# Patient Record
Sex: Female | Born: 1950
Health system: Southern US, Community
[De-identification: ages and names within clinical notes are randomized; demographics above are authoritative.]

## PROBLEM LIST (undated history)

## (undated) DIAGNOSIS — E119 Type 2 diabetes mellitus without complications: Secondary | ICD-10-CM

## (undated) DIAGNOSIS — E785 Hyperlipidemia, unspecified: Secondary | ICD-10-CM

## (undated) DIAGNOSIS — T7840XA Allergy, unspecified, initial encounter: Secondary | ICD-10-CM

## (undated) DIAGNOSIS — I10 Essential (primary) hypertension: Secondary | ICD-10-CM

## (undated) DIAGNOSIS — K219 Gastro-esophageal reflux disease without esophagitis: Secondary | ICD-10-CM

## (undated) DIAGNOSIS — F419 Anxiety disorder, unspecified: Secondary | ICD-10-CM

## (undated) DIAGNOSIS — F32A Depression, unspecified: Secondary | ICD-10-CM

## (undated) HISTORY — DX: Gastro-esophageal reflux disease without esophagitis: K21.9

## (undated) HISTORY — DX: Type 2 diabetes mellitus without complications: E11.9

## (undated) HISTORY — DX: Essential (primary) hypertension: I10

## (undated) HISTORY — PX: EYE SURGERY: SHX253

## (undated) HISTORY — DX: Depression, unspecified: F32.A

## (undated) HISTORY — DX: Hyperlipidemia, unspecified: E78.5

## (undated) HISTORY — DX: Allergy, unspecified, initial encounter: T78.40XA

## (undated) HISTORY — DX: Anxiety disorder, unspecified: F41.9

---

## 1998-08-18 DIAGNOSIS — F324 Major depressive disorder, single episode, in partial remission: Secondary | ICD-10-CM | POA: Insufficient documentation

## 1998-08-18 DIAGNOSIS — F5101 Primary insomnia: Secondary | ICD-10-CM | POA: Insufficient documentation

## 1998-08-18 DIAGNOSIS — I10 Essential (primary) hypertension: Secondary | ICD-10-CM | POA: Insufficient documentation

## 1998-08-18 DIAGNOSIS — E1169 Type 2 diabetes mellitus with other specified complication: Secondary | ICD-10-CM | POA: Insufficient documentation

## 2001-06-17 ENCOUNTER — Other Ambulatory Visit: Admission: RE | Admit: 2001-06-17 | Discharge: 2001-06-17 | Payer: Self-pay | Admitting: Family Medicine

## 2004-09-19 ENCOUNTER — Ambulatory Visit: Payer: Self-pay | Admitting: Family Medicine

## 2005-09-22 ENCOUNTER — Ambulatory Visit: Payer: Self-pay | Admitting: Family Medicine

## 2006-03-04 ENCOUNTER — Ambulatory Visit: Payer: Self-pay | Admitting: Internal Medicine

## 2006-03-05 ENCOUNTER — Ambulatory Visit: Payer: Self-pay | Admitting: Internal Medicine

## 2006-03-09 ENCOUNTER — Ambulatory Visit: Payer: Self-pay

## 2006-10-21 ENCOUNTER — Ambulatory Visit: Payer: Self-pay | Admitting: Family Medicine

## 2008-01-25 ENCOUNTER — Ambulatory Visit: Payer: Self-pay | Admitting: Family Medicine

## 2008-08-18 DIAGNOSIS — M1990 Unspecified osteoarthritis, unspecified site: Secondary | ICD-10-CM

## 2008-08-18 HISTORY — DX: Unspecified osteoarthritis, unspecified site: M19.90

## 2009-04-04 DIAGNOSIS — A6000 Herpesviral infection of urogenital system, unspecified: Secondary | ICD-10-CM | POA: Insufficient documentation

## 2009-04-17 ENCOUNTER — Ambulatory Visit: Payer: Self-pay | Admitting: Family Medicine

## 2010-04-16 ENCOUNTER — Ambulatory Visit: Payer: Self-pay | Admitting: Family Medicine

## 2010-05-07 ENCOUNTER — Ambulatory Visit: Payer: Self-pay | Admitting: Family Medicine

## 2011-06-11 ENCOUNTER — Ambulatory Visit: Payer: Self-pay | Admitting: Family Medicine

## 2011-07-23 ENCOUNTER — Ambulatory Visit: Payer: Self-pay | Admitting: Gastroenterology

## 2011-07-23 LAB — HM COLONOSCOPY

## 2012-06-15 ENCOUNTER — Ambulatory Visit: Payer: Self-pay | Admitting: Family Medicine

## 2012-07-18 HISTORY — PX: HAND SURGERY: SHX662

## 2013-06-29 ENCOUNTER — Ambulatory Visit: Payer: Self-pay | Admitting: Family Medicine

## 2014-07-17 ENCOUNTER — Ambulatory Visit: Payer: Self-pay | Admitting: Family Medicine

## 2014-12-04 ENCOUNTER — Ambulatory Visit
Admit: 2014-12-04 | Disposition: A | Discharge status: Home / Self Care | Payer: Self-pay | Attending: Family Medicine | Admitting: Family Medicine

## 2015-04-30 ENCOUNTER — Telehealth: Payer: Self-pay | Admitting: Family Medicine

## 2015-04-30 DIAGNOSIS — I10 Essential (primary) hypertension: Secondary | ICD-10-CM

## 2015-04-30 DIAGNOSIS — M1990 Unspecified osteoarthritis, unspecified site: Secondary | ICD-10-CM | POA: Insufficient documentation

## 2015-04-30 DIAGNOSIS — K760 Fatty (change of) liver, not elsewhere classified: Secondary | ICD-10-CM | POA: Insufficient documentation

## 2015-04-30 DIAGNOSIS — M19041 Primary osteoarthritis, right hand: Secondary | ICD-10-CM | POA: Insufficient documentation

## 2015-04-30 DIAGNOSIS — R9431 Abnormal electrocardiogram [ECG] [EKG]: Secondary | ICD-10-CM | POA: Insufficient documentation

## 2015-04-30 DIAGNOSIS — N3944 Nocturnal enuresis: Secondary | ICD-10-CM | POA: Insufficient documentation

## 2015-04-30 DIAGNOSIS — E785 Hyperlipidemia, unspecified: Secondary | ICD-10-CM

## 2015-04-30 DIAGNOSIS — B009 Herpesviral infection, unspecified: Secondary | ICD-10-CM | POA: Insufficient documentation

## 2015-04-30 DIAGNOSIS — R32 Unspecified urinary incontinence: Secondary | ICD-10-CM | POA: Insufficient documentation

## 2015-04-30 DIAGNOSIS — F419 Anxiety disorder, unspecified: Secondary | ICD-10-CM | POA: Insufficient documentation

## 2015-04-30 DIAGNOSIS — F32A Depression, unspecified: Secondary | ICD-10-CM

## 2015-04-30 DIAGNOSIS — R319 Hematuria, unspecified: Secondary | ICD-10-CM | POA: Insufficient documentation

## 2015-04-30 DIAGNOSIS — J302 Other seasonal allergic rhinitis: Secondary | ICD-10-CM | POA: Insufficient documentation

## 2015-04-30 DIAGNOSIS — E119 Type 2 diabetes mellitus without complications: Secondary | ICD-10-CM | POA: Insufficient documentation

## 2015-04-30 DIAGNOSIS — F329 Major depressive disorder, single episode, unspecified: Secondary | ICD-10-CM

## 2015-04-30 DIAGNOSIS — R42 Dizziness and giddiness: Secondary | ICD-10-CM | POA: Insufficient documentation

## 2015-04-30 DIAGNOSIS — M19042 Primary osteoarthritis, left hand: Secondary | ICD-10-CM

## 2015-04-30 NOTE — Telephone Encounter (Signed)
Pt is requesting a lab slip to have yearly labs and check vitamin D.  ET#[phone]/[id number]

## 2015-04-30 NOTE — Telephone Encounter (Signed)
Ok to print out labs as previous. Thanks.

## 2015-04-30 NOTE — Telephone Encounter (Signed)
Patient advised to pick up lab slip at front office.  sd

## 2015-05-08 ENCOUNTER — Telehealth: Payer: Self-pay

## 2015-05-08 LAB — COMPREHENSIVE METABOLIC PANEL
A/G RATIO: 1.5 (ref 1.1–2.5)
ALBUMIN: 4.3 g/dL (ref 3.6–4.8)
ALK PHOS: 69 IU/L (ref 39–117)
ALT: 87 IU/L — ABNORMAL HIGH (ref 0–32)
AST: 57 IU/L — ABNORMAL HIGH (ref 0–40)
BILIRUBIN TOTAL: 0.8 mg/dL (ref 0.0–1.2)
BUN / CREAT RATIO: 13 (ref 11–26)
BUN: 11 mg/dL (ref 8–27)
CHLORIDE: 98 mmol/L (ref 97–108)
CO2: 25 mmol/L (ref 18–29)
Calcium: 9.6 mg/dL (ref 8.7–10.3)
Creatinine, Ser: 0.87 mg/dL (ref 0.57–1.00)
GFR calc Af Amer: 82 mL/min/{1.73_m2} (ref >59)
GFR calc non Af Amer: 71 mL/min/{1.73_m2} (ref >59)
GLOBULIN, TOTAL: 2.8 g/dL (ref 1.5–4.5)
Glucose: 109 mg/dL — ABNORMAL HIGH (ref 65–99)
POTASSIUM: 4.1 mmol/L (ref 3.5–5.2)
SODIUM: 139 mmol/L (ref 134–144)
Total Protein: 7.1 g/dL (ref 6.0–8.5)

## 2015-05-08 LAB — CBC WITH DIFFERENTIAL/PLATELET
Basophils Absolute: 0.1 10*3/uL (ref 0.0–0.2)
Basos: 1 %
EOS (ABSOLUTE): 0.3 10*3/uL (ref 0.0–0.4)
EOS: 4 %
HEMATOCRIT: 39.8 % (ref 34.0–46.6)
HEMOGLOBIN: 13.5 g/dL (ref 11.1–15.9)
Immature Grans (Abs): 0 10*3/uL (ref 0.0–0.1)
Immature Granulocytes: 0 %
LYMPHS ABS: 2.8 10*3/uL (ref 0.7–3.1)
Lymphs: 41 %
MCH: 30.1 pg (ref 26.6–33.0)
MCHC: 33.9 g/dL (ref 31.5–35.7)
MCV: 89 fL (ref 79–97)
MONOCYTES: 6 %
MONOS ABS: 0.4 10*3/uL (ref 0.1–0.9)
NEUTROS ABS: 3.3 10*3/uL (ref 1.4–7.0)
Neutrophils: 48 %
Platelets: 296 10*3/uL (ref 150–379)
RBC: 4.48 x10E6/uL (ref 3.77–5.28)
RDW: 13.4 % (ref 12.3–15.4)
WBC: 6.9 10*3/uL (ref 3.4–10.8)

## 2015-05-08 LAB — LIPID PANEL WITH LDL/HDL RATIO
Cholesterol, Total: 151 mg/dL (ref 100–199)
HDL: 51 mg/dL (ref >39)
LDL Calculated: 78 mg/dL (ref 0–99)
LDL/HDL RATIO: 1.5 ratio (ref 0.0–3.2)
Triglycerides: 109 mg/dL (ref 0–149)
VLDL Cholesterol Cal: 22 mg/dL (ref 5–40)

## 2015-05-08 LAB — HEMOGLOBIN A1C
Est. average glucose Bld gHb Est-mCnc: 146 mg/dL
HEMOGLOBIN A1C: 6.7 % — AB (ref 4.8–5.6)

## 2015-05-08 LAB — TSH: TSH: 2.08 u[IU]/mL (ref 0.450–4.500)

## 2015-05-08 LAB — VITAMIN D 25 HYDROXY (VIT D DEFICIENCY, FRACTURES): VIT D 25 HYDROXY: 29.3 ng/mL — AB (ref 30.0–100.0)

## 2015-05-08 NOTE — Telephone Encounter (Signed)
Pt advised; she has an appointment tomorrow.   Thanks,   -Mickel Baas

## 2015-05-08 NOTE — Telephone Encounter (Signed)
-----   Message from Margarita Rana, MD sent at 05/08/2015  7:33 AM EDT ----- Needs ov to address labs. Thanks.

## 2015-05-09 ENCOUNTER — Encounter: Payer: Self-pay | Admitting: Family Medicine

## 2015-05-09 ENCOUNTER — Ambulatory Visit (INDEPENDENT_AMBULATORY_CARE_PROVIDER_SITE_OTHER): Admitting: Family Medicine

## 2015-05-09 VITALS — BP 148/82 | HR 80 | Temp 98.4°F | Resp 16 | Ht 64.25 in | Wt 157.0 lb

## 2015-05-09 DIAGNOSIS — Z23 Encounter for immunization: Secondary | ICD-10-CM

## 2015-05-09 DIAGNOSIS — G4700 Insomnia, unspecified: Secondary | ICD-10-CM | POA: Diagnosis not present

## 2015-05-09 DIAGNOSIS — Z0000 Encounter for general adult medical examination without abnormal findings: Secondary | ICD-10-CM

## 2015-05-09 DIAGNOSIS — F419 Anxiety disorder, unspecified: Secondary | ICD-10-CM | POA: Diagnosis not present

## 2015-05-09 DIAGNOSIS — K219 Gastro-esophageal reflux disease without esophagitis: Secondary | ICD-10-CM | POA: Diagnosis not present

## 2015-05-09 DIAGNOSIS — E119 Type 2 diabetes mellitus without complications: Secondary | ICD-10-CM

## 2015-05-09 DIAGNOSIS — A6000 Herpesviral infection of urogenital system, unspecified: Secondary | ICD-10-CM | POA: Diagnosis not present

## 2015-05-09 DIAGNOSIS — I10 Essential (primary) hypertension: Secondary | ICD-10-CM

## 2015-05-09 DIAGNOSIS — R748 Abnormal levels of other serum enzymes: Secondary | ICD-10-CM | POA: Diagnosis not present

## 2015-05-09 DIAGNOSIS — E785 Hyperlipidemia, unspecified: Secondary | ICD-10-CM

## 2015-05-09 LAB — POCT UA - MICROALBUMIN: Microalbumin Ur, POC: 20 mg/L

## 2015-05-09 LAB — POCT URINALYSIS DIPSTICK
Bilirubin, UA: NEGATIVE
GLUCOSE UA: NEGATIVE
Ketones, UA: NEGATIVE
LEUKOCYTES UA: NEGATIVE
NITRITE UA: NEGATIVE
Spec Grav, UA: 1.01
UROBILINOGEN UA: 0.2
pH, UA: 7

## 2015-05-09 MED ORDER — CYCLOBENZAPRINE HCL 5 MG PO TABS: 5.0000 mg | ORAL_TABLET | Freq: Three times a day (TID) | ORAL | Status: DC | PRN

## 2015-05-09 MED ORDER — SITAGLIPTIN PHOSPHATE 100 MG PO TABS: 100.0000 mg | ORAL_TABLET | Freq: Every day | ORAL | Status: DC

## 2015-05-09 MED ORDER — ATENOLOL 50 MG PO TABS
50.0000 mg | ORAL_TABLET | Freq: Every day | ORAL | Status: DC
Start: 2015-05-09 — End: 2016-04-29

## 2015-05-09 MED ORDER — ACYCLOVIR 200 MG PO CAPS: 200.0000 mg | ORAL_CAPSULE | Freq: Every day | ORAL | Status: DC

## 2015-05-09 MED ORDER — OMEPRAZOLE 20 MG PO TBEC: 20.0000 mg | DELAYED_RELEASE_TABLET | Freq: Every day | ORAL | Status: DC

## 2015-05-09 MED ORDER — HYDROCHLOROTHIAZIDE 12.5 MG PO TABS: 12.5000 mg | ORAL_TABLET | Freq: Every day | ORAL | Status: DC

## 2015-05-09 MED ORDER — ATORVASTATIN CALCIUM 10 MG PO TABS: 10.0000 mg | ORAL_TABLET | Freq: Every day | ORAL | Status: DC

## 2015-05-09 MED ORDER — VALSARTAN 80 MG PO TABS
80.0000 mg | ORAL_TABLET | Freq: Every day | ORAL | Status: DC
Start: 2015-05-09 — End: 2016-04-29

## 2015-05-09 MED ORDER — PAROXETINE HCL 20 MG PO TABS: 20.0000 mg | ORAL_TABLET | Freq: Every day | ORAL | Status: DC

## 2015-05-09 MED ORDER — METFORMIN HCL 500 MG PO TABS: 500.0000 mg | ORAL_TABLET | Freq: Two times a day (BID) | ORAL | Status: DC

## 2015-05-09 NOTE — Progress Notes (Signed)
Patient: Jenna Stein, Female    DOB: [birth year], 64 y.o.   MRN: [id number] Visit Date: 05/09/2015  Today's Maurice Ramseur: Margarita Rana, MD   Chief Complaint  Patient presents with  . Annual Exam  . Elevated Hepatic Enzymes  . Diabetes   Subjective:    Annual physical exam Jenna Stein is a 64 y.o. female who presents today for health maintenance and complete physical. She feels well. She reports exercising 3 times weekly. Walks on treadmill for 1 hour. She reports she is sleeping poorly.  ----------------------------------------------------------------- Last CPE- 05/03/2014 Last Pap- 04/23/2012- WNL, HPV neg Last Mammo- 07/17/2014- BI-RADS 1 Last Colon- 07/23/2011- Normal Last BMD- 06/24/2010 Last Tdap- 04/18/2011 Last Zoster- 01/12/2013 Last EKG- 05/28/2011    Diabetes Mellitus Type II, Follow-up:   Lab Results  Component Value Date   HGBA1C 6.7* 05/07/2015    She reports good compliance with treatment. She is not having side effects.  Current symptoms include none and have been unchanged. Home blood sugar records: not being checked  Episodes of hypoglycemia? no   Pertinent Labs:    Component Value Date/Time   CHOL 151 05/07/2015 1115   TRIG 109 05/07/2015 1115   CREATININE 0.87 05/07/2015 1115    Wt Readings from Last 3 Encounters:  05/09/15 157 lb (71.215 kg)    ------------------------------------------------------------------------  Elevated LFT's Pt just had labs checked on 05/07/2015 and had elevated LFT's. Pt denies alcohol use or Tylenol use. Has remained stable.  Taking herbs.  For  Sleep, stopped Clonazepam secondary to urinating in her sleep. Does take Cyclobenzaprine that is her husband's to help her sleep. Would like her own prescription.      Review of Systems  Constitutional: Negative.   HENT: Negative.   Eyes: Negative.   Respiratory: Negative.   Cardiovascular: Negative.   Gastrointestinal: Negative.   Endocrine:  Negative.   Genitourinary: Negative.   Musculoskeletal: Negative.   Skin: Negative.   Allergic/Immunologic: Negative.   Neurological: Negative.   Hematological: Negative.   Psychiatric/Behavioral: Positive for sleep disturbance. Negative for suicidal ideas, hallucinations, behavioral problems, confusion, self-injury, dysphoric mood, decreased concentration and agitation. The patient is not nervous/anxious and is not hyperactive.     Social History She  reports that she has never smoked. She has never used smokeless tobacco. She reports that she does not drink alcohol or use illicit drugs. Social History   Social History  . Marital Status: Married    Spouse Name: N/A  . Number of Children: N/A  . Years of Education: N/A   Social History Main Topics  . Smoking status: Never Smoker   . Smokeless tobacco: Never Used  . Alcohol Use: No  . Drug Use: No  . Sexual Activity: Not Asked   Other Topics Concern  . None   Social History Narrative    Patient Active Problem List   Diagnosis Date Noted  . Abnormal ECG 04/30/2015  . Allergic rhinitis, seasonal 04/30/2015  . Anxiety 04/30/2015  . Arthritis 04/30/2015  . Diabetes 04/30/2015  . Abnormal liver enzymes 04/30/2015  . Bed wetting 04/30/2015  . Acid reflux 04/30/2015  . Arthralgia of hand 04/30/2015  . Blood in the urine 04/30/2015  . Herpes 04/30/2015  . Absence of bladder continence 04/30/2015  . Head revolving around 04/30/2015  . Genital herpes 04/04/2009  . Bone/cartilage disorder 08/04/2003  . Arthropathia 08/18/2002  . Clinical depression 08/18/1998  . BP (high blood pressure) 08/18/1998  . HLD (hyperlipidemia) 08/18/1998  .  Cannot sleep 08/18/1998    Past Surgical History  Procedure Laterality Date  . Hand surgery  07/2012    Family History  Family Status  Relation Status Death Age  . Mother Deceased 71  . Father Deceased 90   Her family history includes Arthritis in her mother and other;  Hyperlipidemia in her mother and other; Hypertension in her mother and other; Hypothyroidism in her other; Parkinsonism in her father.    Allergies  Allergen Reactions  . Meloxicam   . Trazodone     Previous Medications   CALCIUM CARBONATE-VITAMIN D 600-200 MG-UNIT CAPS       CHOLECALCIFEROL (VITAMIN D HIGH POTENCY) 1000 UNITS CAPSULE    Take by mouth.   CLONAZEPAM (KLONOPIN) 0.5 MG TABLET    Take by mouth.   COENZYME Q10 (CO Q 10) 100 MG CAPS    Take by mouth.   FEXOFENADINE (ALLEGRA ALLERGY) 60 MG TABLET    Take by mouth.   FLAXSEED, LINSEED, PO    Take by mouth.   LECITHIN 1200 MG CAPS    Take by mouth.   LORATADINE (CLARITIN) 10 MG TABLET    Take by mouth.   LUTEIN 20 MG TABS    Take by mouth.   MONTELUKAST (SINGULAIR) 10 MG TABLET    Take by mouth.   MULTIPLE VITAMIN PO       OLOPATADINE (PATANOL) 0.1 % OPHTHALMIC SOLUTION    Apply to eye.    Patient Care Team: Margarita Rana, MD as PCP - General (Family Medicine)     Objective:   Vitals: BP 148/82 mmHg  Pulse 80  Temp(Src) 98.4 F (36.9 C) (Oral)  Resp 16  Ht 5' 4.25" (1.632 m)  Wt 157 lb (71.215 kg)  BMI 26.74 kg/m2   Physical Exam  Constitutional: She is oriented to person, place, and time. She appears well-developed and well-nourished.  HENT:  Head: Normocephalic and atraumatic.  Right Ear: Tympanic membrane, external ear and ear canal normal.  Left Ear: Tympanic membrane, external ear and ear canal normal.  Nose: Nose normal.  Mouth/Throat: Uvula is midline, oropharynx is clear and moist and mucous membranes are normal.  Eyes: Conjunctivae, EOM and lids are normal. Pupils are equal, round, and reactive to light.  Neck: Trachea normal and normal range of motion. Neck supple. Carotid bruit is not present. No thyroid mass and no thyromegaly present.  Cardiovascular: Normal rate, regular rhythm and normal heart sounds.   Pulmonary/Chest: Effort normal and breath sounds normal.  Abdominal: Soft. Normal appearance  and bowel sounds are normal. There is no hepatosplenomegaly. There is no tenderness.  Genitourinary: No breast swelling, tenderness or discharge.  Musculoskeletal: Normal range of motion.  Lymphadenopathy:    She has no cervical adenopathy.    She has no axillary adenopathy.  Neurological: She is alert and oriented to person, place, and time. She has normal strength. No cranial nerve deficit.  Skin: Skin is warm, dry and intact.  Psychiatric: She has a normal mood and affect. Her speech is normal and behavior is normal. Judgment and thought content normal. Cognition and memory are normal.     Depression Screen No flowsheet data found.    Assessment & Plan:     Routine Health Maintenance and Physical Exam  Exercise Activities and Dietary recommendations Goals    None      Immunization History  Administered Date(s) Administered  . Influenza,inj,Quad PF,36+ Mos 05/09/2015  . Td 08/04/2003  . Tdap 04/18/2011  .  Zoster 01/12/2013    Health Maintenance  Topic Date Due  . Hepatitis C Screening  Dec 28, 1950  . PNEUMOCOCCAL POLYSACCHARIDE VACCINE (1) 06/28/1953  . FOOT EXAM  06/28/1961  . OPHTHALMOLOGY EXAM  06/28/1961  . URINE MICROALBUMIN  06/28/1961  . HIV Screening  06/28/1966  . TETANUS/TDAP  06/28/1970  . PAP SMEAR  06/28/1972  . MAMMOGRAM  06/28/2001  . COLONOSCOPY  06/28/2001  . ZOSTAVAX  06/29/2011  . INFLUENZA VACCINE  03/19/2015  . HEMOGLOBIN A1C  11/04/2015      Discussed health benefits of physical activity, and encouraged her to engage in regular exercise appropriate for her age and condition.   2. Essential hypertension Stable. Up today. Continue current medication and plan of care. Monitor. May need medication adjustment.   - hydrochlorothiazide (HYDRODIURIL) 12.5 MG tablet; Take 1 tablet (12.5 mg total) by mouth daily.  Dispense: 90 tablet; Refill: 3 - valsartan (DIOVAN) 80 MG tablet; Take 1 tablet (80 mg total) by mouth daily.  Dispense: 90 tablet;  Refill: 3 - atenolol (TENORMIN) 50 MG tablet; Take 1 tablet (50 mg total) by mouth daily.  Dispense: 90 tablet; Refill: 3  3. Type 2 diabetes mellitus without complication Stable. Continue to work on diet and exercise. Continue medication.   - POCT UA - Microalbumin - POCT urinalysis dipstick - metFORMIN (GLUCOPHAGE) 500 MG tablet; Take 1 tablet (500 mg total) by mouth 2 (two) times daily with a meal.  Dispense: 180 tablet; Refill: 3 - sitaGLIPtin (JANUVIA) 100 MG tablet; Take 1 tablet (100 mg total) by mouth daily.  Dispense: 90 tablet; Refill: 3  4. Abnormal liver enzymes Unchanged. Has had previous work up.    5. Flu vaccine need - Flu Vaccine QUAD 36+ mos IM  6. Cannot sleep Will try cyclobenzaprine.  - cyclobenzaprine (FLEXERIL) 5 MG tablet; Take 1 tablet (5 mg total) by mouth 3 (three) times daily as needed for muscle spasms.  Dispense: 270 tablet; Refill: 3  7. Genital herpes Stable.   - acyclovir (ZOVIRAX) 200 MG capsule; Take 1 capsule (200 mg total) by mouth 5 (five) times daily.  Dispense: 90 capsule; Refill: 3  8. Gastroesophageal reflux disease, esophagitis presence not specified Stable. Continue mediation.   - Omeprazole 20 MG TBEC; Take 1 tablet (20 mg total) by mouth daily.  Dispense: 90 each; Refill: 3  9. HLD (hyperlipidemia) Stable. Continue medication.  - atorvastatin (LIPITOR) 10 MG tablet; Take 1 tablet (10 mg total) by mouth at bedtime.  Dispense: 90 tablet; Refill: 3  10. Anxiety Stable. Continue medication.   - PARoxetine (PAXIL) 20 MG tablet; Take 1 tablet (20 mg total) by mouth daily.  Dispense: 90 tablet; Refill: 3  Patient was seen and examined by Jerrell Belfast, MD, and note scribed by Renaldo Fiddler, CMA.  I have reviewed the document for accuracy and completeness and I agree with above. Jerrell Belfast, MD  Margarita Rana, MD

## 2015-05-09 NOTE — Addendum Note (Signed)
Addended by: Jerrell Belfast on: 05/09/2015 08:31 PM   Modules accepted: Miquel Dunn

## 2015-07-06 ENCOUNTER — Other Ambulatory Visit: Payer: Self-pay | Admitting: Family Medicine

## 2015-07-06 DIAGNOSIS — Z1231 Encounter for screening mammogram for malignant neoplasm of breast: Secondary | ICD-10-CM

## 2015-08-08 ENCOUNTER — Encounter: Admitting: Family Medicine

## 2015-08-19 HISTORY — PX: CYST EXCISION: SHX5701

## 2015-08-23 ENCOUNTER — Ambulatory Visit
Admission: RE | Admit: 2015-08-23 | Discharge: 2015-08-23 | Disposition: A | Discharge status: Home / Self Care | Source: Ambulatory Visit | Attending: Family Medicine | Admitting: Family Medicine

## 2015-08-23 DIAGNOSIS — Z1231 Encounter for screening mammogram for malignant neoplasm of breast: Secondary | ICD-10-CM | POA: Diagnosis present

## 2015-08-29 ENCOUNTER — Telehealth: Payer: Self-pay | Admitting: Family Medicine

## 2015-08-29 DIAGNOSIS — I10 Essential (primary) hypertension: Secondary | ICD-10-CM

## 2015-08-29 DIAGNOSIS — E119 Type 2 diabetes mellitus without complications: Secondary | ICD-10-CM

## 2015-08-29 NOTE — Telephone Encounter (Signed)
Ok to order labs. Thanks.

## 2015-08-29 NOTE — Telephone Encounter (Signed)
Lab sheet at the front desk; pt advised.   Thanks,   -Quang Thorpe  

## 2015-08-29 NOTE — Telephone Encounter (Signed)
Pt is coming to see you 09/04/14 and she needs to get her labs done before her visit.  Can we get a lab order ready for her to pick up before Friday.    Her call back is [phone]  Thanks teri

## 2015-09-04 LAB — COMPREHENSIVE METABOLIC PANEL
A/G RATIO: 1.8 (ref 1.1–2.5)
ALT: 85 IU/L — AB (ref 0–32)
AST: 51 IU/L — ABNORMAL HIGH (ref 0–40)
Albumin: 4.6 g/dL (ref 3.6–4.8)
Alkaline Phosphatase: 68 IU/L (ref 39–117)
BUN/Creatinine Ratio: 15 (ref 11–26)
BUN: 13 mg/dL (ref 8–27)
Bilirubin Total: 0.9 mg/dL (ref 0.0–1.2)
CALCIUM: 10 mg/dL (ref 8.7–10.3)
CO2: 23 mmol/L (ref 18–29)
Chloride: 98 mmol/L (ref 96–106)
Creatinine, Ser: 0.86 mg/dL (ref 0.57–1.00)
GFR, EST AFRICAN AMERICAN: 83 mL/min/{1.73_m2} (ref >59)
GFR, EST NON AFRICAN AMERICAN: 72 mL/min/{1.73_m2} (ref >59)
GLUCOSE: 124 mg/dL — AB (ref 65–99)
Globulin, Total: 2.6 g/dL (ref 1.5–4.5)
Potassium: 4 mmol/L (ref 3.5–5.2)
Sodium: 139 mmol/L (ref 134–144)
TOTAL PROTEIN: 7.2 g/dL (ref 6.0–8.5)

## 2015-09-04 LAB — HEMOGLOBIN A1C
Est. average glucose Bld gHb Est-mCnc: 148 mg/dL
Hgb A1c MFr Bld: 6.8 % — ABNORMAL HIGH (ref 4.8–5.6)

## 2015-09-05 ENCOUNTER — Ambulatory Visit (INDEPENDENT_AMBULATORY_CARE_PROVIDER_SITE_OTHER): Admitting: Family Medicine

## 2015-09-05 ENCOUNTER — Encounter: Payer: Self-pay | Admitting: Family Medicine

## 2015-09-05 VITALS — BP 102/60 | HR 72 | Temp 98.2°F | Resp 16 | Ht 64.0 in | Wt 157.0 lb

## 2015-09-05 DIAGNOSIS — I10 Essential (primary) hypertension: Secondary | ICD-10-CM

## 2015-09-05 DIAGNOSIS — E119 Type 2 diabetes mellitus without complications: Secondary | ICD-10-CM | POA: Diagnosis not present

## 2015-09-05 DIAGNOSIS — G4700 Insomnia, unspecified: Secondary | ICD-10-CM | POA: Diagnosis not present

## 2015-09-05 DIAGNOSIS — R748 Abnormal levels of other serum enzymes: Secondary | ICD-10-CM

## 2015-09-05 MED ORDER — VITAMIN E 180 MG (400 UNIT) PO CAPS: 400.0000 [IU] | ORAL_CAPSULE | Freq: Every day | ORAL | Status: AC

## 2015-09-05 MED ORDER — METFORMIN HCL 1000 MG PO TABS: 1000.0000 mg | ORAL_TABLET | Freq: Two times a day (BID) | ORAL | Status: DC

## 2015-09-05 NOTE — Progress Notes (Signed)
Patient ID: Jenna Stein, female   DOB: [birth year], 65 y.o.   MRN: [id number]       Patient: Jenna Stein Female    DOB: [birth year]   65 y.o.   MRN: [id number] Visit Date: 09/05/2015  Today's Provider: Margarita Rana, MD   Chief Complaint  Patient presents with  . Diabetes  . Hypertension  . Insomnia   Subjective:    HPI  Diabetes Mellitus Type II, Follow-up:   Lab Results  Component Value Date   HGBA1C 6.8* 09/03/2015   HGBA1C 6.7* 05/07/2015    Last seen for diabetes 3 months ago.  Management since then includes labs ordered. She reports excellent compliance with treatment. She is not having side effects.  Current symptoms include none and have been stable. Home blood sugar records: not being checked  Episodes of hypoglycemia? no   Most Recent Eye Exam: 08/2014 [facility] Weight trend: stable Prior visit with dietician: no Current diet: in general, a "healthy" diet   Current exercise: bicycling  Pertinent Labs:    Component Value Date/Time   CHOL 151 05/07/2015 1115   TRIG 109 05/07/2015 1115   HDL 51 05/07/2015 1115   LDLCALC 78 05/07/2015 1115   CREATININE 0.86 09/03/2015 1119    Wt Readings from Last 3 Encounters:  09/05/15 157 lb (71.215 kg)  05/09/15 157 lb (71.215 kg)    ------------------------------------------------------------------------    Hypertension, follow-up:  BP Readings from Last 3 Encounters:  09/05/15 102/60  05/09/15 148/82    She was last seen for hypertension 3 months ago.  BP at that visit was 148/82. Management changes since that visit include no changes. She reports excellent compliance with treatment. She is not having side effects.  She is exercising. She is adherent to low salt diet.   Outside blood pressures are stable. She is experiencing none.  Patient denies none.   Cardiovascular risk factors include diabetes mellitus.  Use of agents associated with hypertension: none.     ------------------------------------------------------------------------   Follow up for Insomnia  The patient was last seen for this 3 months ago. Changes made at last visit include started Flexeril.  She reports excellent compliance with treatment. She feels that condition is Improved. She is not having side effects. Patient reports she is stopped using flexeril daily, and is using a device to help her sleep better.   ------------------------------------------------------------------------------------     Allergies  Allergen Reactions  . Meloxicam   . Trazodone    Previous Medications   ACYCLOVIR (ZOVIRAX) 200 MG CAPSULE    Take 1 capsule (200 mg total) by mouth 5 (five) times daily.   ATENOLOL (TENORMIN) 50 MG TABLET    Take 1 tablet (50 mg total) by mouth daily.   ATORVASTATIN (LIPITOR) 10 MG TABLET    Take 1 tablet (10 mg total) by mouth at bedtime.   CALCIUM CARBONATE-VITAMIN D 600-200 MG-UNIT CAPS    Take 1 capsule by mouth daily.    CHOLECALCIFEROL (VITAMIN D HIGH POTENCY) 1000 UNITS CAPSULE    Take 1,000 Units by mouth daily.    COENZYME Q10 (CO Q 10) 100 MG CAPS    Take 1 capsule by mouth daily.    CYCLOBENZAPRINE (FLEXERIL) 5 MG TABLET    Take 1 tablet (5 mg total) by mouth 3 (three) times daily as needed for muscle spasms.   HYDROCHLOROTHIAZIDE (HYDRODIURIL) 12.5 MG TABLET    Take 1 tablet (12.5 mg total) by mouth daily.   LECITHIN 1200 MG CAPS  Take 1 capsule by mouth daily.    LORATADINE (CLARITIN) 10 MG TABLET    Take 10 mg by mouth daily as needed.    LUTEIN 20 MG TABS    Take 1 tablet by mouth daily.    METFORMIN (GLUCOPHAGE) 500 MG TABLET    Take 1 tablet (500 mg total) by mouth 2 (two) times daily with a meal.   MONTELUKAST (SINGULAIR) 10 MG TABLET    Take 10 mg by mouth.    MULTIPLE VITAMIN PO    1 tablet daily.    OLOPATADINE (PATANOL) 0.1 % OPHTHALMIC SOLUTION    Apply to eye.   OMEPRAZOLE 20 MG TBEC    Take 1 tablet (20 mg total) by mouth daily.    PAROXETINE (PAXIL) 20 MG TABLET    Take 1 tablet (20 mg total) by mouth daily.   SITAGLIPTIN (JANUVIA) 100 MG TABLET    Take 1 tablet (100 mg total) by mouth daily.   VALSARTAN (DIOVAN) 80 MG TABLET    Take 1 tablet (80 mg total) by mouth daily.    Review of Systems  Constitutional: Negative.   Cardiovascular: Negative.   Endocrine: Negative.     Social History  Substance Use Topics  . Smoking status: Never Smoker   . Smokeless tobacco: Never Used  . Alcohol Use: No   Objective:   BP 102/60 mmHg  Pulse 72  Temp(Src) 98.2 F (36.8 C)  Resp 16  Ht 5\' 4"  (1.626 m)  Wt 157 lb (71.215 kg)  BMI 26.94 kg/m2  SpO2 97%  Physical Exam  Constitutional: She is oriented to person, place, and time. She appears well-developed and well-nourished.  Cardiovascular: Normal rate and regular rhythm.   Pulmonary/Chest: Effort normal and breath sounds normal.  Neurological: She is alert and oriented to person, place, and time.  Psychiatric: She has a normal mood and affect. Her behavior is normal. Judgment and thought content normal.      Assessment & Plan:     1. Type 2 diabetes mellitus without complication, without long-term current use of insulin (HCC) Not at goal, but has really tried to adjust lifestyle. Will increase medication. Recheck in 3 months.   - metFORMIN (GLUCOPHAGE) 1000 MG tablet; Take 1 tablet (1,000 mg total) by mouth 2 (two) times daily with a meal.  Dispense: 180 tablet; Refill: 3 Results for orders placed or performed in visit on 08/29/15  Comprehensive metabolic panel  Result Value Ref Range   Glucose 124 (H) 65 - 99 mg/dL   BUN 13 8 - 27 mg/dL   Creatinine, Ser 0.86 0.57 - 1.00 mg/dL   GFR calc non Af Amer 72 >59 mL/min/1.73   GFR calc Af Amer 83 >59 mL/min/1.73   BUN/Creatinine Ratio 15 11 - 26   Sodium 139 134 - 144 mmol/L   Potassium 4.0 3.5 - 5.2 mmol/L   Chloride 98 96 - 106 mmol/L   CO2 23 18 - 29 mmol/L   Calcium 10.0 8.7 - 10.3 mg/dL   Total Protein  7.2 6.0 - 8.5 g/dL   Albumin 4.6 3.6 - 4.8 g/dL   Globulin, Total 2.6 1.5 - 4.5 g/dL   Albumin/Globulin Ratio 1.8 1.1 - 2.5   Bilirubin Total 0.9 0.0 - 1.2 mg/dL   Alkaline Phosphatase 68 39 - 117 IU/L   AST 51 (H) 0 - 40 IU/L   ALT 85 (H) 0 - 32 IU/L  Hemoglobin A1c  Result Value Ref Range   Hgb A1c  MFr Bld 6.8 (H) 4.8 - 5.6 %   Est. average glucose Bld gHb Est-mCnc 148 mg/dL    2. Essential hypertension Condition is stable. Please continue current medication and  plan of care as noted.    3. Insomnia Continue sleep device.   Recheck in 3 months.    4. Abnormal liver enzymes Continue Vitamin E.   Continue to monitor.   - vitamin E (VITAMIN E) 400 UNIT capsule; Take 1 capsule (400 Units total) by mouth daily.  Dispense: 1 capsule; Refill: 0     Patient was seen and examined by Jerrell Belfast, MD, and note scribed by Lynford Humphrey, [place].   I have reviewed the document for accuracy and completeness and I agree with above. - Jerrell Belfast, MD   Margarita Rana, MD  [facility]

## 2015-09-13 ENCOUNTER — Encounter: Payer: Self-pay | Admitting: Family Medicine

## 2015-12-19 ENCOUNTER — Ambulatory Visit: Admitting: Family Medicine

## 2015-12-26 ENCOUNTER — Ambulatory Visit (INDEPENDENT_AMBULATORY_CARE_PROVIDER_SITE_OTHER): Admitting: Internal Medicine

## 2015-12-26 ENCOUNTER — Encounter: Payer: Self-pay | Admitting: Internal Medicine

## 2015-12-26 VITALS — BP 124/70 | HR 62 | Ht 64.0 in | Wt 158.0 lb

## 2015-12-26 DIAGNOSIS — E119 Type 2 diabetes mellitus without complications: Secondary | ICD-10-CM | POA: Diagnosis not present

## 2015-12-26 DIAGNOSIS — E785 Hyperlipidemia, unspecified: Secondary | ICD-10-CM

## 2015-12-26 DIAGNOSIS — I10 Essential (primary) hypertension: Secondary | ICD-10-CM

## 2015-12-26 DIAGNOSIS — D1720 Benign lipomatous neoplasm of skin and subcutaneous tissue of unspecified limb: Secondary | ICD-10-CM

## 2015-12-26 DIAGNOSIS — R748 Abnormal levels of other serum enzymes: Secondary | ICD-10-CM | POA: Diagnosis not present

## 2015-12-26 NOTE — Progress Notes (Signed)
Date:  12/26/2015   Name:  Jenna Stein   DOB:  1951/08/12   MRN:  [id number]   Chief Complaint: Establish Care Hypertension This is a chronic problem. The current episode started more than 1 year ago. The problem is unchanged. The problem is controlled. Pertinent negatives include no chest pain, headaches, palpitations or shortness of breath.  Diabetes She presents for her follow-up diabetic visit. She has type 2 diabetes mellitus. Her disease course has been stable (onset about 3 years ago). Pertinent negatives for hypoglycemia include no confusion, dizziness, headaches, nervousness/anxiousness or tremors. Pertinent negatives for diabetes include no chest pain, no fatigue, no polydipsia, no polyuria and no weakness. Symptoms are stable. Her weight is stable. She is following a generally healthy diet. She has not had a previous visit with a dietitian. She participates in exercise daily. Frequency home blood tests: does not test blood sugar.  Insomnia Primary symptoms: sleep disturbance.  The problem occurs intermittently. PMH includes: depression.  Depression        This is a chronic problem.  The current episode started more than 1 year ago.   The onset quality is undetermined.   Associated symptoms include insomnia.  Associated symptoms include no fatigue, no appetite change and no headaches.  Compliance with treatment is good.  Previous treatment provided significant relief.  Risk factors: hx of work related stress.  Gastroesophageal Reflux She complains of heartburn. She reports no abdominal pain, no chest pain or no coughing. This is a chronic problem. The problem occurs rarely. The problem has been unchanged. Pertinent negatives include no fatigue. There are no known risk factors. She has tried a PPI for the symptoms.  Hyperlipidemia This is a chronic problem. The current episode started more than 1 year ago. The problem is controlled. Recent lipid tests were reviewed and are normal.  Pertinent negatives include no chest pain or shortness of breath. Current antihyperlipidemic treatment includes statins. Risk factors for coronary artery disease include diabetes mellitus and dyslipidemia.   Herpes - oral and on buttock.  She takes acyclovir as needed for 5 days with a flare.  Lab Results  Component Value Date   HGBA1C 6.8* 09/03/2015    Review of Systems  Constitutional: Negative for fever, appetite change, fatigue and unexpected weight change.  HENT: Negative for tinnitus and trouble swallowing.   Eyes: Negative for visual disturbance.  Respiratory: Negative for cough, chest tightness and shortness of breath.   Cardiovascular: Negative for chest pain, palpitations and leg swelling.  Gastrointestinal: Positive for heartburn. Negative for abdominal pain, diarrhea and constipation.  Endocrine: Negative for polydipsia and polyuria.  Genitourinary: Negative for dysuria, hematuria, vaginal bleeding and vaginal discharge.  Musculoskeletal: Positive for arthralgias (hands and knees).  Skin: Negative for color change and rash.  Allergic/Immunologic: Positive for environmental allergies.  Neurological: Negative for dizziness, tremors, weakness, numbness and headaches.  Hematological: Negative for adenopathy.  Psychiatric/Behavioral: Positive for depression and sleep disturbance. Negative for confusion and dysphoric mood. The patient has insomnia. The patient is not nervous/anxious.     Patient Active Problem List   Diagnosis Date Noted  . Abnormal ECG 04/30/2015  . Allergic rhinitis, seasonal 04/30/2015  . Anxiety 04/30/2015  . Arthritis 04/30/2015  . Controlled diabetes mellitus ([place]) 04/30/2015  . Abnormal liver enzymes 04/30/2015  . Acid reflux 04/30/2015  . Arthralgia of hand 04/30/2015  . Blood in the urine 04/30/2015  . Genital herpes 04/04/2009  . Bone/cartilage disorder 08/04/2003  . Clinical depression 08/18/1998  .  Hypertension 08/18/1998  . Hyperlipidemia  08/18/1998  . Insomnia 08/18/1998    Prior to Admission medications   Medication Sig Start Date End Date Taking? Authorizing Provider  acyclovir (ZOVIRAX) 200 MG capsule Take 1 capsule (200 mg total) by mouth 5 (five) times daily. 05/09/15  Yes Margarita Rana, MD  atenolol (TENORMIN) 50 MG tablet Take 1 tablet (50 mg total) by mouth daily. 05/09/15  Yes Margarita Rana, MD  atorvastatin (LIPITOR) 10 MG tablet Take 1 tablet (10 mg total) by mouth at bedtime. 05/09/15  Yes Margarita Rana, MD  Calcium Carbonate-Vitamin D 600-200 MG-UNIT CAPS Take 1 capsule by mouth daily.  01/09/09  Yes Historical Provider, MD  Cholecalciferol (VITAMIN D HIGH POTENCY) 1000 UNITS capsule Take 1,000 Units by mouth daily.    Yes Historical Provider, MD  Coenzyme Q10 (CO Q 10) 100 MG CAPS Take 1 capsule by mouth daily.    Yes Historical Provider, MD  cyclobenzaprine (FLEXERIL) 5 MG tablet Take 1 tablet (5 mg total) by mouth 3 (three) times daily as needed for muscle spasms. 05/09/15  Yes Margarita Rana, MD  hydrochlorothiazide (HYDRODIURIL) 12.5 MG tablet Take 1 tablet (12.5 mg total) by mouth daily. 05/09/15  Yes Margarita Rana, MD  Lecithin 1200 MG CAPS Take 1 capsule by mouth daily.    Yes Historical Provider, MD  loratadine (CLARITIN) 10 MG tablet Take 10 mg by mouth daily as needed.  11/21/13  Yes Historical Provider, MD  Lutein 20 MG TABS Take 1 tablet by mouth daily.    Yes Historical Provider, MD  metFORMIN (GLUCOPHAGE) 1000 MG tablet Take 1 tablet (1,000 mg total) by mouth 2 (two) times daily with a meal. 09/05/15  Yes Margarita Rana, MD  MULTIPLE VITAMIN PO 1 tablet daily.  04/04/09  Yes Historical Provider, MD  olopatadine (PATANOL) 0.1 % ophthalmic solution Apply to eye.   Yes Historical Provider, MD  Omeprazole 20 MG TBEC Take 1 tablet (20 mg total) by mouth daily. 05/09/15  Yes Margarita Rana, MD  PARoxetine (PAXIL) 20 MG tablet Take 1 tablet (20 mg total) by mouth daily. 05/09/15  Yes Margarita Rana, MD  sitaGLIPtin  (JANUVIA) 100 MG tablet Take 1 tablet (100 mg total) by mouth daily. 05/09/15  Yes Margarita Rana, MD  valsartan (DIOVAN) 80 MG tablet Take 1 tablet (80 mg total) by mouth daily. 05/09/15  Yes Margarita Rana, MD  vitamin E (VITAMIN E) 400 UNIT capsule Take 1 capsule (400 Units total) by mouth daily. 09/05/15  Yes Margarita Rana, MD  montelukast (SINGULAIR) 10 MG tablet Take 10 mg by mouth. Reported on 12/26/2015 12/05/14   Historical Provider, MD    Allergies  Allergen Reactions  . Clonazepam     Nocturnal enuresis  . Meloxicam   . Trazodone     Past Surgical History  Procedure Laterality Date  . Hand surgery  07/2012    Social History  Substance Use Topics  . Smoking status: Never Smoker   . Smokeless tobacco: Never Used  . Alcohol Use: No    Medication list has been reviewed and updated.  Physical Exam  Constitutional: She is oriented to person, place, and time. She appears well-developed. No distress.  HENT:  Head: Normocephalic and atraumatic.  Eyes: Conjunctivae and EOM are normal. Pupils are equal, round, and reactive to light.  Neck: Normal range of motion. Neck supple. Carotid bruit is not present. No thyromegaly present.  Cardiovascular: Normal rate, regular rhythm and normal heart sounds.   No murmur heard. Pulmonary/Chest:  Effort normal and breath sounds normal. No respiratory distress. She has no wheezes. She has no rales.  Musculoskeletal:       Right knee: She exhibits decreased range of motion. She exhibits no effusion.       Left knee: She exhibits decreased range of motion. She exhibits no effusion.  Bouchard's nodes of both hands  Lymphadenopathy:    She has no cervical adenopathy.  Neurological: She is alert and oriented to person, place, and time. She has normal reflexes.  Skin: Skin is warm and dry. No rash noted.     Foot exam - normal skin, pulses, sensation and nails.  Psychiatric: She has a normal mood and affect. Her speech is normal and behavior is  normal. Thought content normal.  Nursing note and vitals reviewed.   BP 124/70 mmHg  Pulse 62  Ht 5\' 4"  (1.626 m)  Wt 158 lb (71.668 kg)  BMI 27.11 kg/m2  Assessment and Plan: 1. Lipoma of arm - Ambulatory referral to General Surgery  2. Essential hypertension controlled - CBC with Differential/Platelet; Future - TSH; Future  3. Controlled type 2 diabetes mellitus without complication, without long-term current use of insulin ([place]) Continue oral agents Consider starting home FSBS testing - Hemoglobin A1c - Comprehensive metabolic panel; Future - Hemoglobin A1c; Future - Microalbumin / creatinine urine ratio; Future  4. Hyperlipidemia On statin therapy - Lipid panel; Future  5. Abnormal liver enzymes Hx of fatty liver per patient - no hx of Hep C testing - Hepatitis C antibody   Halina Maidens, MD [facility]  12/26/2015

## 2015-12-26 NOTE — Patient Instructions (Signed)
Breast Self-Awareness Practicing breast self-awareness may pick up problems early, prevent significant medical complications, and possibly save your life. By practicing breast self-awareness, you can become familiar with how your breasts look and feel and if your breasts are changing. This allows you to notice changes early. It can also offer you some reassurance that your breast health is good. One way to learn what is normal for your breasts and whether your breasts are changing is to do a breast self-exam. If you find a lump or something that was not present in the past, it is best to contact your caregiver right away. Other findings that should be evaluated by your caregiver include nipple discharge, especially if it is bloody; skin changes or reddening; areas where the skin seems to be pulled in (retracted); or new lumps and bumps. Breast pain is seldom associated with cancer (malignancy), but should also be evaluated by a caregiver. HOW TO PERFORM A BREAST SELF-EXAM The best time to examine your breasts is 5-7 days after your menstrual period is over. During menstruation, the breasts are lumpier, and it may be more difficult to pick up changes. If you do not menstruate, have reached menopause, or had your uterus removed (hysterectomy), you should examine your breasts at regular intervals, such as monthly. If you are breastfeeding, examine your breasts after a feeding or after using a breast pump. Breast implants do not decrease the risk for lumps or tumors, so continue to perform breast self-exams as recommended. Talk to your caregiver about how to determine the difference between the implant and breast tissue. Also, talk about the amount of pressure you should use during the exam. Over time, you will become more familiar with the variations of your breasts and more comfortable with the exam. A breast self-exam requires you to remove all your clothes above the waist. 1. Look at your breasts and nipples.  Stand in front of a mirror in a room with good lighting. With your hands on your hips, push your hands firmly downward. Look for a difference in shape, contour, and size from one breast to the other (asymmetry). Asymmetry includes puckers, dips, or bumps. Also, look for skin changes, such as reddened or scaly areas on the breasts. Look for nipple changes, such as discharge, dimpling, repositioning, or redness. 2. Carefully feel your breasts. This is best done either in the shower or tub while using soapy water or when flat on your back. Place the arm (on the side of the breast you are examining) above your head. Use the pads (not the fingertips) of your three middle fingers on your opposite hand to feel your breasts. Start in the underarm area and use  inch (2 cm) overlapping circles to feel your breast. Use 3 different levels of pressure (light, medium, and firm pressure) at each circle before moving to the next circle. The light pressure is needed to feel the tissue closest to the skin. The medium pressure will help to feel breast tissue a little deeper, while the firm pressure is needed to feel the tissue close to the ribs. Continue the overlapping circles, moving downward over the breast until you feel your ribs below your breast. Then, move one finger-width towards the center of the body. Continue to use the  inch (2 cm) overlapping circles to feel your breast as you move slowly up toward the collar bone (clavicle) near the base of the neck. Continue the up and down exam using all 3 pressures until you reach the   middle of the chest. Do this with each breast, carefully feeling for lumps or changes. 3.  Keep a written record with breast changes or normal findings for each breast. By writing this information down, you do not need to depend only on memory for size, tenderness, or location. Write down where you are in your menstrual cycle, if you are still menstruating. Breast tissue can have some lumps or  thick tissue. However, see your caregiver if you find anything that concerns you.  SEEK MEDICAL CARE IF:  You see a change in shape, contour, or size of your breasts or nipples.   You see skin changes, such as reddened or scaly areas on the breasts or nipples.   You have an unusual discharge from your nipples.   You feel a new lump or unusually thick areas.    This information is not intended to replace advice given to you by your health care provider. Make sure you discuss any questions you have with your health care provider.   Document Released: 08/04/2005 Document Revised: 07/21/2012 Document Reviewed: 11/19/2011 Elsevier Interactive Patient Education 2016 [employer] Inc.  

## 2015-12-27 LAB — HEMOGLOBIN A1C
ESTIMATED AVERAGE GLUCOSE: 148 mg/dL
HEMOGLOBIN A1C: 6.8 % — AB (ref 4.8–5.6)

## 2015-12-27 LAB — HEPATITIS C ANTIBODY: HEP C VIRUS AB: 0.1 {s_co_ratio} (ref 0.0–0.9)

## 2016-01-04 ENCOUNTER — Ambulatory Visit: Admitting: Surgery

## 2016-01-04 ENCOUNTER — Encounter: Payer: Self-pay | Admitting: Surgery

## 2016-01-04 ENCOUNTER — Ambulatory Visit (INDEPENDENT_AMBULATORY_CARE_PROVIDER_SITE_OTHER): Admitting: Surgery

## 2016-01-04 VITALS — BP 132/76 | HR 71 | Temp 98.1°F | Ht 64.0 in | Wt 157.2 lb

## 2016-01-04 DIAGNOSIS — R2232 Localized swelling, mass and lump, left upper limb: Secondary | ICD-10-CM | POA: Diagnosis not present

## 2016-01-04 DIAGNOSIS — R2242 Localized swelling, mass and lump, left lower limb: Secondary | ICD-10-CM | POA: Diagnosis not present

## 2016-01-04 NOTE — Patient Instructions (Signed)
We will bring you back in and take both of these areas out in the office as scheduled below.  Call with any questions or concerns prior to your next scheduled appointment.

## 2016-01-04 NOTE — Progress Notes (Signed)
Patient ID: Jenna Stein, female   DOB: 1951-04-02, 65 y.o.   MRN: [id number]  History of Present Illness Daily Provins is a 65 y.o. female seen in consultation for a left upper arm mass and a left lateral foot nodule. She reports that the left upper mass has been present for the last 15 years after a flu shot. Over the last couple of years she has increase in size, it is tender when she lays on that side. She experienced some intermittent mild dull pain that is only located on the left arm. No family history of cancer. Never had any excision that side. And the next lesion is in the left lateral foot and is been present for last few months and and is tender to palpation and when she puts any pressure on that aspect. She is otherwise in good health and is able to perform more than 6 Mets of activity without shortness of breath or chest pain  Past Medical History Past Medical History  Diagnosis Date  . Hyperlipidemia   . Hypertension   . GERD (gastroesophageal reflux disease)   . Diabetes mellitus without complication ([place])   . Allergy   . Anxiety     Past Surgical History  Procedure Laterality Date  . Hand surgery  07/2012    Allergies  Allergen Reactions  . Clonazepam     Nocturnal enuresis; Lethargy  . Meloxicam   . Trazodone Other (See Comments)    Dizziness    Current Outpatient Prescriptions  Medication Sig Dispense Refill  . acyclovir (ZOVIRAX) 200 MG capsule Take 1 capsule (200 mg total) by mouth 5 (five) times daily. 90 capsule 3  . atenolol (TENORMIN) 50 MG tablet Take 1 tablet (50 mg total) by mouth daily. 90 tablet 3  . atorvastatin (LIPITOR) 10 MG tablet Take 1 tablet (10 mg total) by mouth at bedtime. 90 tablet 3  . Cholecalciferol (VITAMIN D HIGH POTENCY) 1000 UNITS capsule Take 1,000 Units by mouth daily.     . Coenzyme Q10 (CO Q 10) 100 MG CAPS Take 1 capsule by mouth daily.     . cyclobenzaprine (FLEXERIL) 5 MG tablet Take 1 tablet (5 mg total) by mouth 3 (three)  times daily as needed for muscle spasms. 270 tablet 3  . hydrochlorothiazide (HYDRODIURIL) 12.5 MG tablet Take 1 tablet (12.5 mg total) by mouth daily. 90 tablet 3  . Lecithin 1200 MG CAPS Take 1 capsule by mouth daily.     [name] loratadine (CLARITIN) 10 MG tablet Take 10 mg by mouth daily as needed.     . Lutein 20 MG TABS Take 1 tablet by mouth daily.     . metFORMIN (GLUCOPHAGE) 1000 MG tablet Take 1 tablet (1,000 mg total) by mouth 2 (two) times daily with a meal. 180 tablet 3  . MULTIPLE VITAMIN PO 1 tablet daily.     [name] olopatadine (PATANOL) 0.1 % ophthalmic solution Apply to eye.    [name] Omeprazole 20 MG TBEC Take 1 tablet (20 mg total) by mouth daily. 90 each 3  . PARoxetine (PAXIL) 20 MG tablet Take 1 tablet (20 mg total) by mouth daily. 90 tablet 3  . sitaGLIPtin (JANUVIA) 100 MG tablet Take 1 tablet (100 mg total) by mouth daily. 90 tablet 3  . valsartan (DIOVAN) 80 MG tablet Take 1 tablet (80 mg total) by mouth daily. 90 tablet 3  . vitamin C (ASCORBIC ACID) 500 MG tablet Take 500 mg by mouth daily.    [name]  vitamin E (VITAMIN E) 400 UNIT capsule Take 1 capsule (400 Units total) by mouth daily. 1 capsule 0   No current facility-administered medications for this visit.    Family History Family History  Problem Relation Age of Onset  . Hypertension Mother   . Hyperlipidemia Mother   . Arthritis Mother   . Parkinsonism Father   . Arthritis Other   . Hypertension Other   . Hyperlipidemia Other   . Hypothyroidism Other   . Breast cancer Sister 45       Social History Social History  Substance Use Topics  . Smoking status: Never Smoker   . Smokeless tobacco: Never Used  . Alcohol Use: No       ROS 10 pt review of system was performance otherwise negative  Physical Exam Blood pressure 132/76, pulse 71, temperature 98.1 F (36.7 C), temperature source Oral, height 5\' 4"  (1.626 m), weight 71.305 kg (157 lb 3.2 oz).  CONSTITUTIONAL: NAD EYES: Pupils equal, round, and reactive  to light, Sclera non-icteric. EARS, NOSE, MOUTH AND THROAT: The oropharynx is clear. Oral mucosa is pink and moist. Hearing is intact to voice.  NECK: Trachea is midline, and there is no jugular venous distension. Thyroid is without palpable abnormalities. LYMPH NODES:  Lymph nodes in the neck are not enlarged. GI: The abdomen is soft, nontender, and nondistended. There were no palpable masses. There was no hepatosplenomegaly. There were normal bowel sounds. GU: deferred MUSCULOSKELETAL:  Normal muscle strength and tone in all four extremities.    SKIN: Skin turgor is normal. 4 cm soft tissue lesion on left upper arm, mobile, non tedner. There is a 1 cm lesion on left lateral aspect of foot, ttp. No fluctuance NEUROLOGIC:  Motor and sensation is grossly normal.  Cranial nerves are grossly intact. PSYCH:  Alert and oriented to person, place and time. Affect is normal.  Data Reviewed  I have personally reviewed the patient's imaging and medical records.    Assessment/ Plan Soft tissue mass left arm symptomatic in need for excision. D/w the pt in detail about procedure. Amenable for excision under local. Left foot lesion in need for excision. We will perform both on outpt basis. D/w the pt in detail risk, benefits and possible complications including but not limited to: Bleeding, infection, re-intervention and chance of being malignant. She understands and wishes to proceed  Caroleen Hamman, MD [facility] 01/04/2016, 11:09 AM

## 2016-01-10 ENCOUNTER — Ambulatory Visit: Admitting: Surgery

## 2016-01-17 ENCOUNTER — Ambulatory Visit: Admitting: Family Medicine

## 2016-01-17 ENCOUNTER — Encounter: Payer: Self-pay | Admitting: Surgery

## 2016-01-17 ENCOUNTER — Ambulatory Visit (INDEPENDENT_AMBULATORY_CARE_PROVIDER_SITE_OTHER): Admitting: Surgery

## 2016-01-17 VITALS — BP 117/66 | HR 71 | Temp 98.0°F | Wt 158.0 lb

## 2016-01-17 DIAGNOSIS — R2232 Localized swelling, mass and lump, left upper limb: Secondary | ICD-10-CM | POA: Diagnosis not present

## 2016-01-17 DIAGNOSIS — R2242 Localized swelling, mass and lump, left lower limb: Secondary | ICD-10-CM | POA: Diagnosis not present

## 2016-01-17 NOTE — Patient Instructions (Signed)
Please do not shower until Saturday. Please keep it dry.  Do not remove the steri-strips, they will fall out on its own.  If you experience any pain, you are able to take Tylenol and Ibuprofen.

## 2016-01-17 NOTE — Progress Notes (Signed)
Procedure Note  Procedures: 1.  Excision of a 5 cm soft tissue mass in the left upper arm 2. Intermediate closure of 6 cm defect left upper arm 3. Excision of 22 mm lesion left foot 4. Intermediate closure of 27 mm defect left foot  Findings: Left arm lipoma and left foot soft tissue benign nodule  Anesthesia: lidocaine 1% w epi  EBL: 3 cc  Complications: none  After informed consent was obtained the areas of interest were prepped and draped in the usual sterile fashion. A transverse incision was created in the left upper arm using a 15 blade knife and we dissected through subcutaneous tissues tissue using Metzenbaum scissors. The mass was delivered and the mass was dissected from adjacent structures using Metzenbaum scissors. Specimen was removed and sent for permanent pathology. The wound was closed in a 2 layer fashion with interrupted dermal 30 Vicryls and 4-0 Monocryl's. Attention then was turned to the left foot and an elliptical incision was created to incorporate the left foot nodule. The nodule was excised from subcutaneous tissue using Metzenbaum scissors and hemostasis was obtained with pressure. The wound was closed in a 2 layer fashion with 3-0 Vicryl and 4-0 Monocryl. The skin was covered with Steri-Strips. There were no immediate complications

## 2016-01-22 ENCOUNTER — Telehealth: Payer: Self-pay

## 2016-01-22 ENCOUNTER — Encounter: Payer: Self-pay | Admitting: Surgery

## 2016-01-22 NOTE — Telephone Encounter (Signed)
Called patient to let her know that her pathology results showed that they were benign. I asked patient how she was doing and she stated that she was doing well. I told her that if she needed anything else from [place] to give [place] a call. Patient was reminded to come and see Dr. Dahlia Byes on 02/06/2016 if she chose to. Patient agreed.

## 2016-01-23 ENCOUNTER — Encounter: Payer: Self-pay | Admitting: Surgery

## 2016-02-06 ENCOUNTER — Ambulatory Visit: Payer: Self-pay | Admitting: Surgery

## 2016-02-06 ENCOUNTER — Encounter: Payer: Self-pay | Admitting: Surgery

## 2016-02-06 ENCOUNTER — Ambulatory Visit (INDEPENDENT_AMBULATORY_CARE_PROVIDER_SITE_OTHER): Admitting: Surgery

## 2016-02-06 VITALS — BP 112/69 | HR 73 | Temp 98.3°F | Wt 158.0 lb

## 2016-02-06 DIAGNOSIS — Z09 Encounter for follow-up examination after completed treatment for conditions other than malignant neoplasm: Secondary | ICD-10-CM

## 2016-02-06 MED ORDER — GABAPENTIN 400 MG PO CAPS: 400.0000 mg | ORAL_CAPSULE | Freq: Every day | ORAL | Status: DC

## 2016-02-06 NOTE — Patient Instructions (Signed)
Please try to take Gabapentin 400 MG once at night to help you with the pain on your foot.

## 2016-02-06 NOTE — Progress Notes (Signed)
S/p excision of leyomyoma left foot and lipoma of left arm Doing well some discomfort on foot Path d/w pt in detail  PE NAD Incisions healing well, no infection  A/P add gabapentin short course some neuropathic pain No surgical issues RTC prn

## 2016-02-07 ENCOUNTER — Ambulatory Visit: Payer: Self-pay | Admitting: Surgery

## 2016-02-18 LAB — HM DIABETES EYE EXAM

## 2016-04-02 ENCOUNTER — Telehealth: Payer: Self-pay

## 2016-04-02 MED ORDER — SULFAMETHOXAZOLE-TRIMETHOPRIM 800-160 MG PO TABS: 1.0000 | ORAL_TABLET | Freq: Two times a day (BID) | ORAL | 0 refills | Status: DC

## 2016-04-02 NOTE — Telephone Encounter (Signed)
Patient called in and states that area on left side of foot is red. Denies fever/chills, nausea/vomiting. Slight pain right at sight.  Patient has history of Excision of Left Lateral Foot Lesion on 01/17/16 in office with Dr. Dahlia Byes.  Received picture of this redness. Addressed with Dr. Burt Knack. He has ordered Bactrim for patient to start today and would like Dr. Dahlia Byes to see patient next day in clinic.  Call made to patient at this time. Explained all information above. Patient verbalizes understanding. Medication sent to pharmacy and patient placed on schedule to see Dr. Dahlia Byes

## 2016-04-16 ENCOUNTER — Ambulatory Visit: Payer: Self-pay | Admitting: Surgery

## 2016-04-17 ENCOUNTER — Encounter: Payer: Self-pay | Admitting: Surgery

## 2016-04-17 ENCOUNTER — Ambulatory Visit (INDEPENDENT_AMBULATORY_CARE_PROVIDER_SITE_OTHER): Admitting: Surgery

## 2016-04-17 VITALS — BP 160/80 | HR 79 | Temp 98.1°F | Wt 158.0 lb

## 2016-04-17 DIAGNOSIS — Z09 Encounter for follow-up examination after completed treatment for conditions other than malignant neoplasm: Secondary | ICD-10-CM

## 2016-04-17 NOTE — Progress Notes (Signed)
S/p excision of foot soft tissue mass c/w leiomyoma 01/2016 Developed some redness and cellulitis that responded to bactrim She also reports some nerve pain on dorsum of the foot and lateral aspect of her left leg  PE NAD Foot w some induration , no abscess, no necrotizing infection  A/p some residual scar tissue, might be residual leiomyoma, d/w the pt in detail, she wishes to wait until October. We will see her again and determine if she would need re-excision. No need for surgical indication at this time. Regarding the nerve pain advice to place ice packs, she does not wish to try gabapentin at this time

## 2016-04-17 NOTE — Patient Instructions (Signed)
Please give [place] a call if you have any questions or concerns.   Have a safe and fun trip at [place]!!!

## 2016-04-29 ENCOUNTER — Other Ambulatory Visit: Payer: Self-pay | Admitting: Internal Medicine

## 2016-04-29 DIAGNOSIS — E119 Type 2 diabetes mellitus without complications: Secondary | ICD-10-CM

## 2016-04-29 DIAGNOSIS — I10 Essential (primary) hypertension: Secondary | ICD-10-CM

## 2016-04-29 DIAGNOSIS — F419 Anxiety disorder, unspecified: Secondary | ICD-10-CM

## 2016-04-29 DIAGNOSIS — K219 Gastro-esophageal reflux disease without esophagitis: Secondary | ICD-10-CM

## 2016-04-29 MED ORDER — VALSARTAN 80 MG PO TABS: 80.0000 mg | ORAL_TABLET | Freq: Every day | ORAL | 0 refills | Status: DC

## 2016-04-29 MED ORDER — ATENOLOL 50 MG PO TABS: 50.0000 mg | ORAL_TABLET | Freq: Every day | ORAL | 0 refills | Status: DC

## 2016-04-29 MED ORDER — SITAGLIPTIN PHOSPHATE 100 MG PO TABS: 100.0000 mg | ORAL_TABLET | Freq: Every day | ORAL | 0 refills | Status: DC

## 2016-04-29 MED ORDER — OMEPRAZOLE 20 MG PO TBEC: 20.0000 mg | DELAYED_RELEASE_TABLET | Freq: Every day | ORAL | 0 refills | Status: DC

## 2016-04-29 MED ORDER — PAROXETINE HCL 20 MG PO TABS: 20.0000 mg | ORAL_TABLET | Freq: Every day | ORAL | 0 refills | Status: DC

## 2016-04-29 MED ORDER — HYDROCHLOROTHIAZIDE 12.5 MG PO TABS: 12.5000 mg | ORAL_TABLET | Freq: Every day | ORAL | 0 refills | Status: DC

## 2016-06-02 ENCOUNTER — Telehealth: Payer: Self-pay

## 2016-06-02 ENCOUNTER — Other Ambulatory Visit: Payer: Self-pay | Admitting: Internal Medicine

## 2016-06-02 NOTE — Telephone Encounter (Signed)
Lab orders have already been placed. She needs to come have them released and then she can go to [facility].

## 2016-06-02 NOTE — Telephone Encounter (Signed)
Patient is in [place] and wants to get lab order on Oct 18 when she returns before her 10/24 CPE. She will call back if no VM left or no text sent.

## 2016-06-05 ENCOUNTER — Other Ambulatory Visit: Payer: Self-pay

## 2016-06-05 ENCOUNTER — Ambulatory Visit: Payer: Self-pay | Admitting: Surgery

## 2016-06-05 DIAGNOSIS — E785 Hyperlipidemia, unspecified: Secondary | ICD-10-CM

## 2016-06-05 DIAGNOSIS — E119 Type 2 diabetes mellitus without complications: Secondary | ICD-10-CM

## 2016-06-05 DIAGNOSIS — I10 Essential (primary) hypertension: Secondary | ICD-10-CM

## 2016-06-06 LAB — CBC WITH DIFFERENTIAL/PLATELET
BASOS: 1 %
Basophils Absolute: 0.1 10*3/uL (ref 0.0–0.2)
EOS (ABSOLUTE): 0.3 10*3/uL (ref 0.0–0.4)
EOS: 4 %
HEMATOCRIT: 37.6 % (ref 34.0–46.6)
HEMOGLOBIN: 13.1 g/dL (ref 11.1–15.9)
IMMATURE GRANULOCYTES: 0 %
Immature Grans (Abs): 0 10*3/uL (ref 0.0–0.1)
LYMPHS ABS: 2.4 10*3/uL (ref 0.7–3.1)
Lymphs: 38 %
MCH: 31 pg (ref 26.6–33.0)
MCHC: 34.8 g/dL (ref 31.5–35.7)
MCV: 89 fL (ref 79–97)
MONOCYTES: 7 %
Monocytes Absolute: 0.4 10*3/uL (ref 0.1–0.9)
Neutrophils Absolute: 3.2 10*3/uL (ref 1.4–7.0)
Neutrophils: 50 %
Platelets: 242 10*3/uL (ref 150–379)
RBC: 4.23 x10E6/uL (ref 3.77–5.28)
RDW: 14.1 % (ref 12.3–15.4)
WBC: 6.4 10*3/uL (ref 3.4–10.8)

## 2016-06-06 LAB — COMPREHENSIVE METABOLIC PANEL
A/G RATIO: 1.5 (ref 1.2–2.2)
ALT: 86 IU/L — ABNORMAL HIGH (ref 0–32)
AST: 62 IU/L — ABNORMAL HIGH (ref 0–40)
Albumin: 4.4 g/dL (ref 3.6–4.8)
Alkaline Phosphatase: 64 IU/L (ref 39–117)
BUN / CREAT RATIO: 10 — AB (ref 12–28)
BUN: 9 mg/dL (ref 8–27)
Bilirubin Total: 0.9 mg/dL (ref 0.0–1.2)
CALCIUM: 9.7 mg/dL (ref 8.7–10.3)
CO2: 29 mmol/L (ref 18–29)
CREATININE: 0.88 mg/dL (ref 0.57–1.00)
Chloride: 98 mmol/L (ref 96–106)
GFR calc Af Amer: 80 mL/min/{1.73_m2} (ref >59)
GFR, EST NON AFRICAN AMERICAN: 70 mL/min/{1.73_m2} (ref >59)
GLOBULIN, TOTAL: 2.9 g/dL (ref 1.5–4.5)
Glucose: 130 mg/dL — ABNORMAL HIGH (ref 65–99)
POTASSIUM: 4.4 mmol/L (ref 3.5–5.2)
SODIUM: 141 mmol/L (ref 134–144)
TOTAL PROTEIN: 7.3 g/dL (ref 6.0–8.5)

## 2016-06-06 LAB — MICROALBUMIN / CREATININE URINE RATIO
CREATININE, UR: 109.9 mg/dL
Microalb/Creat Ratio: 14.9 mg/g creat (ref 0.0–30.0)
Microalbumin, Urine: 16.4 ug/mL

## 2016-06-06 LAB — HEMOGLOBIN A1C
ESTIMATED AVERAGE GLUCOSE: 143 mg/dL
Hgb A1c MFr Bld: 6.6 % — ABNORMAL HIGH (ref 4.8–5.6)

## 2016-06-06 LAB — LIPID PANEL
Chol/HDL Ratio: 3.6 ratio units (ref 0.0–4.4)
Cholesterol, Total: 175 mg/dL (ref 100–199)
HDL: 49 mg/dL (ref >39)
LDL CALC: 95 mg/dL (ref 0–99)
Triglycerides: 153 mg/dL — ABNORMAL HIGH (ref 0–149)
VLDL CHOLESTEROL CAL: 31 mg/dL (ref 5–40)

## 2016-06-06 LAB — TSH: TSH: 1.37 u[IU]/mL (ref 0.450–4.500)

## 2016-06-10 ENCOUNTER — Ambulatory Visit (INDEPENDENT_AMBULATORY_CARE_PROVIDER_SITE_OTHER): Admitting: Internal Medicine

## 2016-06-10 ENCOUNTER — Encounter: Payer: Self-pay | Admitting: Internal Medicine

## 2016-06-10 VITALS — BP 138/82 | HR 72 | Resp 16 | Ht 64.0 in | Wt 156.6 lb

## 2016-06-10 DIAGNOSIS — Z1231 Encounter for screening mammogram for malignant neoplasm of breast: Secondary | ICD-10-CM

## 2016-06-10 DIAGNOSIS — K219 Gastro-esophageal reflux disease without esophagitis: Secondary | ICD-10-CM

## 2016-06-10 DIAGNOSIS — E119 Type 2 diabetes mellitus without complications: Secondary | ICD-10-CM | POA: Diagnosis not present

## 2016-06-10 DIAGNOSIS — F419 Anxiety disorder, unspecified: Secondary | ICD-10-CM | POA: Diagnosis not present

## 2016-06-10 DIAGNOSIS — A6000 Herpesviral infection of urogenital system, unspecified: Secondary | ICD-10-CM

## 2016-06-10 DIAGNOSIS — Z01419 Encounter for gynecological examination (general) (routine) without abnormal findings: Secondary | ICD-10-CM

## 2016-06-10 DIAGNOSIS — M19041 Primary osteoarthritis, right hand: Secondary | ICD-10-CM | POA: Diagnosis not present

## 2016-06-10 DIAGNOSIS — Z1239 Encounter for other screening for malignant neoplasm of breast: Secondary | ICD-10-CM

## 2016-06-10 DIAGNOSIS — M19042 Primary osteoarthritis, left hand: Secondary | ICD-10-CM | POA: Diagnosis not present

## 2016-06-10 DIAGNOSIS — I10 Essential (primary) hypertension: Secondary | ICD-10-CM

## 2016-06-10 DIAGNOSIS — E782 Mixed hyperlipidemia: Secondary | ICD-10-CM

## 2016-06-10 MED ORDER — METFORMIN HCL 1000 MG PO TABS: 1000.0000 mg | ORAL_TABLET | Freq: Two times a day (BID) | ORAL | 3 refills | Status: DC

## 2016-06-10 MED ORDER — ATENOLOL 50 MG PO TABS: 50.0000 mg | ORAL_TABLET | Freq: Every day | ORAL | 3 refills | Status: DC

## 2016-06-10 MED ORDER — PAROXETINE HCL 20 MG PO TABS: 20.0000 mg | ORAL_TABLET | Freq: Every day | ORAL | 3 refills | Status: DC

## 2016-06-10 MED ORDER — ACYCLOVIR 200 MG PO CAPS: 200.0000 mg | ORAL_CAPSULE | Freq: Every day | ORAL | 3 refills | Status: DC

## 2016-06-10 MED ORDER — SITAGLIPTIN PHOSPHATE 100 MG PO TABS: 100.0000 mg | ORAL_TABLET | Freq: Every day | ORAL | 3 refills | Status: DC

## 2016-06-10 MED ORDER — CYCLOBENZAPRINE HCL 5 MG PO TABS: 5.0000 mg | ORAL_TABLET | Freq: Three times a day (TID) | ORAL | 0 refills | Status: DC | PRN

## 2016-06-10 MED ORDER — HYDROCHLOROTHIAZIDE 12.5 MG PO TABS: 12.5000 mg | ORAL_TABLET | Freq: Every day | ORAL | 3 refills | Status: DC

## 2016-06-10 MED ORDER — VALSARTAN 80 MG PO TABS: 80.0000 mg | ORAL_TABLET | Freq: Every day | ORAL | 3 refills | Status: DC

## 2016-06-10 MED ORDER — ATORVASTATIN CALCIUM 10 MG PO TABS: 10.0000 mg | ORAL_TABLET | Freq: Every day | ORAL | 3 refills | Status: DC

## 2016-06-10 MED ORDER — OMEPRAZOLE 20 MG PO TBEC: 20.0000 mg | DELAYED_RELEASE_TABLET | Freq: Every day | ORAL | 3 refills | Status: DC

## 2016-06-10 NOTE — Progress Notes (Signed)
Date:  06/10/2016   Name:  Jenna Stein   DOB:  02-Aug-1951   MRN:  [id number]   Chief Complaint: Annual Exam (PAP 2018 not today. Did Urine at Lab per patient ) and Hand Pain (Trigger Finger referral ) Jenna Stein is a 65 y.o. female who presents today for her Complete Annual Exam. She feels fairly well. She reports exercising none currently but plans to resume. She reports she is sleeping well.   Hand Pain   There was no injury mechanism. The pain is present in the right hand. The quality of the pain is described as cramping and aching. Pertinent negatives include no chest pain.  Hypertension  This is a chronic problem. The current episode started more than 1 year ago. The problem is unchanged. The problem is controlled. Pertinent negatives include no chest pain, headaches, palpitations or shortness of breath. Past treatments include angiotensin blockers.  Diabetes  She presents for her follow-up diabetic visit. She has type 2 diabetes mellitus. Pertinent negatives for hypoglycemia include no dizziness, headaches, nervousness/anxiousness or tremors. Pertinent negatives for diabetes include no chest pain, no fatigue, no polydipsia and no polyuria. Symptoms are stable. Current diabetic treatment includes oral agent (dual therapy). She is compliant with treatment most of the time. Her weight is stable.  Hyperlipidemia  This is a chronic problem. The problem is controlled. Recent lipid tests were reviewed and are normal. Pertinent negatives include no chest pain or shortness of breath. Current antihyperlipidemic treatment includes statins. The current treatment provides significant improvement of lipids.  Anxiety - doing well on Paxil.  Sleep and weight are stable.    Review of Systems  Constitutional: Negative for chills, fatigue and fever.  HENT: Negative for congestion, hearing loss, tinnitus, trouble swallowing and voice change.   Eyes: Negative for visual disturbance.  Respiratory:  Negative for cough, chest tightness, shortness of breath and wheezing.   Cardiovascular: Negative for chest pain, palpitations and leg swelling.  Gastrointestinal: Negative for abdominal pain, constipation, diarrhea and vomiting.  Endocrine: Negative for polydipsia and polyuria.  Genitourinary: Negative for dysuria, frequency, genital sores, vaginal bleeding and vaginal discharge.  Musculoskeletal: Positive for arthralgias (right hand and fingers). Negative for gait problem and joint swelling.  Skin: Negative for color change and rash.  Neurological: Negative for dizziness, tremors, light-headedness and headaches.  Hematological: Negative for adenopathy. Does not bruise/bleed easily.  Psychiatric/Behavioral: Negative for dysphoric mood and sleep disturbance. The patient is not nervous/anxious.     Patient Active Problem List   Diagnosis Date Noted  . Abnormal ECG 04/30/2015  . Allergic rhinitis, seasonal 04/30/2015  . Anxiety 04/30/2015  . Arthritis 04/30/2015  . Controlled diabetes mellitus ([place]) 04/30/2015  . Hepatic steatosis 04/30/2015  . Acid reflux 04/30/2015  . Osteoarthritis of both hands 04/30/2015  . Blood in the urine 04/30/2015  . Genital herpes 04/04/2009  . Bone/cartilage disorder 08/04/2003  . Clinical depression 08/18/1998  . Hypertension 08/18/1998  . Hyperlipidemia 08/18/1998  . Insomnia 08/18/1998    Prior to Admission medications   Medication Sig Start Date End Date Taking? Authorizing Provider  acyclovir (ZOVIRAX) 200 MG capsule Take 1 capsule (200 mg total) by mouth 5 (five) times daily. 05/09/15  Yes Margarita Rana, MD  Alpha-Lipoic Acid (NEOKE RA LIPOIC PO) Take 100 mg by mouth.   Yes Historical Provider, MD  atenolol (TENORMIN) 50 MG tablet Take 1 tablet (50 mg total) by mouth daily. 04/29/16  Yes Glean Hess, MD  atorvastatin (LIPITOR)  10 MG tablet Take 1 tablet (10 mg total) by mouth at bedtime. 05/09/15  Yes Margarita Rana, MD  Cholecalciferol (VITAMIN  D HIGH POTENCY) 1000 UNITS capsule Take 1,000 Units by mouth daily.    Yes Historical Provider, MD  Chromium (CHROMEMATE PO) Take 500 mg by mouth.   Yes Historical Provider, MD  Coenzyme Q10 (CO Q 10) 100 MG CAPS Take 1 capsule by mouth daily.    Yes Historical Provider, MD  cyclobenzaprine (FLEXERIL) 5 MG tablet Take 1 tablet (5 mg total) by mouth 3 (three) times daily as needed for muscle spasms. 05/09/15  Yes Margarita Rana, MD  gabapentin (NEURONTIN) 400 MG capsule Take 1 capsule (400 mg total) by mouth at bedtime. 02/06/16  Yes Diego Sarita Haver, MD  Gymnema Sylvestris Leaf POWD by Does not apply route.   Yes Historical Provider, MD  hydrochlorothiazide (HYDRODIURIL) 12.5 MG tablet Take 1 tablet (12.5 mg total) by mouth daily. 04/29/16  Yes Glean Hess, MD  Lecithin 1200 MG CAPS Take 1 capsule by mouth daily.    Yes Historical Provider, MD  loratadine (CLARITIN) 10 MG tablet Take 10 mg by mouth daily as needed.  11/21/13  Yes Historical Provider, MD  Lutein 20 MG TABS Take 1 tablet by mouth daily.    Yes Historical Provider, MD  LUTEIN-ZEAXANTHIN PO Take 4 mg by mouth.   Yes Historical Provider, MD  metFORMIN (GLUCOPHAGE) 1000 MG tablet Take 1 tablet (1,000 mg total) by mouth 2 (two) times daily with a meal. 09/05/15  Yes Margarita Rana, MD  MULTIPLE VITAMIN PO 1 tablet daily.  04/04/09  Yes Historical Provider, MD  olopatadine (PATANOL) 0.1 % ophthalmic solution Apply to eye.   Yes Historical Provider, MD  Omega-3 Fatty Acids (FISH OIL PEARLS PO) Take by mouth.   Yes Historical Provider, MD  Omeprazole 20 MG TBEC Take 1 tablet (20 mg total) by mouth daily. 04/29/16  Yes Glean Hess, MD  PARoxetine (PAXIL) 20 MG tablet Take 1 tablet (20 mg total) by mouth daily. 04/29/16  Yes Glean Hess, MD  sitaGLIPtin (JANUVIA) 100 MG tablet Take 1 tablet (100 mg total) by mouth daily. 04/29/16  Yes Glean Hess, MD  valsartan (DIOVAN) 80 MG tablet Take 1 tablet (80 mg total) by mouth daily. 04/29/16  Yes  Glean Hess, MD  vitamin C (ASCORBIC ACID) 500 MG tablet Take 500 mg by mouth daily.   Yes Historical Provider, MD  vitamin E (VITAMIN E) 400 UNIT capsule Take 1 capsule (400 Units total) by mouth daily. 09/05/15  Yes Margarita Rana, MD    Allergies  Allergen Reactions  . Clonazepam     Nocturnal enuresis; Lethargy  . Meloxicam   . Trazodone Other (See Comments)    Dizziness    Past Surgical History:  Procedure Laterality Date  . CYSTECTOMY Left    foot  . HAND SURGERY  07/2012    Social History  Substance Use Topics  . Smoking status: Never Smoker  . Smokeless tobacco: Never Used  . Alcohol use No     Medication list has been reviewed and updated.   Physical Exam  Constitutional: She is oriented to person, place, and time. She appears well-developed and well-nourished. No distress.  HENT:  Head: Normocephalic and atraumatic.  Right Ear: Tympanic membrane and ear canal normal.  Left Ear: Tympanic membrane and ear canal normal.  Nose: Right sinus exhibits no maxillary sinus tenderness. Left sinus exhibits no maxillary sinus tenderness.  Mouth/Throat: Uvula is midline and oropharynx is clear and moist.  Eyes: Conjunctivae and EOM are normal. Right eye exhibits no discharge. Left eye exhibits no discharge. No scleral icterus.  Neck: Normal range of motion. Carotid bruit is not present. No erythema present. No thyromegaly present.  Cardiovascular: Normal rate, regular rhythm, normal heart sounds and normal pulses.   Pulmonary/Chest: Effort normal. No respiratory distress. She has no wheezes. Right breast exhibits no mass, no nipple discharge, no skin change and no tenderness. Left breast exhibits no mass, no nipple discharge, no skin change and no tenderness.  Abdominal: Soft. Bowel sounds are normal. There is no hepatosplenomegaly. There is no tenderness. There is no CVA tenderness.  Musculoskeletal: Normal range of motion. She exhibits no edema or tenderness.    Lymphadenopathy:    She has no cervical adenopathy.    She has no axillary adenopathy.  Neurological: She is alert and oriented to person, place, and time. She has normal reflexes. No cranial nerve deficit or sensory deficit.  Skin: Skin is warm, dry and intact. No rash noted.  Psychiatric: She has a normal mood and affect. Her speech is normal and behavior is normal. Thought content normal.  Nursing note and vitals reviewed.   BP 138/82   Pulse 72   Resp 16   Ht 5\' 4"  (1.626 m)   Wt 156 lb 9.6 oz (71 kg)   SpO2 99%   BMI 26.88 kg/m   Assessment and Plan: 1. Encounter for well woman exam Recommend monthly breast exams  2. Breast cancer screening Schedule mammogram - MM DIGITAL SCREENING BILATERAL; Future  3. Essential hypertension controlled - atenolol (TENORMIN) 50 MG tablet; Take 1 tablet (50 mg total) by mouth daily.  Dispense: 90 tablet; Refill: 3 - valsartan (DIOVAN) 80 MG tablet; Take 1 tablet (80 mg total) by mouth daily.  Dispense: 90 tablet; Refill: 3 - hydrochlorothiazide (HYDRODIURIL) 12.5 MG tablet; Take 1 tablet (12.5 mg total) by mouth daily.  Dispense: 90 tablet; Refill: 3  4. Primary osteoarthritis of both hands With trigger finger index finger - AMB referral to orthopedics  5. Mixed hyperlipidemia On statin therapy - atorvastatin (LIPITOR) 10 MG tablet; Take 1 tablet (10 mg total) by mouth at bedtime.  Dispense: 90 tablet; Refill: 3  6. Genital herpes simplex, unspecified site - acyclovir (ZOVIRAX) 200 MG capsule; Take 1 capsule (200 mg total) by mouth 5 (five) times daily.  Dispense: 90 capsule; Refill: 3  7. Gastroesophageal reflux disease, esophagitis presence not specified controlled - Omeprazole 20 MG TBEC; Take 1 tablet (20 mg total) by mouth daily.  Dispense: 90 each; Refill: 3  8. Type 2 diabetes mellitus without complication, without long-term current use of insulin (HCC) - sitaGLIPtin (JANUVIA) 100 MG tablet; Take 1 tablet (100 mg total) by  mouth daily.  Dispense: 90 tablet; Refill: 3 - metFORMIN (GLUCOPHAGE) 1000 MG tablet; Take 1 tablet (1,000 mg total) by mouth 2 (two) times daily with a meal.  Dispense: 180 tablet; Refill: 3  9. Anxiety stable - PARoxetine (PAXIL) 20 MG tablet; Take 1 tablet (20 mg total) by mouth daily.  Dispense: 90 tablet; Refill: Gleason, MD [facility]  06/10/2016

## 2016-06-21 ENCOUNTER — Other Ambulatory Visit: Payer: Self-pay | Admitting: Internal Medicine

## 2016-06-21 DIAGNOSIS — I10 Essential (primary) hypertension: Secondary | ICD-10-CM

## 2016-06-21 DIAGNOSIS — K219 Gastro-esophageal reflux disease without esophagitis: Secondary | ICD-10-CM

## 2016-06-21 DIAGNOSIS — F419 Anxiety disorder, unspecified: Secondary | ICD-10-CM

## 2016-07-15 DIAGNOSIS — M65311 Trigger thumb, right thumb: Secondary | ICD-10-CM | POA: Diagnosis not present

## 2016-07-15 DIAGNOSIS — M65321 Trigger finger, right index finger: Secondary | ICD-10-CM | POA: Diagnosis not present

## 2016-07-24 DIAGNOSIS — M65311 Trigger thumb, right thumb: Secondary | ICD-10-CM | POA: Diagnosis not present

## 2016-07-24 DIAGNOSIS — M65321 Trigger finger, right index finger: Secondary | ICD-10-CM | POA: Diagnosis not present

## 2016-08-26 ENCOUNTER — Ambulatory Visit
Admission: RE | Admit: 2016-08-26 | Discharge: 2016-08-26 | Disposition: A | Discharge status: Home / Self Care | Payer: Medicare Other | Source: Ambulatory Visit | Attending: Internal Medicine | Admitting: Internal Medicine

## 2016-08-26 DIAGNOSIS — Z1239 Encounter for other screening for malignant neoplasm of breast: Secondary | ICD-10-CM

## 2016-08-26 DIAGNOSIS — Z1231 Encounter for screening mammogram for malignant neoplasm of breast: Secondary | ICD-10-CM | POA: Insufficient documentation

## 2016-10-02 ENCOUNTER — Telehealth: Payer: Self-pay

## 2016-10-02 NOTE — Telephone Encounter (Signed)
Pt called requesting to get Labs ordered before her visit Next Friday on 10/10/2016. She said she normally does this with you before her visit, so she can go over results with you at her appointment.

## 2016-10-03 NOTE — Telephone Encounter (Signed)
That only applies to labs done for a CPX.  Next week will only be an A1C which we will call her about afterwards.

## 2016-10-10 ENCOUNTER — Encounter: Payer: Self-pay | Admitting: Internal Medicine

## 2016-10-10 ENCOUNTER — Ambulatory Visit (INDEPENDENT_AMBULATORY_CARE_PROVIDER_SITE_OTHER): Payer: Medicare Other | Admitting: Internal Medicine

## 2016-10-10 VITALS — BP 128/82 | HR 78 | Temp 98.0°F | Ht 64.0 in | Wt 154.0 lb

## 2016-10-10 DIAGNOSIS — R682 Dry mouth, unspecified: Secondary | ICD-10-CM | POA: Insufficient documentation

## 2016-10-10 DIAGNOSIS — Z23 Encounter for immunization: Secondary | ICD-10-CM

## 2016-10-10 DIAGNOSIS — F5101 Primary insomnia: Secondary | ICD-10-CM

## 2016-10-10 DIAGNOSIS — I10 Essential (primary) hypertension: Secondary | ICD-10-CM | POA: Diagnosis not present

## 2016-10-10 DIAGNOSIS — E119 Type 2 diabetes mellitus without complications: Secondary | ICD-10-CM

## 2016-10-10 DIAGNOSIS — E2839 Other primary ovarian failure: Secondary | ICD-10-CM | POA: Diagnosis not present

## 2016-10-10 MED ORDER — ZALEPLON 5 MG PO CAPS: 5.0000 mg | ORAL_CAPSULE | Freq: Every evening | ORAL | 1 refills | Status: DC | PRN

## 2016-10-10 NOTE — Progress Notes (Signed)
Date:  10/10/2016   Name:  Jenna Stein   DOB:  04/18/1951   MRN:  [id number]   Chief Complaint: Diabetes (wants pnemonia shot. having dry mouth (eversince taking metformin), and difficulty sleeping) Diabetes  She presents for her follow-up diabetic visit. She has type 2 diabetes mellitus. Her disease course has been stable. Pertinent negatives for hypoglycemia include no headaches or tremors. Pertinent negatives for diabetes include no chest pain, no fatigue, no foot paresthesias, no polydipsia, no polyuria, no visual change and no weight loss. Symptoms are stable. She is following a generally healthy diet.  Insomnia  Primary symptoms: difficulty falling asleep.  The current episode started more than one year. The problem occurs every several days. The problem is unchanged. Past treatments include medication.  Hypertension  This is a chronic problem. The current episode started more than 1 year ago. The problem is unchanged. The problem is controlled. Pertinent negatives include no chest pain, headaches, palpitations or shortness of breath. Past treatments include diuretics, angiotensin blockers and beta blockers. The current treatment provides significant improvement.  Dry mouth - for several years but getting worse.  No excessive caries, no ulcerations, tried Biotene but tasted terrible.  Mild dry eye but no redness.  Never tested for Sjogrens or seen by oral surgeon. Ovarian failure - due for bone density.  Taking vitamin D and exercising regularly.  Lab Results  Component Value Date   HGBA1C 6.6 (H) 06/05/2016     Review of Systems  Constitutional: Negative for appetite change, fatigue, fever, unexpected weight change and weight loss.  HENT: Negative for mouth sores (but dry), tinnitus and trouble swallowing.   Eyes: Negative for visual disturbance.  Respiratory: Negative for cough, chest tightness and shortness of breath.   Cardiovascular: Negative for chest pain, palpitations  and leg swelling.  Gastrointestinal: Negative for abdominal pain.  Endocrine: Negative for polydipsia and polyuria.  Genitourinary: Negative for dysuria and hematuria.  Musculoskeletal: Negative for arthralgias.  Neurological: Negative for tremors, numbness and headaches.  Psychiatric/Behavioral: Negative for dysphoric mood. The patient has insomnia.     Patient Active Problem List   Diagnosis Date Noted  . Abnormal ECG 04/30/2015  . Allergic rhinitis, seasonal 04/30/2015  . Anxiety 04/30/2015  . Arthritis 04/30/2015  . Controlled diabetes mellitus ([place]) 04/30/2015  . Hepatic steatosis 04/30/2015  . Gastroesophageal reflux disease 04/30/2015  . Osteoarthritis of both hands 04/30/2015  . Blood in the urine 04/30/2015  . Genital herpes 04/04/2009  . Bone/cartilage disorder 08/04/2003  . Clinical depression 08/18/1998  . Hypertension 08/18/1998  . Hyperlipidemia 08/18/1998  . Insomnia 08/18/1998    Prior to Admission medications   Medication Sig Start Date End Date Taking? Authorizing Provider  acyclovir (ZOVIRAX) 200 MG capsule Take 1 capsule (200 mg total) by mouth 5 (five) times daily. 06/10/16  Yes Glean Hess, MD  Alpha-Lipoic Acid (NEOKE RA LIPOIC PO) Take 100 mg by mouth.   Yes Historical Provider, MD  atenolol (TENORMIN) 50 MG tablet Take 1 tablet (50 mg total) by mouth daily. 06/10/16  Yes Glean Hess, MD  atorvastatin (LIPITOR) 10 MG tablet Take 1 tablet (10 mg total) by mouth at bedtime. 06/10/16  Yes Glean Hess, MD  Cholecalciferol (VITAMIN D HIGH POTENCY) 1000 UNITS capsule Take 1,000 Units by mouth daily.    Yes Historical Provider, MD  Chromium (CHROMEMATE PO) Take 500 mg by mouth.   Yes Historical Provider, MD  Coenzyme Q10 (CO Q 10) 100 MG CAPS  Take 1 capsule by mouth daily.    Yes Historical Provider, MD  cyclobenzaprine (FLEXERIL) 5 MG tablet Take 1 tablet (5 mg total) by mouth 3 (three) times daily as needed for muscle spasms. 06/10/16  Yes Glean Hess, MD  gabapentin (NEURONTIN) 400 MG capsule Take 1 capsule (400 mg total) by mouth at bedtime. 02/06/16  Yes Diego Sarita Haver, MD  Gymnema Sylvestris Leaf POWD by Does not apply route.   Yes Historical Provider, MD  hydrochlorothiazide (HYDRODIURIL) 12.5 MG tablet Take 1 tablet (12.5 mg total) by mouth daily. 06/10/16  Yes Glean Hess, MD  Lecithin 1200 MG CAPS Take 1 capsule by mouth daily.    Yes Historical Provider, MD  Lutein 20 MG TABS Take 1 tablet by mouth daily.    Yes Historical Provider, MD  LUTEIN-ZEAXANTHIN PO Take 4 mg by mouth.   Yes Historical Provider, MD  metFORMIN (GLUCOPHAGE) 1000 MG tablet Take 1 tablet (1,000 mg total) by mouth 2 (two) times daily with a meal. 06/10/16  Yes Glean Hess, MD  MULTIPLE VITAMIN PO 1 tablet daily.  04/04/09  Yes Historical Provider, MD  olopatadine (PATANOL) 0.1 % ophthalmic solution Apply to eye.   Yes Historical Provider, MD  Omega-3 Fatty Acids (FISH OIL PEARLS PO) Take by mouth.   Yes Historical Provider, MD  Omeprazole 20 MG TBEC Take 1 tablet (20 mg total) by mouth daily. 06/10/16  Yes Glean Hess, MD  PARoxetine (PAXIL) 20 MG tablet Take 1 tablet (20 mg total) by mouth daily. 06/10/16  Yes Glean Hess, MD  sitaGLIPtin (JANUVIA) 100 MG tablet Take 1 tablet (100 mg total) by mouth daily. 06/10/16  Yes Glean Hess, MD  valsartan (DIOVAN) 80 MG tablet Take 1 tablet (80 mg total) by mouth daily. 06/10/16  Yes Glean Hess, MD  vitamin C (ASCORBIC ACID) 500 MG tablet Take 500 mg by mouth daily.   Yes Historical Provider, MD  vitamin E (VITAMIN E) 400 UNIT capsule Take 1 capsule (400 Units total) by mouth daily. 09/05/15  Yes Margarita Rana, MD  loratadine (CLARITIN) 10 MG tablet Take 10 mg by mouth daily as needed.  11/21/13   Historical Provider, MD    Allergies  Allergen Reactions  . Clonazepam Other (See Comments)    Nocturnal enuresis; Lethargy Nocturnal enuresis; Lethargy  . Meloxicam Other (See Comments)    . Trazodone Other (See Comments) and Anxiety    Dizziness Dizziness    Past Surgical History:  Procedure Laterality Date  . CYST EXCISION Left 2017   foot  . HAND SURGERY  07/2012    Social History  Substance Use Topics  . Smoking status: Never Smoker  . Smokeless tobacco: Never Used  . Alcohol use No     Medication list has been reviewed and updated.   Physical Exam  Constitutional: She is oriented to person, place, and time. She appears well-developed. No distress.  HENT:  Head: Normocephalic and atraumatic.  Mouth/Throat: Mucous membranes are not pale, dry and not cyanotic. No oral lesions.  Neck: Carotid bruit is not present.  Cardiovascular: Normal rate, regular rhythm and normal heart sounds.   Pulmonary/Chest: Effort normal and breath sounds normal. No respiratory distress.  Musculoskeletal: Normal range of motion.  Neurological: She is alert and oriented to person, place, and time.  Skin: Skin is warm and dry. No rash noted.  Psychiatric: She has a normal mood and affect. Her behavior is normal. Thought content normal.  Nursing note and vitals reviewed.   BP 128/82   Pulse 78   Temp 98 F (36.7 C)   Ht 5\' 4"  (1.626 m)   Wt 154 lb (69.9 kg)   SpO2 98%   BMI 26.43 kg/m   Assessment and Plan: 1. Controlled type 2 diabetes mellitus without complication, without long-term current use of insulin (HCC) controlled - Hemoglobin A1c  2. Essential hypertension stable  3. Ovarian failure Need adequate calcium and vitamin D - DG Bone Density; Future  4. Need for pneumococcal vaccination - Pneumococcal conjugate vaccine 13-valent IM  5. Dry mouth - Sjogrens syndrome-A extractable nuclear antibody - Sjogrens syndrome-B extractable nuclear antibody  6. Primary insomnia Use only 3-4 times per week - zaleplon (SONATA) 5 MG capsule; Take 1 capsule (5 mg total) by mouth at bedtime as needed for sleep.  Dispense: 90 capsule; Refill: 1   Meds ordered this  encounter  Medications  . zaleplon (SONATA) 5 MG capsule    Sig: Take 1 capsule (5 mg total) by mouth at bedtime as needed for sleep.    Dispense:  90 capsule    Refill:  [place], MD [facility]  10/10/2016

## 2016-10-11 LAB — HEMOGLOBIN A1C
ESTIMATED AVERAGE GLUCOSE: 283 mg/dL
HEMOGLOBIN A1C: 11.5 % — AB (ref 4.8–5.6)

## 2016-10-11 LAB — SJOGRENS SYNDROME-A EXTRACTABLE NUCLEAR ANTIBODY: ENA SSA (RO) Ab: <0.2 AI (ref 0.0–0.9)

## 2016-10-11 LAB — SJOGRENS SYNDROME-B EXTRACTABLE NUCLEAR ANTIBODY

## 2016-10-15 ENCOUNTER — Telehealth: Payer: Self-pay

## 2016-10-15 NOTE — Telephone Encounter (Signed)
Pt wants BS monitor to monitor sugar at home since sugar was high. Freestyle Libre monitor send to [place].

## 2016-10-16 ENCOUNTER — Other Ambulatory Visit: Payer: Self-pay | Admitting: Internal Medicine

## 2016-10-16 MED ORDER — FREESTYLE FREEDOM LITE W/DEVICE KIT: 1.0000 | PACK | Freq: Every day | 0 refills | Status: DC

## 2016-10-16 MED ORDER — FREESTYLE LANCETS MISC: 12 refills | Status: DC

## 2016-10-16 MED ORDER — GLUCOSE BLOOD VI STRP: ORAL_STRIP | 12 refills | Status: DC

## 2016-10-16 NOTE — Telephone Encounter (Signed)
Informed pt of sent Rx's.

## 2016-10-16 NOTE — Telephone Encounter (Signed)
I sent in glucometer, test strips and lancets with refills for one year.

## 2016-10-21 ENCOUNTER — Other Ambulatory Visit: Payer: Self-pay

## 2016-10-21 ENCOUNTER — Other Ambulatory Visit: Payer: Self-pay | Admitting: Internal Medicine

## 2016-10-21 ENCOUNTER — Telehealth: Payer: Self-pay

## 2016-10-21 MED ORDER — FREESTYLE LIBRE SENSOR SYSTEM MISC: 1.0000 | Freq: Every day | 5 refills | Status: DC

## 2016-10-21 MED ORDER — FREESTYLE LIBRE READER DEVI: 1.0000 | Freq: Every day | 0 refills | Status: DC

## 2016-10-21 NOTE — Telephone Encounter (Signed)
I sent in the sensor and device.  If insurance does not cover it, I will not be able to get it approved.

## 2016-10-21 NOTE — Telephone Encounter (Signed)
Pt came into office saying we sent wrong monitor to pharmacy for DM testing. Needs two things sent to pharmacy: [facility] Reader, and [employer] to [place]

## 2016-10-22 ENCOUNTER — Telehealth: Payer: Self-pay

## 2016-10-22 ENCOUNTER — Other Ambulatory Visit: Payer: Self-pay | Admitting: Internal Medicine

## 2016-10-22 MED ORDER — FREESTYLE LIBRE READER DEVI: 1.0000 | Freq: Every day | 0 refills | Status: DC

## 2016-10-22 MED ORDER — FREESTYLE LIBRE SENSOR SYSTEM MISC: 1.0000 | Freq: Every day | 5 refills | Status: DC

## 2016-10-22 NOTE — Telephone Encounter (Signed)
Informed pt. Told her if insurance won't cover, then we can't get it approved for her.

## 2016-10-22 NOTE — Telephone Encounter (Signed)
Pt stated the reason she just now telling about sleep medication because she thought it was sent to [facility] and was awaiting Rx. When called here she was told [facility] because [facility], but I called [facility] personally and they say they did receive medication.

## 2016-10-22 NOTE — Telephone Encounter (Signed)
Pt called stating Pharmacy never received medication SONATA 5 mg. I called pharm and they told me they never received it. Could you resend for pt to [place]? Also, Pharmacy stated when we sent in her [facility] reader, and sensor we did not put Diagnostic code. They need it resent written and faxed with diagnostic code written on it.

## 2016-10-22 NOTE — Telephone Encounter (Signed)
To be clear, she has the medication waiting [facility] at [facility]?

## 2016-10-22 NOTE — Telephone Encounter (Signed)
I sent Sonata #90 on 10/10/16 with one refill.  Why is she just now saying she did not get it?? Also, I resent the glucose system again which is useless cause medicare won't cover it since she is not on insulin.

## 2016-10-23 NOTE — Telephone Encounter (Signed)
Will you please just call in a 90 day supply?  Thanks

## 2016-10-23 NOTE — Telephone Encounter (Signed)
Done

## 2016-10-23 NOTE — Telephone Encounter (Signed)
No ma'am. [facility] said they never received the RX even tho I see here in epic you sent it.

## 2016-11-03 ENCOUNTER — Telehealth: Payer: Self-pay

## 2016-11-03 ENCOUNTER — Ambulatory Visit
Admission: RE | Admit: 2016-11-03 | Discharge: 2016-11-03 | Disposition: A | Discharge status: Home / Self Care | Payer: Medicare Other | Source: Ambulatory Visit | Attending: Internal Medicine | Admitting: Internal Medicine

## 2016-11-03 DIAGNOSIS — E2839 Other primary ovarian failure: Secondary | ICD-10-CM | POA: Insufficient documentation

## 2016-11-03 DIAGNOSIS — Z13820 Encounter for screening for osteoporosis: Secondary | ICD-10-CM | POA: Diagnosis not present

## 2016-11-03 DIAGNOSIS — M8580 Other specified disorders of bone density and structure, unspecified site: Secondary | ICD-10-CM | POA: Insufficient documentation

## 2016-11-03 DIAGNOSIS — M85852 Other specified disorders of bone density and structure, left thigh: Secondary | ICD-10-CM | POA: Diagnosis not present

## 2016-11-03 NOTE — Telephone Encounter (Signed)
She was already supposed to be taking Metformin 1000 mg twice a day (that is the max dose).

## 2016-11-03 NOTE — Telephone Encounter (Signed)
Pt called asking if she can increase her metformin since her A1C is so bad? She is currently doing 1000 mg daily.

## 2016-11-04 NOTE — Telephone Encounter (Signed)
Pt informed

## 2016-12-11 ENCOUNTER — Ambulatory Visit: Payer: Medicare Other | Admitting: Internal Medicine

## 2017-02-10 ENCOUNTER — Encounter: Payer: Self-pay | Admitting: Internal Medicine

## 2017-02-10 ENCOUNTER — Ambulatory Visit (INDEPENDENT_AMBULATORY_CARE_PROVIDER_SITE_OTHER): Payer: Medicare Other | Admitting: Internal Medicine

## 2017-02-10 VITALS — BP 118/76 | HR 73 | Ht 64.0 in | Wt 146.0 lb

## 2017-02-10 DIAGNOSIS — I10 Essential (primary) hypertension: Secondary | ICD-10-CM

## 2017-02-10 DIAGNOSIS — E782 Mixed hyperlipidemia: Secondary | ICD-10-CM | POA: Diagnosis not present

## 2017-02-10 DIAGNOSIS — E118 Type 2 diabetes mellitus with unspecified complications: Secondary | ICD-10-CM | POA: Insufficient documentation

## 2017-02-10 DIAGNOSIS — E119 Type 2 diabetes mellitus without complications: Secondary | ICD-10-CM

## 2017-02-10 NOTE — Progress Notes (Signed)
Date:  02/10/2017   Name:  Jenna Stein   DOB:  May 04, 1951   MRN:  [id number]   Chief Complaint: Diabetes (Testing at home with a scan device and 2 blood glucose tests. She states all three tests give different results at same time. It confuses her. Pt states she has been feeling okay and exercising everyday now for 30 mins. ) Diabetes  She presents for her follow-up diabetic visit. She has type 2 diabetes mellitus. Her disease course has been worsening. Pertinent negatives for hypoglycemia include no headaches or tremors. Pertinent negatives for diabetes include no chest pain, no fatigue, no polydipsia and no polyuria. Symptoms are stable. Current diabetic treatment includes oral agent (dual therapy) Celesta Gentile and metformin). Her weight is decreasing steadily. She is following a diabetic diet. She participates in exercise daily. Her breakfast blood glucose is taken between 6-7 am. Her breakfast blood glucose range is generally 110-130 mg/dl. Her dinner blood glucose is taken between 7-8 pm. Her dinner blood glucose range is generally 140-180 mg/dl. An ACE inhibitor/angiotensin II receptor blocker is being taken.  Hypertension  This is a chronic problem. The problem is controlled. Pertinent negatives include no chest pain, headaches, palpitations or shortness of breath. Past treatments include angiotensin blockers, beta blockers and diuretics. The current treatment provides significant improvement.    Lab Results  Component Value Date   HGBA1C 11.5 (H) 10/10/2016     Review of Systems  Constitutional: Negative for appetite change, fatigue and fever. Unexpected weight change: has lost 9 lbs with diet and exercise.  HENT: Negative for tinnitus and trouble swallowing.   Eyes: Negative for visual disturbance.  Respiratory: Negative for cough, chest tightness and shortness of breath.   Cardiovascular: Negative for chest pain, palpitations and leg swelling.  Gastrointestinal: Negative for  abdominal pain.  Endocrine: Negative for polydipsia and polyuria.  Genitourinary: Negative for dysuria and hematuria.  Musculoskeletal: Negative for arthralgias.  Neurological: Negative for tremors, numbness and headaches.  Psychiatric/Behavioral: Negative for dysphoric mood.    Patient Active Problem List   Diagnosis Date Noted  . Controlled type 2 diabetes mellitus without complication, without long-term current use of insulin ([place]) 02/10/2017  . Dry mouth 10/10/2016  . Ovarian failure 10/10/2016  . Abnormal ECG 04/30/2015  . Allergic rhinitis, seasonal 04/30/2015  . Anxiety 04/30/2015  . Arthritis 04/30/2015  . Hepatic steatosis 04/30/2015  . Gastroesophageal reflux disease 04/30/2015  . Osteoarthritis of both hands 04/30/2015  . Blood in the urine 04/30/2015  . Genital herpes 04/04/2009  . Bone/cartilage disorder 08/04/2003  . Clinical depression 08/18/1998  . Essential hypertension 08/18/1998  . Hyperlipidemia 08/18/1998  . Insomnia 08/18/1998    Prior to Admission medications   Medication Sig Start Date End Date Taking? Authorizing Provider  acyclovir (ZOVIRAX) 200 MG capsule Take 1 capsule (200 mg total) by mouth 5 (five) times daily. 06/10/16  Yes Glean Hess, MD  Alpha-Lipoic Acid (NEOKE RA LIPOIC PO) Take 100 mg by mouth.   Yes [provider]  atenolol (TENORMIN) 50 MG tablet Take 1 tablet (50 mg total) by mouth daily. 06/10/16  Yes Glean Hess, MD  atorvastatin (LIPITOR) 10 MG tablet Take 1 tablet (10 mg total) by mouth at bedtime. 06/10/16  Yes Glean Hess, MD  Blood Glucose Monitoring Suppl (FREESTYLE FREEDOM LITE) w/Device KIT 1 each by Does not apply route daily. 10/16/16  Yes Glean Hess, MD  Cholecalciferol (VITAMIN D HIGH POTENCY) 1000 UNITS capsule Take 1,000 Units  by mouth daily.    Yes [provider]  Chromium (CHROMEMATE PO) Take 500 mg by mouth.   Yes [provider]  Coenzyme Q10 (CO Q 10) 100 MG CAPS  Take 1 capsule by mouth daily.    Yes [provider]  Continuous Blood Gluc Receiver (FREESTYLE LIBRE READER) DEVI 1 each by Does not apply route daily. 10/22/16  Yes Reubin Milan, MD  Continuous Blood Gluc Sensor (FREESTYLE LIBRE SENSOR SYSTEM) MISC 1 each by Does not apply route daily. One sensor every 10 days 10/22/16  Yes Reubin Milan, MD  cyclobenzaprine (FLEXERIL) 5 MG tablet Take 1 tablet (5 mg total) by mouth 3 (three) times daily as needed for muscle spasms. 06/10/16  Yes Reubin Milan, MD  glucose blood (FREESTYLE LITE) test strip Use to test BS once daily 10/16/16  Yes Reubin Milan, MD  Gymnema Sylvestris Leaf POWD by Does not apply route.   Yes [provider]  hydrochlorothiazide (HYDRODIURIL) 12.5 MG tablet Take 1 tablet (12.5 mg total) by mouth daily. 06/10/16  Yes Reubin Milan, MD  Lancets (FREESTYLE) lancets Use to test BS once daily 10/16/16  Yes Reubin Milan, MD  Lecithin 1200 MG CAPS Take 1 capsule by mouth daily.    Yes [provider]  loratadine (CLARITIN) 10 MG tablet Take 10 mg by mouth daily as needed.  11/21/13  Yes [provider]  Lutein 20 MG TABS Take 1 tablet by mouth daily.    Yes [provider]  LUTEIN-ZEAXANTHIN PO Take 4 mg by mouth.   Yes [provider]  metFORMIN (GLUCOPHAGE) 1000 MG tablet Take 1 tablet (1,000 mg total) by mouth 2 (two) times daily with a meal. 06/10/16  Yes Reubin Milan, MD  MULTIPLE VITAMIN PO 1 tablet daily.  04/04/09  Yes [provider]  olopatadine (PATANOL) 0.1 % ophthalmic solution Apply to eye.   Yes [provider]  Omega-3 Fatty Acids (FISH OIL PEARLS PO) Take by mouth.   Yes [provider]  Omeprazole 20 MG TBEC Take 1 tablet (20 mg total) by mouth daily. 06/10/16  Yes Reubin Milan, MD  PARoxetine (PAXIL) 20 MG tablet Take 1 tablet (20 mg total) by mouth daily. 06/10/16  Yes Reubin Milan, MD  sitaGLIPtin (JANUVIA)  100 MG tablet Take 1 tablet (100 mg total) by mouth daily. 06/10/16  Yes Reubin Milan, MD  valsartan (DIOVAN) 80 MG tablet Take 1 tablet (80 mg total) by mouth daily. 06/10/16  Yes Reubin Milan, MD  vitamin C (ASCORBIC ACID) 500 MG tablet Take 500 mg by mouth daily.   Yes [provider]  vitamin E (VITAMIN E) 400 UNIT capsule Take 1 capsule (400 Units total) by mouth daily. 09/05/15  Yes Lorie Phenix, MD    Allergies  Allergen Reactions  . Clonazepam Other (See Comments)    Nocturnal enuresis; Lethargy Nocturnal enuresis; Lethargy  . Meloxicam Other (See Comments)  . Trazodone Other (See Comments) and Anxiety    Dizziness Dizziness    Past Surgical History:  Procedure Laterality Date  . CYST EXCISION Left 2017   foot  . HAND SURGERY  07/2012    Social History  Substance Use Topics  . Smoking status: Never Smoker  . Smokeless tobacco: Never Used  . Alcohol use No     Medication list has been reviewed and updated.   Physical Exam  Constitutional: She is oriented to person, place, and  time. She appears well-developed. No distress.  HENT:  Head: Normocephalic and atraumatic.  Neck: Normal range of motion. Neck supple. Carotid bruit is not present.  Cardiovascular: Normal rate, regular rhythm and normal heart sounds.   Pulmonary/Chest: Effort normal and breath sounds normal. No respiratory distress. She has no wheezes.  Musculoskeletal: Normal range of motion.  Neurological: She is alert and oriented to person, place, and time.  Skin: Skin is warm and dry. No rash noted.  Psychiatric: She has a normal mood and affect. Her behavior is normal. Thought content normal.  Nursing note and vitals reviewed.   BP 118/76   Pulse 73   Ht 5' 4" (1.626 m)   Wt 146 lb (66.2 kg)   SpO2 98%   BMI 25.06 kg/m   Assessment and Plan: 1. Controlled type 2 diabetes mellitus without complication, without long-term current use of insulin (HCC) Doing better with diet  and exercise Continue Free Style Libre device for BS monitoring - Hemoglobin I7P - Basic metabolic panel  2. Essential hypertension controlled  3. Mixed hyperlipidemia On statin therapy   No orders of the defined types were placed in this encounter.   Halina Maidens, MD [facility]  02/10/2017

## 2017-02-11 LAB — BASIC METABOLIC PANEL
BUN / CREAT RATIO: 14 (ref 12–28)
BUN: 12 mg/dL (ref 8–27)
CALCIUM: 9.7 mg/dL (ref 8.7–10.3)
CHLORIDE: 96 mmol/L (ref 96–106)
CO2: 24 mmol/L (ref 20–29)
Creatinine, Ser: 0.84 mg/dL (ref 0.57–1.00)
GFR calc Af Amer: 84 mL/min/{1.73_m2} (ref >59)
GFR calc non Af Amer: 73 mL/min/{1.73_m2} (ref >59)
GLUCOSE: 96 mg/dL (ref 65–99)
POTASSIUM: 4.1 mmol/L (ref 3.5–5.2)
Sodium: 138 mmol/L (ref 134–144)

## 2017-02-11 LAB — HEMOGLOBIN A1C
ESTIMATED AVERAGE GLUCOSE: 126 mg/dL
Hgb A1c MFr Bld: 6 % — ABNORMAL HIGH (ref 4.8–5.6)

## 2017-02-16 ENCOUNTER — Ambulatory Visit (INDEPENDENT_AMBULATORY_CARE_PROVIDER_SITE_OTHER): Payer: Medicare Other

## 2017-02-16 VITALS — BP 132/80 | HR 80 | Temp 98.0°F | Resp 16 | Ht 64.0 in | Wt 148.8 lb

## 2017-02-16 DIAGNOSIS — Z0000 Encounter for general adult medical examination without abnormal findings: Secondary | ICD-10-CM | POA: Diagnosis not present

## 2017-02-16 DIAGNOSIS — Z114 Encounter for screening for human immunodeficiency virus [HIV]: Secondary | ICD-10-CM | POA: Diagnosis not present

## 2017-02-16 NOTE — Progress Notes (Signed)
Subjective:   Jenna Stein is a 66 y.o. female who presents for Medicare Annual (Subsequent) preventive examination.  Review of Systems:   Cardiac Risk Factors include: advanced age (>36mn, >>51women);hypertension;dyslipidemia;diabetes mellitus     Objective:     Vitals: BP 132/80 (BP Location: Right Arm, Patient Position: Sitting)   Pulse 80   Temp 98 F (36.7 C)   Resp 16   Ht _0  (1.626 m)   Wt 148 lb 12.8 oz (67.5 kg)   BMI 25.54 kg/m   Body mass index is 25.54 kg/m.   Tobacco History  Smoking Status  . Never Smoker  Smokeless Tobacco  . Never Used     Counseling given: Not Answered   Past Medical History:  Diagnosis Date  . Allergy   . Anxiety   . Diabetes mellitus without complication ([place]   . GERD (gastroesophageal reflux disease)   . Hyperlipidemia   . Hypertension    Past Surgical History:  Procedure Laterality Date  . CYST EXCISION Left 2017   foot  . HAND SURGERY  07/2012   Family History  Problem Relation Age of Onset  . Hypertension Mother   . Hyperlipidemia Mother   . Arthritis Mother   . Parkinsonism Father   . Arthritis Other   . Hypertension Other   . Hyperlipidemia Other   . Hypothyroidism Other   . Breast cancer Sister 64  History  Sexual Activity  . Sexual activity: Not on file    Outpatient Encounter Prescriptions as of 02/16/2017  Medication Sig  . acyclovir (ZOVIRAX) 200 MG capsule Take 1 capsule (200 mg total) by mouth 5 (five) times daily.  . Alpha-Lipoic Acid (NEOKE RA LIPOIC PO) Take 100 mg by mouth.  .Marland Kitchenatenolol (TENORMIN) 50 MG tablet Take 1 tablet (50 mg total) by mouth daily.  .Marland Kitchenatorvastatin (LIPITOR) 10 MG tablet Take 1 tablet (10 mg total) by mouth at bedtime.  . Blood Glucose Monitoring Suppl (FREESTYLE FREEDOM LITE) w/Device KIT 1 each by Does not apply route daily.  . Cholecalciferol (VITAMIN D HIGH POTENCY) 1000 UNITS capsule Take 1,000 Units by mouth daily.   . Chromium (CHROMEMATE PO) Take 500 mg by  mouth.  . Coenzyme Q10 (CO Q 10) 100 MG CAPS Take 1 capsule by mouth daily.   . Continuous Blood Gluc Receiver (FREESTYLE LIBRE READER) DEVI 1 each by Does not apply route daily.  . Continuous Blood Gluc Sensor ([place] Tree MISC 1 each by Does not apply route daily. One sensor every 10 days  . cyclobenzaprine (FLEXERIL) 5 MG tablet Take 1 tablet (5 mg total) by mouth 3 (three) times daily as needed for muscle spasms.  .Marland Kitchenglucose blood (FREESTYLE LITE) test strip Use to test BS once daily  . Gymnema Sylvestris Leaf POWD by Does not apply route.  . hydrochlorothiazide (HYDRODIURIL) 12.5 MG tablet Take 1 tablet (12.5 mg total) by mouth daily.  . Lancets (FREESTYLE) lancets Use to test BS once daily  . Lecithin 1200 MG CAPS Take 1 capsule by mouth daily.   .Marland Kitchenloratadine (CLARITIN) 10 MG tablet Take 10 mg by mouth daily as needed.   . Lutein 20 MG TABS Take 1 tablet by mouth daily.   . LUTEIN-ZEAXANTHIN PO Take 4 mg by mouth.  . metFORMIN (GLUCOPHAGE) 1000 MG tablet Take 1 tablet (1,000 mg total) by mouth 2 (two) times daily with a meal.  . MULTIPLE VITAMIN PO 1 tablet daily.   .[name]  olopatadine (PATANOL) 0.1 % ophthalmic solution Apply to eye.  . Omega-3 Fatty Acids (FISH OIL PEARLS PO) Take by mouth.  . Omeprazole 20 MG TBEC Take 1 tablet (20 mg total) by mouth daily.  [name] PARoxetine (PAXIL) 20 MG tablet Take 1 tablet (20 mg total) by mouth daily.  . sitaGLIPtin (JANUVIA) 100 MG tablet Take 1 tablet (100 mg total) by mouth daily.  . valsartan (DIOVAN) 80 MG tablet Take 1 tablet (80 mg total) by mouth daily.  . vitamin C (ASCORBIC ACID) 500 MG tablet Take 500 mg by mouth daily.  . vitamin E (VITAMIN E) 400 UNIT capsule Take 1 capsule (400 Units total) by mouth daily.   No facility-administered encounter medications on file as of 02/16/2017.     Activities of Daily Living In your present state of health, do you have any difficulty performing the following activities: 02/16/2017  06/10/2016  Hearing? N N  Vision? N N  Difficulty concentrating or making decisions? N N  Walking or climbing stairs? N N  Dressing or bathing? N N  Doing errands, shopping? N N  Preparing Food and eating ? N -  Using the Toilet? N -  In the past six months, have you accidently leaked urine? N -  Do you have problems with loss of bowel control? N -  Managing your Medications? N -  Managing your Finances? N -  Housekeeping or managing your Housekeeping? N -  Some recent data might be hidden    Patient Care Team: Glean Hess, MD as PCP - General (Family Medicine)    Assessment:     Exercise Activities and Dietary recommendations Current Exercise Habits: Home exercise routine, Type of exercise: walking;strength training/weights (bike), Time (Minutes): 30, Intensity: Mild, Exercise limited by: None identified  Goals    . Increase water intake          Recommend drinking at least 4-5 glasses of water a day      Fall Risk Fall Risk  02/16/2017 02/10/2017 12/26/2015  Falls in the past year? No No No   Depression Screen PHQ 2/9 Scores 02/16/2017 02/10/2017 06/10/2016 12/26/2015  PHQ - 2 Score 0 0 0 0     Cognitive Function     6CIT Screen 02/16/2017  What Year? 0 points  What month? 0 points  What time? 0 points  Count back from 20 0 points  Months in reverse 0 points  Repeat phrase 0 points  Total Score 0    Immunization History  Administered Date(s) Administered  . Influenza Split 04/18/2011, 05/24/2012  . Influenza,inj,Quad PF,36+ Mos 05/02/2013, 05/03/2014, 05/09/2015  . Influenza,inj,quad, With Preservative 07/18/2016  . Influenza-Unspecified 05/01/2016  . Pneumococcal Conjugate-13 10/10/2016  . Td 08/04/2003  . Tdap 08/04/2003, 04/18/2011  . Zoster 01/12/2013   Screening Tests Health Maintenance  Topic Date Due  . OPHTHALMOLOGY EXAM  02/17/2017  . INFLUENZA VACCINE  03/18/2017  . PAP SMEAR  04/23/2017  . FOOT EXAM  06/10/2017  . HEMOGLOBIN A1C   08/12/2017  . PNA vac Low Risk Adult (2 of 2 - PPSV23) 10/10/2017  . MAMMOGRAM  08/26/2018  . TETANUS/TDAP  04/17/2021  . COLONOSCOPY  07/22/2021  . DEXA SCAN  Completed  . Hepatitis C Screening  Completed  . HIV Screening  Completed      Plan:    I have personally reviewed and addressed the Medicare Annual Wellness questionnaire and have noted the following in the patient's chart:  A. Medical and social history  B. Use of alcohol, tobacco or illicit drugs  C. Current medications and supplements D. Functional ability and status E.  Nutritional status F.  Physical activity G. Advance directives H. List of other physicians I.  Hospitalizations, surgeries, and ER visits in previous 12 months J.  [place] such as hearing and vision if needed, cognitive and depression L. Referrals and appointments  In addition, I have reviewed and discussed with patient certain preventive protocols, quality metrics, and best practice recommendations. A written personalized care plan for preventive services as well as general preventive health recommendations were provided to patient.   Signed,  Tyler Aas, LPN Nurse Health Advisor   MD Recommendations: none

## 2017-02-16 NOTE — Patient Instructions (Addendum)
Jenna Stein , Thank you for taking time to come for your Medicare Wellness Visit. I appreciate your ongoing commitment to your health goals. Please review the following plan we discussed and let me know if I can assist you in the future.   Screening recommendations/referrals: Colonoscopy: Completed 07/23/2011, due 07/2021 Mammogram: Completed 08/27/2016 Bone Density: Completed 11/03/2016 Recommended yearly ophthalmology/optometry visit for glaucoma screening and checkup Recommended yearly dental visit for hygiene and checkup  Vaccinations: Influenza vaccine: Up to date, due 04/2017 Pneumococcal vaccine: Up to date, next vaccine due 10/2017 Tdap vaccine: up to date Shingles vaccine: up to date  Advanced directives:  Please bring a copy of your health care power of attorney and living will to the office at your convenience.  Conditions/risks identified: recommend drinking at least 4-5 glasses of water a day  Next appointment: Follow up on 08/07/2017 at 10:30am with Dr.Berglund. Follow up in one year for your annual wellness exam.     Preventive Care 65 Years and Older, Female Preventive care refers to lifestyle choices and visits with your health care provider that can promote health and wellness. What does preventive care include?  A yearly physical exam. This is also called an annual well check.  Dental exams once or twice a year.  Routine eye exams. Ask your health care provider how often you should have your eyes checked.  Personal lifestyle choices, including:  Daily care of your teeth and gums.  Regular physical activity.  Eating a healthy diet.  Avoiding tobacco and drug use.  Limiting alcohol use.  Practicing safe sex.  Taking low-dose aspirin every day.  Taking vitamin and mineral supplements as recommended by your health care provider. What happens during an annual well check? The services and screenings done by your health care provider during your annual well  check will depend on your age, overall health, lifestyle risk factors, and family history of disease. Counseling  Your health care provider may ask you questions about your:  Alcohol use.  Tobacco use.  Drug use.  Emotional well-being.  Home and relationship well-being.  Sexual activity.  Eating habits.  History of falls.  Memory and ability to understand (cognition).  Work and work Statistician.  Reproductive health. Screening  You may have the following tests or measurements:  Height, weight, and BMI.  Blood pressure.  Lipid and cholesterol levels. These may be checked every 5 years, or more frequently if you are over 52 years old.  Skin check.  Lung cancer screening. You may have this screening every year starting at age 47 if you have a 30-pack-year history of smoking and currently smoke or have quit within the past 15 years.  Fecal occult blood test (FOBT) of the stool. You may have this test every year starting at age 7.  Flexible sigmoidoscopy or colonoscopy. You may have a sigmoidoscopy every 5 years or a colonoscopy every 10 years starting at age 49.  Hepatitis C blood test.  Hepatitis B blood test.  Sexually transmitted disease (STD) testing.  Diabetes screening. This is done by checking your blood sugar (glucose) after you have not eaten for a while (fasting). You may have this done every 1-3 years.  Bone density scan. This is done to screen for osteoporosis. You may have this done starting at age 57.  Mammogram. This may be done every 1-2 years. Talk to your health care provider about how often you should have regular mammograms. Talk with your health care provider about your test results,  treatment options, and if necessary, the need for more tests. Vaccines  Your health care provider may recommend certain vaccines, such as:  Influenza vaccine. This is recommended every year.  Tetanus, diphtheria, and acellular pertussis (Tdap, Td) vaccine. You  may need a Td booster every 10 years.  Zoster vaccine. You may need this after age 50.  Pneumococcal 13-valent conjugate (PCV13) vaccine. One dose is recommended after age 53.  Pneumococcal polysaccharide (PPSV23) vaccine. One dose is recommended after age 101. Talk to your health care provider about which screenings and vaccines you need and how often you need them. This information is not intended to replace advice given to you by your health care provider. Make sure you discuss any questions you have with your health care provider. Document Released: 08/31/2015 Document Revised: 04/23/2016 Document Reviewed: 06/05/2015 Elsevier Interactive Patient Education  2017 [employer] Prevention in the Home Falls can cause injuries. They can happen to people of all ages. There are many things you can do to make your home safe and to help prevent falls. What can I do on the outside of my home?  Regularly fix the edges of walkways and driveways and fix any cracks.  Remove anything that might make you trip as you walk through a door, such as a raised step or threshold.  Trim any bushes or trees on the path to your home.  Use bright outdoor lighting.  Clear any walking paths of anything that might make someone trip, such as rocks or tools.  Regularly check to see if handrails are loose or broken. Make sure that both sides of any steps have handrails.  Any raised decks and porches should have guardrails on the edges.  Have any leaves, snow, or ice cleared regularly.  Use sand or salt on walking paths during winter.  Clean up any spills in your garage right away. This includes oil or grease spills. What can I do in the bathroom?  Use night lights.  Install grab bars by the toilet and in the tub and shower. Do not use towel bars as grab bars.  Use non-skid mats or decals in the tub or shower.  If you need to sit down in the shower, use a plastic, non-slip stool.  Keep the floor  dry. Clean up any water that spills on the floor as soon as it happens.  Remove soap buildup in the tub or shower regularly.  Attach bath mats securely with double-sided non-slip rug tape.  Do not have throw rugs and other things on the floor that can make you trip. What can I do in the bedroom?  Use night lights.  Make sure that you have a light by your bed that is easy to reach.  Do not use any sheets or blankets that are too big for your bed. They should not hang down onto the floor.  Have a firm chair that has side arms. You can use this for support while you get dressed.  Do not have throw rugs and other things on the floor that can make you trip. What can I do in the kitchen?  Clean up any spills right away.  Avoid walking on wet floors.  Keep items that you use a lot in easy-to-reach places.  If you need to reach something above you, use a strong step stool that has a grab bar.  Keep electrical cords out of the way.  Do not use floor polish or wax that makes floors  slippery. If you must use wax, use non-skid floor wax.  Do not have throw rugs and other things on the floor that can make you trip. What can I do with my stairs?  Do not leave any items on the stairs.  Make sure that there are handrails on both sides of the stairs and use them. Fix handrails that are broken or loose. Make sure that handrails are as long as the stairways.  Check any carpeting to make sure that it is firmly attached to the stairs. Fix any carpet that is loose or worn.  Avoid having throw rugs at the top or bottom of the stairs. If you do have throw rugs, attach them to the floor with carpet tape.  Make sure that you have a light switch at the top of the stairs and the bottom of the stairs. If you do not have them, ask someone to add them for you. What else can I do to help prevent falls?  Wear shoes that:  Do not have high heels.  Have rubber bottoms.  Are comfortable and fit you  well.  Are closed at the toe. Do not wear sandals.  If you use a stepladder:  Make sure that it is fully opened. Do not climb a closed stepladder.  Make sure that both sides of the stepladder are locked into place.  Ask someone to hold it for you, if possible.  Clearly mark and make sure that you can see:  Any grab bars or handrails.  First and last steps.  Where the edge of each step is.  Use tools that help you move around (mobility aids) if they are needed. These include:  Canes.  Walkers.  Scooters.  Crutches.  Turn on the lights when you go into a dark area. Replace any light bulbs as soon as they burn out.  Set up your furniture so you have a clear path. Avoid moving your furniture around.  If any of your floors are uneven, fix them.  If there are any pets around you, be aware of where they are.  Review your medicines with your doctor. Some medicines can make you feel dizzy. This can increase your chance of falling. Ask your doctor what other things that you can do to help prevent falls. This information is not intended to replace advice given to you by your health care provider. Make sure you discuss any questions you have with your health care provider. Document Released: 05/31/2009 Document Revised: 01/10/2016 Document Reviewed: 09/08/2014 Elsevier Interactive Patient Education  2017 [employer].

## 2017-02-17 LAB — HIV ANTIBODY (ROUTINE TESTING W REFLEX): HIV SCREEN 4TH GENERATION: NONREACTIVE

## 2017-04-07 DIAGNOSIS — E119 Type 2 diabetes mellitus without complications: Secondary | ICD-10-CM | POA: Diagnosis not present

## 2017-04-07 LAB — HM DIABETES EYE EXAM

## 2017-04-09 ENCOUNTER — Encounter: Payer: Self-pay | Admitting: Internal Medicine

## 2017-05-06 ENCOUNTER — Other Ambulatory Visit: Payer: Self-pay | Admitting: Internal Medicine

## 2017-05-18 ENCOUNTER — Other Ambulatory Visit: Payer: Self-pay

## 2017-05-18 DIAGNOSIS — F419 Anxiety disorder, unspecified: Secondary | ICD-10-CM

## 2017-05-18 DIAGNOSIS — E119 Type 2 diabetes mellitus without complications: Secondary | ICD-10-CM

## 2017-05-18 DIAGNOSIS — K219 Gastro-esophageal reflux disease without esophagitis: Secondary | ICD-10-CM

## 2017-05-18 DIAGNOSIS — E782 Mixed hyperlipidemia: Secondary | ICD-10-CM

## 2017-05-18 DIAGNOSIS — I10 Essential (primary) hypertension: Secondary | ICD-10-CM

## 2017-05-18 DIAGNOSIS — A6000 Herpesviral infection of urogenital system, unspecified: Secondary | ICD-10-CM

## 2017-05-18 MED ORDER — HYDROCHLOROTHIAZIDE 12.5 MG PO TABS: 12.5000 mg | ORAL_TABLET | Freq: Every day | ORAL | 3 refills | Status: DC

## 2017-05-18 MED ORDER — VALSARTAN 80 MG PO TABS: 80.0000 mg | ORAL_TABLET | Freq: Every day | ORAL | 3 refills | Status: DC

## 2017-05-18 MED ORDER — ATORVASTATIN CALCIUM 10 MG PO TABS: 10.0000 mg | ORAL_TABLET | Freq: Every day | ORAL | 3 refills | Status: DC

## 2017-05-18 MED ORDER — ACYCLOVIR 200 MG PO CAPS: 200.0000 mg | ORAL_CAPSULE | Freq: Every day | ORAL | 3 refills | Status: DC

## 2017-05-18 MED ORDER — PAROXETINE HCL 20 MG PO TABS: 20.0000 mg | ORAL_TABLET | Freq: Every day | ORAL | 3 refills | Status: DC

## 2017-05-18 MED ORDER — ATENOLOL 50 MG PO TABS: 50.0000 mg | ORAL_TABLET | Freq: Every day | ORAL | 3 refills | Status: DC

## 2017-05-18 MED ORDER — OMEPRAZOLE 20 MG PO TBEC: 20.0000 mg | DELAYED_RELEASE_TABLET | Freq: Every day | ORAL | 3 refills | Status: DC

## 2017-05-18 MED ORDER — METFORMIN HCL 1000 MG PO TABS: 1000.0000 mg | ORAL_TABLET | Freq: Two times a day (BID) | ORAL | 3 refills | Status: DC

## 2017-05-18 MED ORDER — GLUCOSE BLOOD VI STRP: ORAL_STRIP | 12 refills | Status: DC

## 2017-05-18 MED ORDER — SITAGLIPTIN PHOSPHATE 100 MG PO TABS: 100.0000 mg | ORAL_TABLET | Freq: Every day | ORAL | 3 refills | Status: DC

## 2017-06-06 ENCOUNTER — Other Ambulatory Visit: Payer: Self-pay | Admitting: Internal Medicine

## 2017-07-03 DIAGNOSIS — Z23 Encounter for immunization: Secondary | ICD-10-CM | POA: Diagnosis not present

## 2017-08-07 ENCOUNTER — Ambulatory Visit (INDEPENDENT_AMBULATORY_CARE_PROVIDER_SITE_OTHER): Payer: Medicare Other | Admitting: Internal Medicine

## 2017-08-07 ENCOUNTER — Encounter: Payer: Self-pay | Admitting: Internal Medicine

## 2017-08-07 ENCOUNTER — Other Ambulatory Visit: Payer: Self-pay | Admitting: Internal Medicine

## 2017-08-07 VITALS — BP 118/78 | HR 77 | Ht 64.0 in | Wt 148.0 lb

## 2017-08-07 DIAGNOSIS — K219 Gastro-esophageal reflux disease without esophagitis: Secondary | ICD-10-CM

## 2017-08-07 DIAGNOSIS — I10 Essential (primary) hypertension: Secondary | ICD-10-CM | POA: Diagnosis not present

## 2017-08-07 DIAGNOSIS — Z0000 Encounter for general adult medical examination without abnormal findings: Secondary | ICD-10-CM

## 2017-08-07 DIAGNOSIS — E119 Type 2 diabetes mellitus without complications: Secondary | ICD-10-CM

## 2017-08-07 DIAGNOSIS — Z1231 Encounter for screening mammogram for malignant neoplasm of breast: Secondary | ICD-10-CM

## 2017-08-07 DIAGNOSIS — K760 Fatty (change of) liver, not elsewhere classified: Secondary | ICD-10-CM | POA: Diagnosis not present

## 2017-08-07 DIAGNOSIS — F324 Major depressive disorder, single episode, in partial remission: Secondary | ICD-10-CM

## 2017-08-07 DIAGNOSIS — Z23 Encounter for immunization: Secondary | ICD-10-CM

## 2017-08-07 DIAGNOSIS — E782 Mixed hyperlipidemia: Secondary | ICD-10-CM

## 2017-08-07 LAB — POCT URINALYSIS DIPSTICK
Bilirubin, UA: NEGATIVE
Glucose, UA: NEGATIVE
KETONES UA: NEGATIVE
LEUKOCYTES UA: NEGATIVE
NITRITE UA: NEGATIVE
PH UA: 5 (ref 5.0–8.0)
PROTEIN UA: NEGATIVE
UROBILINOGEN UA: 0.2 U/dL

## 2017-08-07 NOTE — Progress Notes (Signed)
Date:  08/07/2017   Name:  Jenna Stein   DOB:  05-Nov-1950   MRN:  [id number]   Chief Complaint: Annual Exam (Breast Exam. ) Jenna Stein is a 66 y.o. female who presents today for her Complete Annual Exam. She feels well. She reports exercising riding bike. She reports she is sleeping well. Mammogram is up to date but due in January.  Diabetes  She presents for her follow-up diabetic visit. She has type 2 diabetes mellitus. Her disease course has been stable. Pertinent negatives for hypoglycemia include no dizziness, headaches, nervousness/anxiousness or tremors. Pertinent negatives for diabetes include no chest pain, no fatigue, no foot paresthesias, no polydipsia and no polyuria. Symptoms are stable. Current diabetic treatment includes oral agent (dual therapy) (metformin and januvia). She is compliant with treatment all of the time. Her weight is stable. She participates in exercise three times a week. She monitors blood glucose at home 3-4 x per day. Blood glucose monitoring compliance is excellent. An ACE inhibitor/angiotensin II receptor blocker is being taken. Eye exam is current.  Hypertension  This is a chronic problem. The problem is unchanged. The problem is controlled. Pertinent negatives include no chest pain, headaches, palpitations or shortness of breath. Past treatments include angiotensin blockers and diuretics.  Hyperlipidemia  This is a chronic problem. The problem is controlled. Recent lipid tests were reviewed and are normal. Pertinent negatives include no chest pain or shortness of breath. Current antihyperlipidemic treatment includes statins. The current treatment provides significant improvement of lipids.  Depression         This is a chronic problem.  The problem occurs intermittently.  Associated symptoms include insomnia (uses otc meds with good results).  Associated symptoms include no fatigue, no headaches, not sad and no suicidal ideas.  Past treatments include  SSRIs - Selective serotonin reuptake inhibitors and other medications.     Review of Systems  Constitutional: Negative for chills, fatigue and fever.  HENT: Negative for congestion, hearing loss, tinnitus, trouble swallowing and voice change.   Eyes: Negative for visual disturbance.  Respiratory: Negative for cough, chest tightness, shortness of breath and wheezing.   Cardiovascular: Negative for chest pain, palpitations and leg swelling.  Gastrointestinal: Negative for abdominal pain, constipation, diarrhea and vomiting.  Endocrine: Negative for polydipsia and polyuria.  Genitourinary: Negative for dysuria, frequency, genital sores, vaginal bleeding and vaginal discharge.  Musculoskeletal: Negative for arthralgias, gait problem and joint swelling.  Skin: Negative for color change and rash.  Neurological: Negative for dizziness, tremors, light-headedness and headaches.  Hematological: Negative for adenopathy. Does not bruise/bleed easily.  Psychiatric/Behavioral: Positive for depression and sleep disturbance. Negative for dysphoric mood and suicidal ideas. The patient has insomnia (uses otc meds with good results). The patient is not nervous/anxious.     Patient Active Problem List   Diagnosis Date Noted  . Controlled type 2 diabetes mellitus without complication, without long-term current use of insulin ([place]) 02/10/2017  . Dry mouth 10/10/2016  . Ovarian failure 10/10/2016  . Abnormal ECG 04/30/2015  . Allergic rhinitis, seasonal 04/30/2015  . Anxiety 04/30/2015  . Arthritis 04/30/2015  . Hepatic steatosis 04/30/2015  . Gastroesophageal reflux disease 04/30/2015  . Osteoarthritis of both hands 04/30/2015  . Blood in the urine 04/30/2015  . Genital herpes 04/04/2009  . Bone/cartilage disorder 08/04/2003  . Depression, major, single episode, in partial remission ([place]) 08/18/1998  . Essential hypertension 08/18/1998  . Hyperlipidemia 08/18/1998  . Insomnia 08/18/1998    Prior  to  Admission medications   Medication Sig Start Date End Date Taking? Authorizing Provider  acyclovir (ZOVIRAX) 200 MG capsule Take 1 capsule (200 mg total) by mouth 5 (five) times daily. 05/18/17  Yes Glean Hess, MD  Alpha-Lipoic Acid (NEOKE RA LIPOIC PO) Take 100 mg by mouth.   Yes [provider]  atenolol (TENORMIN) 50 MG tablet Take 1 tablet (50 mg total) by mouth daily. 05/18/17  Yes Glean Hess, MD  atorvastatin (LIPITOR) 10 MG tablet Take 1 tablet (10 mg total) by mouth at bedtime. 05/18/17  Yes Glean Hess, MD  Blood Glucose Monitoring Suppl (FREESTYLE FREEDOM LITE) w/Device KIT 1 each by Does not apply route daily. 10/16/16  Yes Glean Hess, MD  Cholecalciferol (VITAMIN D HIGH POTENCY) 1000 UNITS capsule Take 1,000 Units by mouth daily.    Yes [provider]  Chromium (CHROMEMATE PO) Take 500 mg by mouth.   Yes [provider]  Coenzyme Q10 (CO Q 10) 100 MG CAPS Take 1 capsule by mouth daily.    Yes [provider]  Continuous Blood Gluc Receiver (FREESTYLE LIBRE READER) DEVI 1 each by Does not apply route daily. 10/22/16  Yes Glean Hess, MD  Continuous Blood Gluc Receiver (FREESTYLE LIBRE READER) DEVI 1 EACH BY DOES NOT APPLY ROUTE DAILY. (NOT COVER BY [facility] 05/06/17  Yes Glean Hess, MD  Continuous Blood Gluc Sensor ([place]) MISC 1 EACH BY DOES NOT APPLY ROUTE DAILY. ONE SENSOR EVERY 10 DAYS 06/06/17  Yes Glean Hess, MD  cyclobenzaprine (FLEXERIL) 5 MG tablet Take 1 tablet (5 mg total) by mouth 3 (three) times daily as needed for muscle spasms. 06/10/16  Yes Glean Hess, MD  docusate sodium (COLACE) 100 MG capsule Take 100 mg by mouth 2 (two) times daily.   Yes [provider]  glucose blood (FREESTYLE LITE) test strip Use to test BS once daily 05/18/17  Yes Glean Hess, MD  Gymnema Sylvestris Leaf POWD by Does not apply route.   Yes [provider]    hydrochlorothiazide (HYDRODIURIL) 12.5 MG tablet Take 1 tablet (12.5 mg total) by mouth daily. 05/18/17  Yes Glean Hess, MD  Lancets (FREESTYLE) lancets Use to test BS once daily 10/16/16  Yes Glean Hess, MD  Lecithin 1200 MG CAPS Take 1 capsule by mouth daily.    Yes [provider]  loratadine (CLARITIN) 10 MG tablet Take 10 mg by mouth daily as needed.  11/21/13  Yes [provider]  Lutein 20 MG TABS Take 1 tablet by mouth daily.    Yes [provider]  LUTEIN-ZEAXANTHIN PO Take 4 mg by mouth.   Yes [provider]  metFORMIN (GLUCOPHAGE) 1000 MG tablet Take 1 tablet (1,000 mg total) by mouth 2 (two) times daily with a meal. 05/18/17  Yes Glean Hess, MD  MULTIPLE VITAMIN PO 1 tablet daily.  04/04/09  Yes [provider]  olopatadine (PATANOL) 0.1 % ophthalmic solution Apply to eye.   Yes [provider]  Omega-3 Fatty Acids (FISH OIL PEARLS PO) Take by mouth.   Yes [provider]  Omeprazole 20 MG TBEC Take 1 tablet (20 mg total) by mouth daily. 05/18/17  Yes Glean Hess, MD  PARoxetine (PAXIL) 20 MG tablet Take 1 tablet (20 mg total) by mouth daily. 05/18/17  Yes Glean Hess, MD  sitaGLIPtin (JANUVIA) 100 MG tablet Take 1 tablet (100 mg total) by mouth daily.  05/18/17  Yes Glean Hess, MD  valsartan (DIOVAN) 80 MG tablet Take 1 tablet (80 mg total) by mouth daily. 05/18/17  Yes Glean Hess, MD  vitamin C (ASCORBIC ACID) 500 MG tablet Take 500 mg by mouth daily.   Yes [provider]  vitamin E (VITAMIN E) 400 UNIT capsule Take 1 capsule (400 Units total) by mouth daily. 09/05/15  Yes Margarita Rana, MD    Allergies  Allergen Reactions  . Clonazepam Other (See Comments)    Nocturnal enuresis; Lethargy Nocturnal enuresis; Lethargy  . Meloxicam Other (See Comments)  . Trazodone Other (See Comments) and Anxiety    Dizziness Dizziness    Past Surgical History:  Procedure  Laterality Date  . CYST EXCISION Left 2017   foot  . HAND SURGERY  07/2012    Social History   Tobacco Use  . Smoking status: Never Smoker  . Smokeless tobacco: Never Used  Substance Use Topics  . Alcohol use: No  . Drug use: No     Medication list has been reviewed and updated.  PHQ 2/9 Scores 02/16/2017 02/10/2017 06/10/2016 12/26/2015  PHQ - 2 Score 0 0 0 0    Physical Exam  Constitutional: She is oriented to person, place, and time. She appears well-developed and well-nourished. No distress.  HENT:  Head: Normocephalic and atraumatic.  Right Ear: Tympanic membrane and ear canal normal.  Left Ear: Tympanic membrane and ear canal normal.  Nose: Right sinus exhibits no maxillary sinus tenderness. Left sinus exhibits no maxillary sinus tenderness.  Mouth/Throat: Uvula is midline and oropharynx is clear and moist.  Eyes: Conjunctivae and EOM are normal. Right eye exhibits no discharge. Left eye exhibits no discharge. No scleral icterus.  Neck: Normal range of motion. Carotid bruit is not present. No erythema present. No thyromegaly present.  Cardiovascular: Normal rate, regular rhythm, normal heart sounds and normal pulses.  Pulmonary/Chest: Effort normal. No respiratory distress. She has no wheezes. Right breast exhibits no mass, no nipple discharge, no skin change and no tenderness. Left breast exhibits no mass, no nipple discharge, no skin change and no tenderness.  Abdominal: Soft. Bowel sounds are normal. There is no hepatosplenomegaly. There is no tenderness. There is no CVA tenderness.  Musculoskeletal: Normal range of motion.  Lymphadenopathy:    She has no cervical adenopathy.    She has no axillary adenopathy.  Neurological: She is alert and oriented to person, place, and time. She has normal reflexes. No cranial nerve deficit or sensory deficit.  Skin: Skin is warm, dry and intact. No rash noted.  Psychiatric: She has a normal mood and affect. Her speech is normal and  behavior is normal. Thought content normal.  Nursing note and vitals reviewed.   BP 118/78   Pulse 77   Ht '5\' 4"'  (1.626 m)   Wt 148 lb (67.1 kg)   SpO2 100%   BMI 25.40 kg/m   Assessment and Plan: 1. Annual physical exam MAW completed  2. Essential hypertension controlled - CBC with Differential/Platelet  3. Hepatic steatosis Check labs  4. Gastroesophageal reflux disease, esophagitis presence not specified controlled - CBC with Differential/Platelet  5. Controlled type 2 diabetes mellitus without complication, without long-term current use of insulin ([place]) Doing well on oral agents - Comprehensive metabolic panel - Hemoglobin A1c - POCT urinalysis dipstick - Microalbumin / creatinine urine ratio  6. Depression, major, single episode, in partial remission ([place]) controlled - TSH  7. Mixed hyperlipidemia On statin therapy - Lipid  panel  8. Need for vaccination for Strep pneumoniae - Pneumococcal polysaccharide vaccine 23-valent greater than or equal to 2yo subcutaneous/IM  9. Encounter for screening mammogram for breast cancer - MM DIGITAL SCREENING BILATERAL; Future   No orders of the defined types were placed in this encounter.   Partially dictated using Editor, commissioning. Any errors are unintentional.  Halina Maidens, MD [facility]  08/07/2017

## 2017-08-07 NOTE — Patient Instructions (Addendum)

## 2017-08-08 LAB — COMPREHENSIVE METABOLIC PANEL
A/G RATIO: 1.7 (ref 1.2–2.2)
ALT: 23 IU/L (ref 0–32)
AST: 25 IU/L (ref 0–40)
Albumin: 4.7 g/dL (ref 3.6–4.8)
Alkaline Phosphatase: 65 IU/L (ref 39–117)
BUN/Creatinine Ratio: 12 (ref 12–28)
BUN: 11 mg/dL (ref 8–27)
Bilirubin Total: 0.8 mg/dL (ref 0.0–1.2)
CALCIUM: 9.8 mg/dL (ref 8.7–10.3)
CO2: 25 mmol/L (ref 20–29)
CREATININE: 0.89 mg/dL (ref 0.57–1.00)
Chloride: 99 mmol/L (ref 96–106)
GFR calc Af Amer: 78 mL/min/{1.73_m2} (ref >59)
GFR, EST NON AFRICAN AMERICAN: 68 mL/min/{1.73_m2} (ref >59)
Globulin, Total: 2.7 g/dL (ref 1.5–4.5)
Glucose: 102 mg/dL — ABNORMAL HIGH (ref 65–99)
POTASSIUM: 4.2 mmol/L (ref 3.5–5.2)
Sodium: 142 mmol/L (ref 134–144)
Total Protein: 7.4 g/dL (ref 6.0–8.5)

## 2017-08-08 LAB — HEMOGLOBIN A1C
ESTIMATED AVERAGE GLUCOSE: 128 mg/dL
Hgb A1c MFr Bld: 6.1 % — ABNORMAL HIGH (ref 4.8–5.6)

## 2017-08-08 LAB — CBC WITH DIFFERENTIAL/PLATELET
BASOS: 1 %
Basophils Absolute: 0.1 10*3/uL (ref 0.0–0.2)
EOS (ABSOLUTE): 0.4 10*3/uL (ref 0.0–0.4)
EOS: 6 %
HEMATOCRIT: 38.7 % (ref 34.0–46.6)
Hemoglobin: 13.3 g/dL (ref 11.1–15.9)
IMMATURE GRANS (ABS): 0 10*3/uL (ref 0.0–0.1)
IMMATURE GRANULOCYTES: 0 %
LYMPHS: 35 %
Lymphocytes Absolute: 2.5 10*3/uL (ref 0.7–3.1)
MCH: 30.1 pg (ref 26.6–33.0)
MCHC: 34.4 g/dL (ref 31.5–35.7)
MCV: 88 fL (ref 79–97)
Monocytes Absolute: 0.5 10*3/uL (ref 0.1–0.9)
Monocytes: 7 %
NEUTROS PCT: 51 %
Neutrophils Absolute: 3.5 10*3/uL (ref 1.4–7.0)
PLATELETS: 273 10*3/uL (ref 150–379)
RBC: 4.42 x10E6/uL (ref 3.77–5.28)
RDW: 13.9 % (ref 12.3–15.4)
WBC: 7 10*3/uL (ref 3.4–10.8)

## 2017-08-08 LAB — LIPID PANEL
CHOLESTEROL TOTAL: 168 mg/dL (ref 100–199)
Chol/HDL Ratio: 3.4 ratio (ref 0.0–4.4)
HDL: 49 mg/dL (ref >39)
LDL CALC: 93 mg/dL (ref 0–99)
TRIGLYCERIDES: 129 mg/dL (ref 0–149)
VLDL CHOLESTEROL CAL: 26 mg/dL (ref 5–40)

## 2017-08-08 LAB — TSH: TSH: 1.68 u[IU]/mL (ref 0.450–4.500)

## 2017-08-14 LAB — MICROALBUMIN / CREATININE URINE RATIO
CREATININE, UR: 254.2 mg/dL
MICROALB/CREAT RATIO: 17.5 mg/g{creat} (ref 0.0–30.0)
Microalbumin, Urine: 44.5 ug/mL

## 2017-09-07 ENCOUNTER — Ambulatory Visit
Admission: RE | Admit: 2017-09-07 | Discharge: 2017-09-07 | Disposition: A | Discharge status: Home / Self Care | Payer: Medicare Other | Source: Ambulatory Visit | Attending: Internal Medicine | Admitting: Internal Medicine

## 2017-09-07 DIAGNOSIS — Z1231 Encounter for screening mammogram for malignant neoplasm of breast: Secondary | ICD-10-CM

## 2017-11-10 ENCOUNTER — Telehealth: Payer: Self-pay

## 2017-11-10 ENCOUNTER — Other Ambulatory Visit: Payer: Self-pay | Admitting: Internal Medicine

## 2017-11-10 DIAGNOSIS — E119 Type 2 diabetes mellitus without complications: Secondary | ICD-10-CM

## 2017-11-10 NOTE — Telephone Encounter (Signed)
Patient called stating she wanted to get labs drawn before her appt next week so she can discuss them at the f/up. She would like her BS checked along with AST/ALT labs. Will come tomorrow to have drawn.  Please Advise.

## 2017-11-12 ENCOUNTER — Other Ambulatory Visit: Payer: Self-pay

## 2017-11-12 DIAGNOSIS — E119 Type 2 diabetes mellitus without complications: Secondary | ICD-10-CM

## 2017-11-13 LAB — HEMOGLOBIN A1C
Est. average glucose Bld gHb Est-mCnc: 126 mg/dL
Hgb A1c MFr Bld: 6 % — ABNORMAL HIGH (ref 4.8–5.6)

## 2017-11-13 LAB — COMPREHENSIVE METABOLIC PANEL
ALBUMIN: 4.6 g/dL (ref 3.6–4.8)
ALT: 33 IU/L — ABNORMAL HIGH (ref 0–32)
AST: 28 IU/L (ref 0–40)
Albumin/Globulin Ratio: 1.8 (ref 1.2–2.2)
Alkaline Phosphatase: 61 IU/L (ref 39–117)
BILIRUBIN TOTAL: 0.7 mg/dL (ref 0.0–1.2)
BUN / CREAT RATIO: 16 (ref 12–28)
BUN: 15 mg/dL (ref 8–27)
CALCIUM: 9.7 mg/dL (ref 8.7–10.3)
CHLORIDE: 103 mmol/L (ref 96–106)
CO2: 24 mmol/L (ref 20–29)
CREATININE: 0.94 mg/dL (ref 0.57–1.00)
GFR, EST AFRICAN AMERICAN: 73 mL/min/{1.73_m2} (ref >59)
GFR, EST NON AFRICAN AMERICAN: 63 mL/min/{1.73_m2} (ref >59)
GLUCOSE: 134 mg/dL — AB (ref 65–99)
Globulin, Total: 2.6 g/dL (ref 1.5–4.5)
Potassium: 4.3 mmol/L (ref 3.5–5.2)
Sodium: 142 mmol/L (ref 134–144)
TOTAL PROTEIN: 7.2 g/dL (ref 6.0–8.5)

## 2017-11-18 ENCOUNTER — Ambulatory Visit (INDEPENDENT_AMBULATORY_CARE_PROVIDER_SITE_OTHER): Payer: Medicare Other | Admitting: Internal Medicine

## 2017-11-18 ENCOUNTER — Encounter: Payer: Self-pay | Admitting: Internal Medicine

## 2017-11-18 VITALS — BP 124/80 | HR 94 | Ht 64.0 in | Wt 156.0 lb

## 2017-11-18 DIAGNOSIS — F324 Major depressive disorder, single episode, in partial remission: Secondary | ICD-10-CM

## 2017-11-18 DIAGNOSIS — K760 Fatty (change of) liver, not elsewhere classified: Secondary | ICD-10-CM | POA: Diagnosis not present

## 2017-11-18 DIAGNOSIS — E119 Type 2 diabetes mellitus without complications: Secondary | ICD-10-CM

## 2017-11-18 DIAGNOSIS — F5101 Primary insomnia: Secondary | ICD-10-CM

## 2017-11-18 MED ORDER — ZALEPLON 10 MG PO CAPS: 10.0000 mg | ORAL_CAPSULE | Freq: Every evening | ORAL | 2 refills | Status: DC | PRN

## 2017-11-18 NOTE — Progress Notes (Signed)
Date:  11/18/2017   Name:  Jenna Stein   DOB:  12/20/1950   MRN:  [id number]   Chief Complaint: Diabetes (Patient had labs drawn last week so she can discuss results with doctor while here today.)  Diabetes  She presents for her follow-up diabetic visit. She has type 2 diabetes mellitus. Her disease course has been stable. Pertinent negatives for hypoglycemia include no headaches or tremors. Pertinent negatives for diabetes include no chest pain, no fatigue, no polydipsia and no polyuria. Current diabetic treatment includes oral agent (dual therapy) (on januvia and metformin). Her weight is stable. She monitors blood glucose at home 5+ x per day. There is no change (highest 190 and lowest 70) in her home blood glucose trend. An ACE inhibitor/angiotensin II receptor blocker is being taken. Eye exam is current.  Insomnia  Primary symptoms: difficulty falling asleep.  The current episode started more than one year. The problem occurs intermittently. PMH includes: depression.  Depression         This is a chronic problem.The problem is unchanged.  Associated symptoms include insomnia.  Associated symptoms include no fatigue, no appetite change and no headaches.  Past treatments include SSRIs - Selective serotonin reuptake inhibitors.  Compliance with treatment is good.  Previous treatment provided significant relief. Hepatic steatosis - following low fat diet, maintaining healthy weight and exercising regularly.   Lab Results  Component Value Date   HGBA1C 6.0 (H) 11/12/2017   Lab Results  Component Value Date   CREATININE 0.94 11/12/2017   BUN 15 11/12/2017   NA 142 11/12/2017   K 4.3 11/12/2017   CL 103 11/12/2017   CO2 24 11/12/2017   Lab Results  Component Value Date   ALT 33 (H) 11/12/2017   AST 28 11/12/2017   ALKPHOS 61 11/12/2017   BILITOT 0.7 11/12/2017      Review of Systems  Constitutional: Negative for appetite change, fatigue, fever and unexpected weight change.   HENT: Negative for tinnitus and trouble swallowing.   Eyes: Negative for visual disturbance.  Respiratory: Negative for cough, chest tightness and shortness of breath.   Cardiovascular: Negative for chest pain, palpitations and leg swelling.  Gastrointestinal: Negative for abdominal pain.  Endocrine: Negative for polydipsia and polyuria.  Genitourinary: Negative for dysuria and hematuria.  Musculoskeletal: Negative for arthralgias.  Neurological: Negative for tremors, numbness and headaches.  Psychiatric/Behavioral: Positive for depression. Negative for dysphoric mood. The patient has insomnia.     Patient Active Problem List   Diagnosis Date Noted  . Controlled type 2 diabetes mellitus without complication, without long-term current use of insulin ([place]) 02/10/2017  . Dry mouth 10/10/2016  . Ovarian failure 10/10/2016  . Abnormal ECG 04/30/2015  . Allergic rhinitis, seasonal 04/30/2015  . Anxiety 04/30/2015  . Arthritis 04/30/2015  . Hepatic steatosis 04/30/2015  . Gastroesophageal reflux disease 04/30/2015  . Osteoarthritis of both hands 04/30/2015  . Blood in the urine 04/30/2015  . Genital herpes 04/04/2009  . Bone/cartilage disorder 08/04/2003  . Depression, major, single episode, in partial remission ([place]) 08/18/1998  . Essential hypertension 08/18/1998  . Hyperlipidemia 08/18/1998  . Insomnia 08/18/1998    Prior to Admission medications   Medication Sig Start Date End Date Taking? Authorizing Provider  acyclovir (ZOVIRAX) 200 MG capsule Take 1 capsule (200 mg total) by mouth 5 (five) times daily. 05/18/17  Yes Glean Hess, MD  atenolol (TENORMIN) 50 MG tablet Take 1 tablet (50 mg total) by mouth daily. 05/18/17  Yes Glean Hess, MD  atorvastatin (LIPITOR) 10 MG tablet Take 1 tablet (10 mg total) by mouth at bedtime. 05/18/17  Yes Glean Hess, MD  [place] 100 MG CAPS Take by mouth.   Yes [provider]  Blood Glucose Monitoring Suppl (FREESTYLE  FREEDOM LITE) w/Device KIT 1 each by Does not apply route daily. 10/16/16  Yes Glean Hess, MD  Coenzyme Q10 (CO Q 10) 100 MG CAPS Take 1 capsule by mouth daily.    Yes [provider]  Continuous Blood Gluc Receiver (FREESTYLE LIBRE READER) DEVI 1 each by Does not apply route daily. 10/22/16  Yes Glean Hess, MD  Continuous Blood Gluc Sensor ([place]) MISC 1 EACH BY DOES NOT APPLY ROUTE DAILY. ONE SENSOR EVERY 10 DAYS 06/06/17  Yes Glean Hess, MD  glucose blood (FREESTYLE LITE) test strip Use to test BS once daily 05/18/17  Yes Glean Hess, MD  hydrochlorothiazide (HYDRODIURIL) 12.5 MG tablet Take 1 tablet (12.5 mg total) by mouth daily. 05/18/17  Yes Glean Hess, MD  Lancets (FREESTYLE) lancets Use to test BS once daily 10/16/16  Yes Glean Hess, MD  Lutein 20 MG TABS Take 1 tablet by mouth daily.    Yes [provider]  metFORMIN (GLUCOPHAGE) 1000 MG tablet Take 1 tablet (1,000 mg total) by mouth 2 (two) times daily with a meal. 05/18/17  Yes Glean Hess, MD  milk thistle 175 MG tablet Take 175 mg by mouth daily.   Yes [provider]  MULTIPLE VITAMIN PO 1 tablet daily.  04/04/09  Yes [provider]  olopatadine (PATANOL) 0.1 % ophthalmic solution Apply to eye.   Yes [provider]  Omega-3 Fatty Acids (FISH OIL PEARLS PO) Take by mouth.   Yes [provider]  Omeprazole 20 MG TBEC Take 1 tablet (20 mg total) by mouth daily. 05/18/17  Yes Glean Hess, MD  PARoxetine (PAXIL) 20 MG tablet Take 1 tablet (20 mg total) by mouth daily. 05/18/17  Yes Glean Hess, MD  sitaGLIPtin (JANUVIA) 100 MG tablet Take 1 tablet (100 mg total) by mouth daily. 05/18/17  Yes Glean Hess, MD  TURMERIC PO Take 800 mg by mouth.   Yes [provider]  valsartan (DIOVAN) 80 MG tablet Take 1 tablet (80 mg total) by mouth daily. 05/18/17  Yes Glean Hess, MD  vitamin E (VITAMIN E) 400  UNIT capsule Take 1 capsule (400 Units total) by mouth daily. 09/05/15  Yes Margarita Rana, MD  Alpha-Lipoic Acid (NEOKE RA LIPOIC PO) Take 100 mg by mouth.    [provider]  Cholecalciferol (VITAMIN D HIGH POTENCY) 1000 UNITS capsule Take 1,000 Units by mouth daily.     [provider]  Chromium (CHROMEMATE PO) Take 500 mg by mouth.    [provider]  cyclobenzaprine (FLEXERIL) 5 MG tablet Take 1 tablet (5 mg total) by mouth 3 (three) times daily as needed for muscle spasms. Patient not taking: Reported on 11/18/2017 06/10/16   Glean Hess, MD  docusate sodium (COLACE) 100 MG capsule Take 100 mg by mouth 2 (two) times daily.    [provider]  Haydee Salter Leaf POWD by Does not apply route.    [provider]  Lecithin 1200 MG CAPS Take 1 capsule by mouth daily.     [provider]  loratadine (CLARITIN) 10 MG tablet Take 10 mg by mouth daily as needed.  11/21/13   [provider]  LUTEIN-ZEAXANTHIN PO Take 4 mg by mouth.    [provider]  vitamin C (ASCORBIC ACID) 500 MG tablet Take 500 mg by mouth daily.    [provider]    Allergies  Allergen Reactions  . Clonazepam Other (See Comments)    Nocturnal enuresis; Lethargy Nocturnal enuresis; Lethargy  . Meloxicam Other (See Comments)  . Trazodone Other (See Comments) and Anxiety    Dizziness Dizziness    Past Surgical History:  Procedure Laterality Date  . CYST EXCISION Left 2017   foot  . HAND SURGERY  07/2012    Social History   Tobacco Use  . Smoking status: Never Smoker  . Smokeless tobacco: Never Used  Substance Use Topics  . Alcohol use: No  . Drug use: No     Medication list has been reviewed and updated.  PHQ 2/9 Scores 11/18/2017 02/16/2017 02/10/2017 06/10/2016  PHQ - 2 Score 0 0 0 0    Physical Exam  Constitutional: She is oriented to person, place, and time. She appears well-developed. No distress.  HENT:  Head:  Normocephalic and atraumatic.  Neck: Normal range of motion. Neck supple. No thyromegaly present.  Cardiovascular: Normal rate and normal heart sounds.  Pulmonary/Chest: Effort normal and breath sounds normal. No respiratory distress. She has no wheezes.  Musculoskeletal: Normal range of motion. She exhibits no edema or tenderness.  Neurological: She is alert and oriented to person, place, and time.  Skin: Skin is warm and dry. No rash noted.  Psychiatric: She has a normal mood and affect. Her behavior is normal. Thought content normal.  Nursing note and vitals reviewed.   BP 124/80   Pulse 94   Ht '5\' 4"'  (1.626 m)   Wt 156 lb (70.8 kg)   SpO2 96%   BMI 26.78 kg/m   Assessment and Plan: 1. Controlled type 2 diabetes mellitus without complication, without long-term current use of insulin ([facility]) Controlled, continue current therapy  2. Depression, major, single episode, in partial remission ([place]) Doing well on medication  3. Primary insomnia Will try sonata - zaleplon (SONATA) 10 MG capsule; Take 1 capsule (10 mg total) by mouth at bedtime as needed for sleep.  Dispense: 30 capsule; Refill: 2  4. Hepatic steatosis Liver tests improved Continue exercise and low fat diet   Meds ordered this encounter  Medications  . zaleplon (SONATA) 10 MG capsule    Sig: Take 1 capsule (10 mg total) by mouth at bedtime as needed for sleep.    Dispense:  30 capsule    Refill:  2    Partially dictated using Editor, commissioning. Any errors are unintentional.  Halina Maidens, MD [facility]  11/18/2017

## 2017-11-20 ENCOUNTER — Telehealth: Payer: Self-pay

## 2017-11-20 NOTE — Telephone Encounter (Signed)
Patient called stating she was just prescribed medication for sleep at recent visit. Stated the meds that were sent she tried before and they did not help her. Wants to know if there is something else she can take and if so can we [facility].  Please Advise.

## 2017-11-21 ENCOUNTER — Other Ambulatory Visit: Payer: Self-pay | Admitting: Internal Medicine

## 2017-11-21 DIAGNOSIS — F5101 Primary insomnia: Secondary | ICD-10-CM

## 2017-11-23 ENCOUNTER — Other Ambulatory Visit: Payer: Self-pay | Admitting: Internal Medicine

## 2017-11-23 ENCOUNTER — Telehealth: Payer: Self-pay

## 2017-11-23 DIAGNOSIS — F5101 Primary insomnia: Secondary | ICD-10-CM

## 2017-11-23 MED ORDER — SUVOREXANT 10 MG PO TABS
10.0000 mg | ORAL_TABLET | Freq: Every day | ORAL | 1 refills | Status: DC
Start: 2017-11-23 — End: 2018-01-22

## 2017-11-23 NOTE — Telephone Encounter (Signed)
As far as I can tell, she has tried all the typical sleep aids other than Belsomra.   It is more expensive but in a completely different class of medication.

## 2017-11-23 NOTE — Telephone Encounter (Signed)
Patient informed and she would like to try Belsomra. [facility].

## 2017-11-23 NOTE — Telephone Encounter (Signed)
03/11/18 AWV rescheduled to 03/08/18. Program no longer available on Thurs.

## 2017-12-07 ENCOUNTER — Ambulatory Visit: Payer: Medicare Other | Admitting: Internal Medicine

## 2017-12-18 ENCOUNTER — Other Ambulatory Visit: Payer: Self-pay | Admitting: Internal Medicine

## 2018-01-08 ENCOUNTER — Telehealth: Payer: Self-pay

## 2018-01-08 ENCOUNTER — Other Ambulatory Visit: Payer: Self-pay | Admitting: Internal Medicine

## 2018-01-08 MED ORDER — FREESTYLE LIBRE 14 DAY READER DEVI: 1.0000 | 0 refills | Status: DC | PRN

## 2018-01-08 MED ORDER — FREESTYLE LIBRE 14 DAY SENSOR MISC: 1.0000 | 3 refills | Status: DC

## 2018-01-08 NOTE — Telephone Encounter (Signed)
LM she needs RX to say Free style Lebre Reader x14 days Supply  And  Free style Lebre Sensor x 14 days supply  [facility]

## 2018-01-22 ENCOUNTER — Telehealth: Payer: Self-pay

## 2018-01-22 ENCOUNTER — Other Ambulatory Visit: Payer: Self-pay | Admitting: Internal Medicine

## 2018-01-22 DIAGNOSIS — F5101 Primary insomnia: Secondary | ICD-10-CM

## 2018-01-22 MED ORDER — SUVOREXANT 10 MG PO TABS: 10.0000 mg | ORAL_TABLET | Freq: Every day | ORAL | 5 refills | Status: DC

## 2018-01-22 NOTE — Telephone Encounter (Signed)
Pt wants refill on Belsomra 10mg  to [facility]

## 2018-02-10 ENCOUNTER — Other Ambulatory Visit: Payer: Self-pay

## 2018-02-10 ENCOUNTER — Other Ambulatory Visit: Payer: Self-pay | Admitting: Internal Medicine

## 2018-02-10 DIAGNOSIS — I10 Essential (primary) hypertension: Secondary | ICD-10-CM

## 2018-02-10 MED ORDER — VALSARTAN 80 MG PO TABS: 80.0000 mg | ORAL_TABLET | Freq: Every day | ORAL | 3 refills | Status: DC

## 2018-02-10 NOTE — Telephone Encounter (Signed)
Rx request for Diovan 80 mg #90. Patient LOV 11/18/2017 Last refilled 05/18/2017 Next OV 03/16/2018

## 2018-03-08 ENCOUNTER — Ambulatory Visit: Payer: Self-pay

## 2018-03-08 ENCOUNTER — Telehealth: Payer: Self-pay | Admitting: Internal Medicine

## 2018-03-08 ENCOUNTER — Other Ambulatory Visit: Payer: Self-pay | Admitting: Internal Medicine

## 2018-03-08 ENCOUNTER — Ambulatory Visit (INDEPENDENT_AMBULATORY_CARE_PROVIDER_SITE_OTHER): Payer: Medicare Other

## 2018-03-08 ENCOUNTER — Other Ambulatory Visit: Payer: Self-pay

## 2018-03-08 VITALS — BP 138/72 | HR 71 | Temp 98.3°F | Resp 12 | Ht 64.0 in | Wt 154.8 lb

## 2018-03-08 DIAGNOSIS — Z0000 Encounter for general adult medical examination without abnormal findings: Secondary | ICD-10-CM | POA: Diagnosis not present

## 2018-03-08 DIAGNOSIS — F5101 Primary insomnia: Secondary | ICD-10-CM

## 2018-03-08 MED ORDER — ZALEPLON 5 MG PO CAPS: 5.0000 mg | ORAL_CAPSULE | Freq: Every evening | ORAL | 1 refills | Status: DC | PRN

## 2018-03-08 NOTE — Telephone Encounter (Signed)
Patient came into office today for MAW and requested refill on Sonata 5 mg. I spoke with her and she stated Belsomra was too expensive- even after insurance covered some of it. Would like to go back to [place] 5 mg and wants sent to [facility] order.  Please Advise.

## 2018-03-08 NOTE — Progress Notes (Signed)
Subjective:   Jenna Stein is a 67 y.o. female who presents for Medicare Annual (Subsequent) preventive examination.  Review of Systems:  N/A Cardiac Risk Factors include: advanced age (>66men, >53 women);diabetes mellitus;dyslipidemia;hypertension;sedentary lifestyle     Objective:     Vitals: BP 138/72 (BP Location: Right Arm, Patient Position: Sitting, Cuff Size: Normal)   Pulse 71   Temp 98.3 F (36.8 C) (Oral)   Resp 12   Ht 5\' 4"  (1.626 m)   Wt 154 lb 12.8 oz (70.2 kg)   SpO2 97%   BMI 26.57 kg/m   Body mass index is 26.57 kg/m.  Advanced Directives 03/08/2018 02/16/2017 06/10/2016 05/09/2015  Does Patient Have a Medical Advance Directive? Yes Yes Yes Yes  Type of Paramedic of [place];Living will [place];Living will [place];Living will Living will;Healthcare Power of [place] in Chart? No - copy requested No - copy requested - -    Tobacco Social History   Tobacco Use  Smoking Status Never Smoker  Smokeless Tobacco Never Used  Tobacco Comment   smoking cessation materials not required     Counseling given: No Comment: smoking cessation materials not required  Clinical Intake:  Pre-visit preparation completed: Yes  Pain : No/denies pain   BMI - recorded: 26.57 Nutritional Status: BMI 25 -29 Overweight Nutritional Risks: None  Nutrition Risk Assessment: Has the patient had any N/V/D within the last 2 months?  No Does the patient have any non-healing wounds?  No Has the patient had any unintentional weight loss or weight gain?  No  Is the patient diabetic?  Yes If diabetic, was a CBG obtained today?  No Did the patient bring in their glucometer from home?  No Comments: Pt monitors CBG's 3-4x's daily. Denies any financial strains with the device or supplies.  Diabetic Exams: Diabetic Eye Exam: Completed 04/07/17. Diabetic Foot Exam: Completed  11/18/17.   How often do you need to have someone help you when you read instructions, pamphlets, or other written materials from your doctor or pharmacy?: 1 - Never  Interpreter Needed?: No  Information entered by :: Idell Pickles, LPN  Past Medical History:  Diagnosis Date  . Allergy   . Anxiety   . Diabetes mellitus without complication ([place])   . GERD (gastroesophageal reflux disease)   . Hyperlipidemia   . Hypertension    Past Surgical History:  Procedure Laterality Date  . CYST EXCISION Left 2017   foot  . HAND SURGERY  07/2012   Family History  Problem Relation Age of Onset  . Hypertension Mother   . Hyperlipidemia Mother   . Arthritis Mother   . Parkinsonism Father   . Arthritis Other   . Hypertension Other   . Hyperlipidemia Other   . Hypothyroidism Other   . Breast cancer Sister 66   Social History   Socioeconomic History  . Marital status: Married    Spouse name: Not on file  . Number of children: 1  . Years of education: Not on file  . Highest education level: Bachelor's degree (e.g., BA, AB, BS)  Occupational History  . Occupation: Retired  Scientific laboratory technician  . Financial resource strain: Not hard at all  . Food insecurity:    Worry: Never true    Inability: Never true  . Transportation needs:    Medical: No    Non-medical: No  Tobacco Use  . Smoking status: Never Smoker  .  Smokeless tobacco: Never Used  . Tobacco comment: smoking cessation materials not required  Substance and Sexual Activity  . Alcohol use: No  . Drug use: No  . Sexual activity: Not Currently  Lifestyle  . Physical activity:    Days per week: 0 days    Minutes per session: 0 min  . Stress: Not at all  Relationships  . Social connections:    Talks on phone: Patient refused    Gets together: Patient refused    Attends religious service: Patient refused    Active member of club or organization: Patient refused    Attends meetings of clubs or organizations: Patient refused     Relationship status: Married  Other Topics Concern  . Not on file  Social History Narrative  . Not on file    Outpatient Encounter Medications as of 03/08/2018  Medication Sig  . acyclovir (ZOVIRAX) 200 MG capsule Take 1 capsule (200 mg total) by mouth 5 (five) times daily.  [name] atenolol (TENORMIN) 50 MG tablet Take 1 tablet (50 mg total) by mouth daily.  [name] atorvastatin (LIPITOR) 10 MG tablet Take 1 tablet (10 mg total) by mouth at bedtime.  . Bilberry 100 MG CAPS Take by mouth.  . Coenzyme Q10 (CO Q 10) 100 MG CAPS Take 1 capsule by mouth daily.   . Continuous Blood Gluc Receiver (FREESTYLE LIBRE 14 DAY READER) DEVI 1 each by Does not apply route every 4 (four) hours as needed.  . Continuous Blood Gluc Sensor (FREESTYLE LIBRE 14 DAY SENSOR) MISC 1 each by Does not apply route every 14 (fourteen) days.  [name] glucose blood (FREESTYLE LITE) test strip Use to test BS once daily  . hydrochlorothiazide (HYDRODIURIL) 12.5 MG tablet Take 1 tablet (12.5 mg total) by mouth daily.  . Lancets (FREESTYLE) lancets Use to test BS once daily  . Lutein 20 MG TABS Take 1 tablet by mouth daily.   . metFORMIN (GLUCOPHAGE) 1000 MG tablet Take 1 tablet (1,000 mg total) by mouth 2 (two) times daily with a meal.  . milk thistle 175 MG tablet Take 175 mg by mouth daily.  . MULTIPLE VITAMIN PO 1 tablet daily.   [name] olopatadine (PATANOL) 0.1 % ophthalmic solution Apply to eye.  . Omega-3 Fatty Acids (FISH OIL PEARLS PO) Take by mouth.  . Omeprazole 20 MG TBEC Take 1 tablet (20 mg total) by mouth daily.  [name] PARoxetine (PAXIL) 20 MG tablet Take 1 tablet (20 mg total) by mouth daily.  . sitaGLIPtin (JANUVIA) 100 MG tablet Take 1 tablet (100 mg total) by mouth daily.  . TURMERIC PO Take 800 mg by mouth.  . valsartan (DIOVAN) 80 MG tablet Take 1 tablet (80 mg total) by mouth daily.  . vitamin E (VITAMIN E) 400 UNIT capsule Take 1 capsule (400 Units total) by mouth daily.  . Suvorexant (BELSOMRA) 10 MG TABS Take 10 mg by  mouth at bedtime.   No facility-administered encounter medications on file as of 03/08/2018.     Activities of Daily Living In your present state of health, do you have any difficulty performing the following activities: 03/08/2018  Hearing? N  Comment denies hearing aids  Vision? N  Comment wears eyeglasses  Difficulty concentrating or making decisions? N  Walking or climbing stairs? N  Dressing or bathing? N  Doing errands, shopping? N  Preparing Food and eating ? N  Comment denies dentures  Using the Toilet? N  In the past six months, have you accidently  leaked urine? N  Do you have problems with loss of bowel control? N  Managing your Medications? N  Managing your Finances? N  Housekeeping or managing your Housekeeping? N  Some recent data might be hidden    Patient Care Team: Glean Hess, MD as PCP - General (Family Medicine) [facility] (Ophthalmology)    Assessment:   This is a routine wellness examination for Herbert Seta.  Exercise Activities and Dietary recommendations Current Exercise Habits: The patient does not participate in regular exercise at present, Exercise limited by: None identified  Goals    . DIET - INCREASE WATER INTAKE     Recommend to drink at least 6-8 8oz glasses of water per day.       Fall Risk Fall Risk  03/08/2018 02/16/2017 02/10/2017 12/26/2015  Falls in the past year? No No No No  Risk for fall due to : Impaired vision;Medication side effect - - -  Risk for fall due to: Comment wears eyeglasses - - -   FALL RISK PREVENTION PERTAINING TO HOME: Is your home free of loose throw rugs in walkways, pet beds, electrical cords, etc? Yes Is there adequate lighting in your home to reduce risk of falls?  Yes Are there stairs in or around your home WITH handrails? Yes  ASSISTIVE DEVICES UTILIZED TO PREVENT FALLS: Use of a cane, walker or w/c? No Grab bars in the bathroom? No  Shower chair or a place to sit while bathing? No An elevated  toilet seat or a handicapped toilet? No  Timed Get Up and Go Performed: Yes. Pt ambulated 10 feet within 7 sec. Gait stead-fast and without the use of an assistive device. No intervention required at this time. Fall risk prevention has been discussed.  Community Resource Referral:  Pt declined my offer to send [employer] Referral to Care Guide for installation of grab bars in the shower, shower chair or an elevated toilet seat.  Depression Screen PHQ 2/9 Scores 03/08/2018 11/18/2017 02/16/2017 02/10/2017  PHQ - 2 Score 0 0 0 0  PHQ- 9 Score 0 - - -     Cognitive Function     6CIT Screen 03/08/2018 02/16/2017  What Year? 0 points 0 points  What month? 0 points 0 points  What time? 0 points 0 points  Count back from 20 0 points 0 points  Months in reverse 2 points 0 points  Repeat phrase 0 points 0 points  Total Score 2 0    Immunization History  Administered Date(s) Administered  . Influenza Split 04/18/2011, 05/24/2012  . Influenza, High Dose Seasonal PF 07/03/2017  . Influenza,inj,Quad PF,6+ Mos 05/02/2013, 05/03/2014, 05/09/2015  . Influenza,inj,quad, With Preservative 07/18/2016  . Influenza-Unspecified 05/01/2016, 04/18/2017  . Pneumococcal Conjugate-13 10/10/2016  . Pneumococcal Polysaccharide-23 08/07/2017  . Td 08/04/2003  . Tdap 08/04/2003, 04/18/2011  . Zoster 01/12/2013    Qualifies for Shingles Vaccine? Yes. Zostavax completed 01/12/13. Due for Shingrix. Education has been provided regarding the importance of this vaccine. Pt has been advised to call insurance company to determine out of pocket expense. Advised may also receive vaccine at local pharmacy or Health Dept. Verbalized acceptance and understanding.  Screening Tests Health Maintenance  Topic Date Due  . INFLUENZA VACCINE  03/18/2018  . OPHTHALMOLOGY EXAM  04/07/2018  . HEMOGLOBIN A1C  05/15/2018  . MAMMOGRAM  09/07/2018  . FOOT EXAM  11/19/2018  . TETANUS/TDAP  04/17/2021  . COLONOSCOPY   07/22/2021  . DEXA SCAN  Completed  .  Hepatitis C Screening  Completed  . PNA vac Low Risk Adult  Completed    Cancer Screenings: Lung: Low Dose CT Chest recommended if Age 22-80 years, 30 pack-year currently smoking OR have quit w/in 15years. Patient does not qualify. Breast:  Up to date on Mammogram? Yes. Completed 09/07/17. Repeat every year   Up to date of Bone Density/Dexa? Yes. Completed 11/03/16. Results reflect osteopenia. Repeat every 2 years Colorectal: Completed 07/23/11. Repeat every 10 years  Additional Screenings: Hepatitis C Screening: Completed 12/26/15    Plan:  I have personally reviewed and addressed the Medicare Annual Wellness questionnaire and have noted the following in the patient's chart:  A. Medical and social history B. Use of alcohol, tobacco or illicit drugs  C. Current medications and supplements D. Functional ability and status E.  Nutritional status F.  Physical activity G. Advance directives H. List of other physicians I.  Hospitalizations, surgeries, and ER visits in previous 12 months J.  [place] such as hearing and vision if needed, cognitive and depression L. Referrals and appointments  In addition, I have reviewed and discussed with patient certain preventive protocols, quality metrics, and best practice recommendations. A written personalized care plan for preventive services as well as general preventive health recommendations were provided to patient.  Signed,  Aleatha Borer, LPN Nurse Health Advisor  MD Recommendations: Zostavax completed 01/12/13. Due for Shingrix. Education has been provided regarding the importance of this vaccine. Pt has been advised to call insurance company to determine out of pocket expense. Advised may also receive vaccine at local pharmacy or Health Dept. Verbalized acceptance and understanding.

## 2018-03-08 NOTE — Telephone Encounter (Signed)
Pt needs refill (Zaleplon 5 mg) sent to pharmacy

## 2018-03-08 NOTE — Patient Instructions (Signed)
Jenna Stein , Thank you for taking time to come for your Medicare Wellness Visit. I appreciate your ongoing commitment to your health goals. Please review the following plan we discussed and let me know if I can assist you in the future.   Screening recommendations/referrals: Colorectal Screening: Up to date Mammogram: Up to date Bone Density: Up to date  Vision and Dental Exams: Recommended annual ophthalmology exams for early detection of glaucoma and other disorders of the eye Recommended annual dental exams for proper oral hygiene  Diabetic Exams: Recommended annual diabetic eye exams for early detection of retinopathy Recommended annual diabetic foot exams for early detection of peripheral neuropathy.  Diabetic Eye Exam: Up to date Diabetic Foot Exam: Up to date  Vaccinations: Influenza vaccine: Up to date Pneumococcal vaccine: Up to date Tdap vaccine: Up to date Shingles vaccine: Please call your insurance company to determine your out of pocket expense for the Shingrix vaccine. You may also receive this vaccine at your local pharmacy or Health Dept.    Advanced directives: Please bring a copy of your POA (Power of Attorney) and/or Living Will to your next appointment.  Goals: Recommend to drink at least 6-8 8oz glasses of water per day.  Next appointment: Please schedule your Annual Wellness Visit with your Nurse Health Advisor in one year.  Preventive Care 68 Years and Older, Female Preventive care refers to lifestyle choices and visits with your health care provider that can promote health and wellness. What does preventive care include?  A yearly physical exam. This is also called an annual well check.  Dental exams once or twice a year.  Routine eye exams. Ask your health care provider how often you should have your eyes checked.  Personal lifestyle choices, including:  Daily care of your teeth and gums.  Regular physical activity.  Eating a healthy  diet.  Avoiding tobacco and drug use.  Limiting alcohol use.  Practicing safe sex.  Taking low-dose aspirin every day.  Taking vitamin and mineral supplements as recommended by your health care provider. What happens during an annual well check? The services and screenings done by your health care provider during your annual well check will depend on your age, overall health, lifestyle risk factors, and family history of disease. Counseling  Your health care provider may ask you questions about your:  Alcohol use.  Tobacco use.  Drug use.  Emotional well-being.  Home and relationship well-being.  Sexual activity.  Eating habits.  History of falls.  Memory and ability to understand (cognition).  Work and work Statistician.  Reproductive health. Screening  You may have the following tests or measurements:  Height, weight, and BMI.  Blood pressure.  Lipid and cholesterol levels. These may be checked every 5 years, or more frequently if you are over 36 years old.  Skin check.  Lung cancer screening. You may have this screening every year starting at age 10 if you have a 30-pack-year history of smoking and currently smoke or have quit within the past 15 years.  Fecal occult blood test (FOBT) of the stool. You may have this test every year starting at age 55.  Flexible sigmoidoscopy or colonoscopy. You may have a sigmoidoscopy every 5 years or a colonoscopy every 10 years starting at age 48.  Hepatitis C blood test.  Hepatitis B blood test.  Sexually transmitted disease (STD) testing.  Diabetes screening. This is done by checking your blood sugar (glucose) after you have not eaten for a while (  fasting). You may have this done every 1-3 years.  Bone density scan. This is done to screen for osteoporosis. You may have this done starting at age 13.  Mammogram. This may be done every 1-2 years. Talk to your health care provider about how often you should have  regular mammograms. Talk with your health care provider about your test results, treatment options, and if necessary, the need for more tests. Vaccines  Your health care provider may recommend certain vaccines, such as:  Influenza vaccine. This is recommended every year.  Tetanus, diphtheria, and acellular pertussis (Tdap, Td) vaccine. You may need a Td booster every 10 years.  Zoster vaccine. You may need this after age 91.  Pneumococcal 13-valent conjugate (PCV13) vaccine. One dose is recommended after age 59.  Pneumococcal polysaccharide (PPSV23) vaccine. One dose is recommended after age 78. Talk to your health care provider about which screenings and vaccines you need and how often you need them. This information is not intended to replace advice given to you by your health care provider. Make sure you discuss any questions you have with your health care provider. Document Released: 08/31/2015 Document Revised: 04/23/2016 Document Reviewed: 06/05/2015 Elsevier Interactive Patient Education  2017 [place] Prevention in the Home Falls can cause injuries. They can happen to people of all ages. There are many things you can do to make your home safe and to help prevent falls. What can I do on the outside of my home?  Regularly fix the edges of walkways and driveways and fix any cracks.  Remove anything that might make you trip as you walk through a door, such as a raised step or threshold.  Trim any bushes or trees on the path to your home.  Use bright outdoor lighting.  Clear any walking paths of anything that might make someone trip, such as rocks or tools.  Regularly check to see if handrails are loose or broken. Make sure that both sides of any steps have handrails.  Any raised decks and porches should have guardrails on the edges.  Have any leaves, snow, or ice cleared regularly.  Use sand or salt on walking paths during winter.  Clean up any spills in your  garage right away. This includes oil or grease spills. What can I do in the bathroom?  Use night lights.  Install grab bars by the toilet and in the tub and shower. Do not use towel bars as grab bars.  Use non-skid mats or decals in the tub or shower.  If you need to sit down in the shower, use a plastic, non-slip stool.  Keep the floor dry. Clean up any water that spills on the floor as soon as it happens.  Remove soap buildup in the tub or shower regularly.  Attach bath mats securely with double-sided non-slip rug tape.  Do not have throw rugs and other things on the floor that can make you trip. What can I do in the bedroom?  Use night lights.  Make sure that you have a light by your bed that is easy to reach.  Do not use any sheets or blankets that are too big for your bed. They should not hang down onto the floor.  Have a firm chair that has side arms. You can use this for support while you get dressed.  Do not have throw rugs and other things on the floor that can make you trip. What can I do in the kitchen?  Clean up any spills right away.  Avoid walking on wet floors.  Keep items that you use a lot in easy-to-reach places.  If you need to reach something above you, use a strong step stool that has a grab bar.  Keep electrical cords out of the way.  Do not use floor polish or wax that makes floors slippery. If you must use wax, use non-skid floor wax.  Do not have throw rugs and other things on the floor that can make you trip. What can I do with my stairs?  Do not leave any items on the stairs.  Make sure that there are handrails on both sides of the stairs and use them. Fix handrails that are broken or loose. Make sure that handrails are as long as the stairways.  Check any carpeting to make sure that it is firmly attached to the stairs. Fix any carpet that is loose or worn.  Avoid having throw rugs at the top or bottom of the stairs. If you do have throw  rugs, attach them to the floor with carpet tape.  Make sure that you have a light switch at the top of the stairs and the bottom of the stairs. If you do not have them, ask someone to add them for you. What else can I do to help prevent falls?  Wear shoes that:  Do not have high heels.  Have rubber bottoms.  Are comfortable and fit you well.  Are closed at the toe. Do not wear sandals.  If you use a stepladder:  Make sure that it is fully opened. Do not climb a closed stepladder.  Make sure that both sides of the stepladder are locked into place.  Ask someone to hold it for you, if possible.  Clearly mark and make sure that you can see:  Any grab bars or handrails.  First and last steps.  Where the edge of each step is.  Use tools that help you move around (mobility aids) if they are needed. These include:  Canes.  Walkers.  Scooters.  Crutches.  Turn on the lights when you go into a dark area. Replace any light bulbs as soon as they burn out.  Set up your furniture so you have a clear path. Avoid moving your furniture around.  If any of your floors are uneven, fix them.  If there are any pets around you, be aware of where they are.  Review your medicines with your doctor. Some medicines can make you feel dizzy. This can increase your chance of falling. Ask your doctor what other things that you can do to help prevent falls. This information is not intended to replace advice given to you by your health care provider. Make sure you discuss any questions you have with your health care provider. Document Released: 05/31/2009 Document Revised: 01/10/2016 Document Reviewed: 09/08/2014 Elsevier Interactive Patient Education  2017 [employer].

## 2018-03-11 ENCOUNTER — Ambulatory Visit: Payer: Medicare Other

## 2018-03-16 ENCOUNTER — Ambulatory Visit: Payer: Medicare Other | Admitting: Internal Medicine

## 2018-03-26 DIAGNOSIS — K136 Irritative hyperplasia of oral mucosa: Secondary | ICD-10-CM | POA: Diagnosis not present

## 2018-04-08 DIAGNOSIS — E119 Type 2 diabetes mellitus without complications: Secondary | ICD-10-CM | POA: Diagnosis not present

## 2018-04-08 LAB — HM DIABETES EYE EXAM

## 2018-04-09 ENCOUNTER — Encounter: Payer: Self-pay | Admitting: Internal Medicine

## 2018-04-29 DIAGNOSIS — Z23 Encounter for immunization: Secondary | ICD-10-CM | POA: Diagnosis not present

## 2018-05-20 ENCOUNTER — Other Ambulatory Visit: Payer: Self-pay

## 2018-05-20 ENCOUNTER — Telehealth: Payer: Self-pay

## 2018-05-20 DIAGNOSIS — A6000 Herpesviral infection of urogenital system, unspecified: Secondary | ICD-10-CM

## 2018-05-20 DIAGNOSIS — E782 Mixed hyperlipidemia: Secondary | ICD-10-CM

## 2018-05-20 DIAGNOSIS — I10 Essential (primary) hypertension: Secondary | ICD-10-CM

## 2018-05-20 DIAGNOSIS — E119 Type 2 diabetes mellitus without complications: Secondary | ICD-10-CM

## 2018-05-20 DIAGNOSIS — F419 Anxiety disorder, unspecified: Secondary | ICD-10-CM

## 2018-05-20 DIAGNOSIS — K219 Gastro-esophageal reflux disease without esophagitis: Secondary | ICD-10-CM

## 2018-05-20 MED ORDER — OMEPRAZOLE 20 MG PO TBEC: 20.0000 mg | DELAYED_RELEASE_TABLET | Freq: Every day | ORAL | 1 refills | Status: DC

## 2018-05-20 MED ORDER — PAROXETINE HCL 20 MG PO TABS: 20.0000 mg | ORAL_TABLET | Freq: Every day | ORAL | 1 refills | Status: DC

## 2018-05-20 MED ORDER — ACYCLOVIR 200 MG PO CAPS: 200.0000 mg | ORAL_CAPSULE | Freq: Every day | ORAL | 1 refills | Status: DC

## 2018-05-20 MED ORDER — VALSARTAN 80 MG PO TABS: 80.0000 mg | ORAL_TABLET | Freq: Every day | ORAL | 1 refills | Status: DC

## 2018-05-20 MED ORDER — HYDROCHLOROTHIAZIDE 12.5 MG PO TABS: 12.5000 mg | ORAL_TABLET | Freq: Every day | ORAL | 1 refills | Status: DC

## 2018-05-20 MED ORDER — ATORVASTATIN CALCIUM 10 MG PO TABS: 10.0000 mg | ORAL_TABLET | Freq: Every day | ORAL | 1 refills | Status: DC

## 2018-05-20 MED ORDER — METFORMIN HCL 1000 MG PO TABS: 1000.0000 mg | ORAL_TABLET | Freq: Two times a day (BID) | ORAL | 1 refills | Status: DC

## 2018-05-20 MED ORDER — SITAGLIPTIN PHOSPHATE 100 MG PO TABS: 100.0000 mg | ORAL_TABLET | Freq: Every day | ORAL | 1 refills | Status: DC

## 2018-05-20 MED ORDER — ATENOLOL 50 MG PO TABS: 50.0000 mg | ORAL_TABLET | Freq: Every day | ORAL | 1 refills | Status: DC

## 2018-05-20 NOTE — Telephone Encounter (Signed)
Patient called stating she needs her medications refilled and sent to [facility]. Sent in medications.

## 2018-07-28 ENCOUNTER — Ambulatory Visit (INDEPENDENT_AMBULATORY_CARE_PROVIDER_SITE_OTHER): Payer: Medicare Other

## 2018-07-28 ENCOUNTER — Ambulatory Visit (INDEPENDENT_AMBULATORY_CARE_PROVIDER_SITE_OTHER): Payer: Medicare Other | Admitting: Podiatry

## 2018-07-28 ENCOUNTER — Other Ambulatory Visit: Payer: Self-pay | Admitting: Podiatry

## 2018-07-28 ENCOUNTER — Encounter: Payer: Self-pay | Admitting: Podiatry

## 2018-07-28 VITALS — BP 165/81 | HR 74 | Resp 16

## 2018-07-28 DIAGNOSIS — M7750 Other enthesopathy of unspecified foot: Secondary | ICD-10-CM

## 2018-07-28 DIAGNOSIS — M7662 Achilles tendinitis, left leg: Secondary | ICD-10-CM

## 2018-07-28 MED ORDER — CELECOXIB 200 MG PO CAPS: 200.0000 mg | ORAL_CAPSULE | Freq: Every day | ORAL | 3 refills | Status: DC

## 2018-07-28 NOTE — Patient Instructions (Signed)

## 2018-07-28 NOTE — Progress Notes (Signed)
Subjective:  Patient ID: Jenna Stein, female    DOB: 1951-04-26,  MRN: [id number] HPI Chief Complaint  Patient presents with  . Foot Pain    Posterior heel left - aching since April 2019, stepped off a step that was further than she thought and felt a pull, tried KT taping-no help  . New Patient (Initial Visit)    67 y.o. female presents with the above complaint.   ROS: Denies fever chills nausea vomiting muscle aches pains calf pain back pain chest pain shortness of breath.  Past Medical History:  Diagnosis Date  . Allergy   . Anxiety   . Diabetes mellitus without complication ([place])   . GERD (gastroesophageal reflux disease)   . Hyperlipidemia   . Hypertension    Past Surgical History:  Procedure Laterality Date  . CYST EXCISION Left 2017   foot  . HAND SURGERY  07/2012    Current Outpatient Medications:  .  acyclovir (ZOVIRAX) 200 MG capsule, Take 1 capsule (200 mg total) by mouth 5 (five) times daily., Disp: 90 capsule, Rfl: 1 .  atenolol (TENORMIN) 50 MG tablet, Take 1 tablet (50 mg total) by mouth daily., Disp: 90 tablet, Rfl: 1 .  atorvastatin (LIPITOR) 10 MG tablet, Take 1 tablet (10 mg total) by mouth at bedtime., Disp: 90 tablet, Rfl: 1 .  Bilberry 100 MG CAPS, Take by mouth., Disp: , Rfl:  .  celecoxib (CELEBREX) 200 MG capsule, Take 1 capsule (200 mg total) by mouth daily., Disp: 30 capsule, Rfl: 3 .  Coenzyme Q10 (CO Q 10) 100 MG CAPS, Take 1 capsule by mouth daily. , Disp: , Rfl:  .  Continuous Blood Gluc Receiver (FREESTYLE LIBRE 14 DAY READER) DEVI, 1 each by Does not apply route every 4 (four) hours as needed., Disp: 1 Device, Rfl: 0 .  Continuous Blood Gluc Sensor (FREESTYLE LIBRE 14 DAY SENSOR) MISC, 1 each by Does not apply route every 14 (fourteen) days., Disp: 8 each, Rfl: 3 .  glucose blood (FREESTYLE LITE) test strip, Use to test BS once daily, Disp: 100 each, Rfl: 12 .  hydrochlorothiazide (HYDRODIURIL) 12.5 MG tablet, Take 1 tablet (12.5 mg total)  by mouth daily., Disp: 90 tablet, Rfl: 1 .  Lancets (FREESTYLE) lancets, Use to test BS once daily, Disp: 100 each, Rfl: 12 .  Lutein 20 MG TABS, Take 1 tablet by mouth daily. , Disp: , Rfl:  .  metFORMIN (GLUCOPHAGE) 1000 MG tablet, Take 1 tablet (1,000 mg total) by mouth 2 (two) times daily with a meal., Disp: 180 tablet, Rfl: 1 .  milk thistle 175 MG tablet, Take 175 mg by mouth daily., Disp: , Rfl:  .  MULTIPLE VITAMIN PO, 1 tablet daily. , Disp: , Rfl:  .  olopatadine (PATANOL) 0.1 % ophthalmic solution, Apply to eye., Disp: , Rfl:  .  Omega-3 Fatty Acids (FISH OIL PEARLS PO), Take by mouth., Disp: , Rfl:  .  Omeprazole 20 MG TBEC, Take 1 tablet (20 mg total) by mouth daily., Disp: 90 each, Rfl: 1 .  PARoxetine (PAXIL) 20 MG tablet, Take 1 tablet (20 mg total) by mouth daily., Disp: 90 tablet, Rfl: 1 .  sitaGLIPtin (JANUVIA) 100 MG tablet, Take 1 tablet (100 mg total) by mouth daily., Disp: 90 tablet, Rfl: 1 .  TURMERIC PO, Take 800 mg by mouth., Disp: , Rfl:  .  valsartan (DIOVAN) 80 MG tablet, Take 1 tablet (80 mg total) by mouth daily., Disp: 90 tablet, Rfl: 1 .  vitamin E (VITAMIN E) 400 UNIT capsule, Take 1 capsule (400 Units total) by mouth daily., Disp: 1 capsule, Rfl: 0 .  zaleplon (SONATA) 5 MG capsule, Take 1 capsule (5 mg total) by mouth at bedtime as needed for sleep., Disp: 90 capsule, Rfl: 1  Allergies  Allergen Reactions  . Meloxicam Other (See Comments)  . Clonazepam Other (See Comments)    Nocturnal enuresis; Lethargy   . Trazodone Anxiety and Other (See Comments)    Dizziness    Review of Systems Objective:   Vitals:   07/28/18 1328  BP: (!) 165/81  Pulse: 74  Resp: 16    General: Well developed, nourished, in no acute distress, alert and oriented x3   Dermatological: Skin is warm, dry and supple bilateral. Nails x 10 are well maintained; remaining integument appears unremarkable at this time. There are no open sores, no preulcerative lesions, no rash or  signs of infection present.  Vascular: Dorsalis Pedis artery and Posterior Tibial artery pedal pulses are 2/4 bilateral with immedate capillary fill time. Pedal hair growth present. No varicosities and no lower extremity edema present bilateral.   Neruologic: Grossly intact via light touch bilateral. Vibratory intact via tuning fork bilateral. Protective threshold with Semmes Wienstein monofilament intact to all pedal sites bilateral. Patellar and Achilles deep tendon reflexes 2+ bilateral. No Babinski or clonus noted bilateral.   Musculoskeletal: No gross boney pedal deformities bilateral. No pain, crepitus, or limitation noted with foot and ankle range of motion bilateral. Muscular strength 5/5 in all groups tested bilateral.  She has pain on palpation of the Achilles at its insertion site.  No pain on medial lateral compression of the calcaneus mild tenderness on palpation of the plantar fascia.  Gait: Unassisted, Nonantalgic.    Radiographs:  Radiographs taken today demonstrate a large retrocalcaneal heel spur with soft tissue thickening of the Achilles as it inserts on spur.  No acute findings.  Assessment & Plan:   Assessment: Insertional Achilles tendinitis left.  Plan: Discussed etiology pathology and surgical therapies at this point time I injected small amount of dexamethasone and local anesthetic to the subcutaneous area of the left heel placed her in a cam walker start her on a Medrol Dosepak to be followed by Celebrex.  She is also despondent dispensed night splint     Max T. Cody, [place]

## 2018-08-13 ENCOUNTER — Encounter: Payer: Self-pay | Admitting: Internal Medicine

## 2018-08-13 ENCOUNTER — Ambulatory Visit (INDEPENDENT_AMBULATORY_CARE_PROVIDER_SITE_OTHER): Payer: Medicare Other | Admitting: Internal Medicine

## 2018-08-13 VITALS — BP 132/78 | HR 72 | Ht 64.0 in | Wt 155.0 lb

## 2018-08-13 DIAGNOSIS — Z1231 Encounter for screening mammogram for malignant neoplasm of breast: Secondary | ICD-10-CM | POA: Diagnosis not present

## 2018-08-13 DIAGNOSIS — E119 Type 2 diabetes mellitus without complications: Secondary | ICD-10-CM

## 2018-08-13 DIAGNOSIS — I10 Essential (primary) hypertension: Secondary | ICD-10-CM

## 2018-08-13 DIAGNOSIS — F324 Major depressive disorder, single episode, in partial remission: Secondary | ICD-10-CM

## 2018-08-13 DIAGNOSIS — E1169 Type 2 diabetes mellitus with other specified complication: Secondary | ICD-10-CM | POA: Diagnosis not present

## 2018-08-13 DIAGNOSIS — K219 Gastro-esophageal reflux disease without esophagitis: Secondary | ICD-10-CM

## 2018-08-13 DIAGNOSIS — F5101 Primary insomnia: Secondary | ICD-10-CM | POA: Diagnosis not present

## 2018-08-13 DIAGNOSIS — E785 Hyperlipidemia, unspecified: Secondary | ICD-10-CM

## 2018-08-13 DIAGNOSIS — K5901 Slow transit constipation: Secondary | ICD-10-CM | POA: Insufficient documentation

## 2018-08-13 DIAGNOSIS — K5900 Constipation, unspecified: Secondary | ICD-10-CM | POA: Insufficient documentation

## 2018-08-13 LAB — POCT URINALYSIS DIPSTICK
Bilirubin, UA: NEGATIVE
Blood, UA: NEGATIVE
GLUCOSE UA: NEGATIVE
Ketones, UA: NEGATIVE
LEUKOCYTES UA: NEGATIVE
NITRITE UA: NEGATIVE
Protein, UA: NEGATIVE
Spec Grav, UA: 1.01 (ref 1.010–1.025)
Urobilinogen, UA: 0.2 E.U./dL
pH, UA: 7 (ref 5.0–8.0)

## 2018-08-13 MED ORDER — FREESTYLE LIBRE 14 DAY SENSOR MISC: 1.0000 | 3 refills | Status: DC

## 2018-08-13 MED ORDER — GLUCOSE BLOOD VI STRP: ORAL_STRIP | 3 refills | Status: DC

## 2018-08-13 MED ORDER — LINACLOTIDE 145 MCG PO CAPS: 145.0000 ug | ORAL_CAPSULE | Freq: Every day | ORAL | 1 refills | Status: DC

## 2018-08-13 MED ORDER — ZALEPLON 10 MG PO CAPS: 10.0000 mg | ORAL_CAPSULE | Freq: Every evening | ORAL | 1 refills | Status: DC | PRN

## 2018-08-13 NOTE — Patient Instructions (Signed)
Health Maintenance  Topic Date Due  . HEMOGLOBIN A1C  05/15/2018  . MAMMOGRAM  09/07/2018  . FOOT EXAM  11/19/2018  . OPHTHALMOLOGY EXAM  04/09/2019  . TETANUS/TDAP  04/17/2021  . COLONOSCOPY  07/22/2021  . INFLUENZA VACCINE  Completed  . DEXA SCAN  Completed  . Hepatitis C Screening  Completed  . PNA vac Low Risk Adult  Completed

## 2018-08-13 NOTE — Progress Notes (Signed)
Date:  08/13/2018   Name:  Jenna Stein   DOB:  1951-03-28   MRN:  [id number]   Chief Complaint: Diabetes (Need medication refills. ); Hyperlipidemia; and Insomnia (Wants to know if she can up dose on Sonata to help her sleep because 5 mg is not strong enough at times. )  Diabetes  She presents for her follow-up diabetic visit. She has type 2 diabetes mellitus. Her disease course has been stable. Pertinent negatives for hypoglycemia include no dizziness, headaches, nervousness/anxiousness or tremors. Pertinent negatives for diabetes include no chest pain, no fatigue, no polydipsia and no polyuria. There are no diabetic complications. Current diabetic treatment includes oral agent (dual therapy). She is compliant with treatment all of the time. Her weight is stable. She monitors blood glucose at home 3-4 x per day. There is no change in her home blood glucose trend. Her breakfast blood glucose is taken between 6-7 am. Her breakfast blood glucose range is generally 90-110 mg/dl. An ACE inhibitor/angiotensin II receptor blocker is being taken. Eye exam is current.  Hyperlipidemia  The problem is controlled. There are no known factors aggravating her hyperlipidemia. Pertinent negatives include no chest pain or shortness of breath. Current antihyperlipidemic treatment includes statins. The current treatment provides significant improvement of lipids.  Depression         This is a chronic problem.  The problem has been resolved since onset.  Associated symptoms include insomnia.  Associated symptoms include no fatigue and no headaches.  Past treatments include SSRIs - Selective serotonin reuptake inhibitors. Gastroesophageal Reflux  She complains of heartburn. She reports no abdominal pain, no chest pain, no coughing or no wheezing. This is a recurrent problem. The problem occurs rarely. The problem has been unchanged. Pertinent negatives include no fatigue. She has tried a PPI for the symptoms.    Hypertension  This is a chronic problem. The problem is controlled. Pertinent negatives include no chest pain, headaches, palpitations or shortness of breath. Past treatments include diuretics, angiotensin blockers and beta blockers. The current treatment provides significant improvement.  Insomnia  Primary symptoms: sleep disturbance.  The problem occurs nightly. The problem is unchanged. PMH includes: depression.  Constipation  This is a chronic problem. The current episode started more than 1 year ago. The problem is unchanged. Her stool frequency is 1 time per week or less (if she does not take something). The stool is described as pellet like and scybalous. The patient is on a high fiber diet. She exercises regularly. There has been adequate water intake. Pertinent negatives include no abdominal pain, diarrhea, fever or vomiting. She has tried fiber, laxatives and stool softeners for the symptoms. The treatment provided mild relief.   Lab Results  Component Value Date   HGBA1C 6.0 (H) 11/12/2017   Lab Results  Component Value Date   CREATININE 0.94 11/12/2017   BUN 15 11/12/2017   NA 142 11/12/2017   K 4.3 11/12/2017   CL 103 11/12/2017   CO2 24 11/12/2017     Review of Systems  Constitutional: Negative for chills, fatigue and fever.  HENT: Negative for congestion, hearing loss, tinnitus, trouble swallowing and voice change.   Eyes: Negative for visual disturbance.  Respiratory: Negative for cough, chest tightness, shortness of breath and wheezing.   Cardiovascular: Negative for chest pain, palpitations and leg swelling.  Gastrointestinal: Positive for constipation and heartburn. Negative for abdominal pain, diarrhea and vomiting.  Endocrine: Negative for polydipsia and polyuria.  Genitourinary: Negative for dysuria,  frequency, genital sores, vaginal bleeding and vaginal discharge.  Musculoskeletal: Positive for arthralgias (achilles tendonitis). Negative for gait problem and  joint swelling.  Skin: Negative for color change and rash.  Neurological: Negative for dizziness, tremors, light-headedness and headaches.  Hematological: Negative for adenopathy. Does not bruise/bleed easily.  Psychiatric/Behavioral: Positive for depression and sleep disturbance. Negative for dysphoric mood. The patient has insomnia. The patient is not nervous/anxious.     Patient Active Problem List   Diagnosis Date Noted  . Gastroesophageal reflux disease 08/13/2018  . Controlled type 2 diabetes mellitus without complication, without long-term current use of insulin ([place]) 02/10/2017  . Dry mouth 10/10/2016  . Ovarian failure 10/10/2016  . Abnormal ECG 04/30/2015  . Allergic rhinitis, seasonal 04/30/2015  . Anxiety 04/30/2015  . Arthritis 04/30/2015  . Hepatic steatosis 04/30/2015  . Osteoarthritis of both hands 04/30/2015  . Blood in the urine 04/30/2015  . Genital herpes 04/04/2009  . Bone/cartilage disorder 08/04/2003  . Depression, major, single episode, in partial remission ([place]) 08/18/1998  . Essential hypertension 08/18/1998  . Hyperlipidemia associated with type 2 diabetes mellitus ([place]) 08/18/1998  . Insomnia 08/18/1998    Allergies  Allergen Reactions  . Meloxicam Other (See Comments)  . Clonazepam Other (See Comments)    Nocturnal enuresis; Lethargy   . Trazodone Anxiety and Other (See Comments)    Dizziness     Past Surgical History:  Procedure Laterality Date  . CYST EXCISION Left 2017   foot  . HAND SURGERY  07/2012    Social History   Tobacco Use  . Smoking status: Never Smoker  . Smokeless tobacco: Never Used  . Tobacco comment: smoking cessation materials not required  Substance Use Topics  . Alcohol use: No  . Drug use: No     Medication list has been reviewed and updated.  Current Meds  Medication Sig  . acyclovir (ZOVIRAX) 200 MG capsule Take 1 capsule (200 mg total) by mouth 5 (five) times daily.  [name] atenolol (TENORMIN) 50 MG tablet  Take 1 tablet (50 mg total) by mouth daily.  [name] atorvastatin (LIPITOR) 10 MG tablet Take 1 tablet (10 mg total) by mouth at bedtime.  . Bilberry 100 MG CAPS Take by mouth.  . celecoxib (CELEBREX) 200 MG capsule Take 1 capsule (200 mg total) by mouth daily.  . Coenzyme Q10 (CO Q 10) 100 MG CAPS Take 1 capsule by mouth daily.   . Continuous Blood Gluc Receiver (FREESTYLE LIBRE 14 DAY READER) DEVI 1 each by Does not apply route every 4 (four) hours as needed.  . Continuous Blood Gluc Sensor (FREESTYLE LIBRE 14 DAY SENSOR) MISC 1 each by Does not apply route every 14 (fourteen) days.  [name] glucose blood (FREESTYLE LITE) test strip Use to test BS once daily  . hydrochlorothiazide (HYDRODIURIL) 12.5 MG tablet Take 1 tablet (12.5 mg total) by mouth daily.  . Lancets (FREESTYLE) lancets Use to test BS once daily  . Lutein 20 MG TABS Take 1 tablet by mouth daily.   . metFORMIN (GLUCOPHAGE) 1000 MG tablet Take 1 tablet (1,000 mg total) by mouth 2 (two) times daily with a meal.  . milk thistle 175 MG tablet Take 175 mg by mouth daily.  . MULTIPLE VITAMIN PO 1 tablet daily.   [name] olopatadine (PATANOL) 0.1 % ophthalmic solution Apply to eye.  . Omega-3 Fatty Acids (FISH OIL PEARLS PO) Take by mouth.  . Omeprazole 20 MG TBEC Take 1 tablet (20 mg total) by mouth daily.  [name]  PARoxetine (PAXIL) 20 MG tablet Take 1 tablet (20 mg total) by mouth daily.  . sitaGLIPtin (JANUVIA) 100 MG tablet Take 1 tablet (100 mg total) by mouth daily.  . TURMERIC PO Take 800 mg by mouth.  . valsartan (DIOVAN) 80 MG tablet Take 1 tablet (80 mg total) by mouth daily.  . vitamin E (VITAMIN E) 400 UNIT capsule Take 1 capsule (400 Units total) by mouth daily.  . zaleplon (SONATA) 5 MG capsule Take 1 capsule (5 mg total) by mouth at bedtime as needed for sleep.    PHQ 2/9 Scores 08/13/2018 03/08/2018 11/18/2017 02/16/2017  PHQ - 2 Score 0 0 0 0  PHQ- 9 Score 0 0 - -    Physical Exam Vitals signs and nursing note reviewed.  Constitutional:       General: She is not in acute distress.    Appearance: She is well-developed.  HENT:     Head: Normocephalic and atraumatic.     Right Ear: Tympanic membrane and ear canal normal.     Left Ear: Tympanic membrane and ear canal normal.     Nose:     Right Sinus: No maxillary sinus tenderness.     Left Sinus: No maxillary sinus tenderness.     Mouth/Throat:     Pharynx: Uvula midline.  Eyes:     General: No scleral icterus.       Right eye: No discharge.        Left eye: No discharge.     Conjunctiva/sclera: Conjunctivae normal.  Neck:     Musculoskeletal: Normal range of motion. No erythema.     Thyroid: No thyromegaly.     Vascular: No carotid bruit.  Cardiovascular:     Rate and Rhythm: Normal rate and regular rhythm.     Pulses: Normal pulses.     Heart sounds: Normal heart sounds.  Pulmonary:     Effort: Pulmonary effort is normal. No respiratory distress.     Breath sounds: No wheezing.  Chest:     Breasts:        Right: No mass, nipple discharge, skin change or tenderness.        Left: No mass, nipple discharge, skin change or tenderness.  Abdominal:     General: Bowel sounds are normal.     Palpations: Abdomen is soft.     Tenderness: There is no abdominal tenderness.  Musculoskeletal: Normal range of motion.     Comments: Boot on left foot - seen by podiatry  Lymphadenopathy:     Cervical: No cervical adenopathy.  Skin:    General: Skin is warm and dry.     Findings: No rash.  Neurological:     Mental Status: She is alert and oriented to person, place, and time.     Cranial Nerves: No cranial nerve deficit.     Sensory: No sensory deficit.     Deep Tendon Reflexes: Reflexes are normal and symmetric.  Psychiatric:        Speech: Speech normal.        Behavior: Behavior normal.        Thought Content: Thought content normal.     BP 132/78 (BP Location: Right Arm, Patient Position: Sitting, Cuff Size: Normal)   Pulse 72   Ht 5\' 4"  (1.626 m)   Wt 155 lb  (70.3 kg)   SpO2 98%   BMI 26.61 kg/m   Assessment and Plan: 1. Essential hypertension controlled - CBC with Differential/Platelet - Comprehensive metabolic  panel - POCT urinalysis dipstick  2. Gastroesophageal reflux disease, esophagitis presence not specified Continue PPI as needed  3. Controlled type 2 diabetes mellitus without complication, without long-term current use of insulin ([facility]) Controlled on oral agents Will not do PA on Freestyle Libre - Hemoglobin A1c - TSH - glucose blood (FREESTYLE LITE) test strip; Use to test BS twice daily  Dispense: 100 each; Refill: 3 - Continuous Blood Gluc Sensor (FREESTYLE LIBRE 14 DAY SENSOR) MISC; 1 each by Does not apply route every 14 (fourteen) days.  Dispense: 8 each; Refill: 3  4. Hyperlipidemia associated with type 2 diabetes mellitus ([place]) On statin - Lipid panel  5. Depression, major, single episode, in partial remission ([place]) Doing well on Paxil  6. Encounter for screening mammogram for breast cancer To schedule - MM 3D SCREEN BREAST BILATERAL; Future  7. Primary insomnia Increase dose to 10 mg - pt can cut in half if desired - zaleplon (SONATA) 10 MG capsule; Take 1 capsule (10 mg total) by mouth at bedtime as needed for sleep.  Dispense: 90 capsule; Refill: 1  8. Slow transit constipation Begin Linzess - linaclotide (LINZESS) 145 MCG CAPS capsule; Take 1 capsule (145 mcg total) by mouth daily before breakfast.  Dispense: 90 capsule; Refill: 1   Partially dictated using Editor, commissioning. Any errors are unintentional.  Halina Maidens, MD [facility]  08/13/2018

## 2018-08-14 LAB — COMPREHENSIVE METABOLIC PANEL
ALT: 33 IU/L — AB (ref 0–32)
AST: 24 IU/L (ref 0–40)
Albumin/Globulin Ratio: 1.9 (ref 1.2–2.2)
Albumin: 4.8 g/dL (ref 3.6–4.8)
Alkaline Phosphatase: 60 IU/L (ref 39–117)
BILIRUBIN TOTAL: 0.5 mg/dL (ref 0.0–1.2)
BUN/Creatinine Ratio: 15 (ref 12–28)
BUN: 14 mg/dL (ref 8–27)
CHLORIDE: 99 mmol/L (ref 96–106)
CO2: 23 mmol/L (ref 20–29)
Calcium: 10.2 mg/dL (ref 8.7–10.3)
Creatinine, Ser: 0.96 mg/dL (ref 0.57–1.00)
GFR calc non Af Amer: 61 mL/min/{1.73_m2} (ref >59)
GFR, EST AFRICAN AMERICAN: 71 mL/min/{1.73_m2} (ref >59)
Globulin, Total: 2.5 g/dL (ref 1.5–4.5)
Glucose: 124 mg/dL — ABNORMAL HIGH (ref 65–99)
Potassium: 4 mmol/L (ref 3.5–5.2)
Sodium: 138 mmol/L (ref 134–144)
TOTAL PROTEIN: 7.3 g/dL (ref 6.0–8.5)

## 2018-08-14 LAB — LIPID PANEL
Chol/HDL Ratio: 3.2 ratio (ref 0.0–4.4)
Cholesterol, Total: 161 mg/dL (ref 100–199)
HDL: 50 mg/dL (ref >39)
LDL CALC: 79 mg/dL (ref 0–99)
Triglycerides: 158 mg/dL — ABNORMAL HIGH (ref 0–149)
VLDL Cholesterol Cal: 32 mg/dL (ref 5–40)

## 2018-08-14 LAB — CBC WITH DIFFERENTIAL/PLATELET
Basophils Absolute: 0.2 10*3/uL (ref 0.0–0.2)
Basos: 2 %
EOS (ABSOLUTE): 0.3 10*3/uL (ref 0.0–0.4)
Eos: 4 %
Hematocrit: 39.3 % (ref 34.0–46.6)
Hemoglobin: 13.1 g/dL (ref 11.1–15.9)
Immature Grans (Abs): 0.1 10*3/uL (ref 0.0–0.1)
Immature Granulocytes: 1 %
LYMPHS: 39 %
Lymphocytes Absolute: 3.2 10*3/uL — ABNORMAL HIGH (ref 0.7–3.1)
MCH: 30.3 pg (ref 26.6–33.0)
MCHC: 33.3 g/dL (ref 31.5–35.7)
MCV: 91 fL (ref 79–97)
Monocytes Absolute: 0.5 10*3/uL (ref 0.1–0.9)
Monocytes: 7 %
Neutrophils Absolute: 4 10*3/uL (ref 1.4–7.0)
Neutrophils: 47 %
PLATELETS: 302 10*3/uL (ref 150–450)
RBC: 4.32 x10E6/uL (ref 3.77–5.28)
RDW: 13.3 % (ref 12.3–15.4)
WBC: 8.2 10*3/uL (ref 3.4–10.8)

## 2018-08-14 LAB — HEMOGLOBIN A1C
Est. average glucose Bld gHb Est-mCnc: 131 mg/dL
Hgb A1c MFr Bld: 6.2 % — ABNORMAL HIGH (ref 4.8–5.6)

## 2018-08-14 LAB — TSH: TSH: 2.41 u[IU]/mL (ref 0.450–4.500)

## 2018-09-01 ENCOUNTER — Ambulatory Visit (INDEPENDENT_AMBULATORY_CARE_PROVIDER_SITE_OTHER): Payer: Medicare Other | Admitting: Podiatry

## 2018-09-01 ENCOUNTER — Encounter: Payer: Self-pay | Admitting: Podiatry

## 2018-09-01 DIAGNOSIS — M7662 Achilles tendinitis, left leg: Secondary | ICD-10-CM | POA: Diagnosis not present

## 2018-09-01 NOTE — Progress Notes (Signed)
She presents today for follow-up of her Achilles tendinosis of her left foot stating that is feeling better she gets some sharp pains but all in all she is doing a whole lot better she has discontinued the Celebrex in the boot.  Objective: Vital signs are stable she alert oriented x3 no reproducible pain on palpation of the left foot is no longer warm and there is no fluctuance in the soft tissues by the Achilles.  Assessment: Resolving Achilles tendinitis left.  Plan: Follow-up with [place] in 1 month if necessary otherwise she is to continue Celebrex x1 month.  She will also continue taping the Achilles and keeping it stretched.

## 2018-09-08 ENCOUNTER — Ambulatory Visit
Admission: RE | Admit: 2018-09-08 | Discharge: 2018-09-08 | Disposition: A | Discharge status: Home / Self Care | Payer: Medicare Other | Source: Ambulatory Visit | Attending: Internal Medicine | Admitting: Internal Medicine

## 2018-09-08 DIAGNOSIS — Z1231 Encounter for screening mammogram for malignant neoplasm of breast: Secondary | ICD-10-CM | POA: Diagnosis not present

## 2018-10-20 ENCOUNTER — Other Ambulatory Visit: Payer: Self-pay

## 2018-10-20 ENCOUNTER — Ambulatory Visit (INDEPENDENT_AMBULATORY_CARE_PROVIDER_SITE_OTHER): Payer: Medicare Other | Admitting: Internal Medicine

## 2018-10-20 ENCOUNTER — Encounter: Payer: Self-pay | Admitting: Internal Medicine

## 2018-10-20 VITALS — BP 148/72 | HR 64 | Ht 64.0 in | Wt 150.0 lb

## 2018-10-20 DIAGNOSIS — D489 Neoplasm of uncertain behavior, unspecified: Secondary | ICD-10-CM | POA: Diagnosis not present

## 2018-10-20 NOTE — Progress Notes (Signed)
Date:  10/20/2018   Name:  Jenna Stein   DOB:  04-07-1951   MRN:  [id number]   Chief Complaint: Mass (Bump located on groin. Patient said it is growing. Wanted to have checked out.)  Patient reports various keratoses and skin tags, some of which she pulls off herself.  However, recently noticed an area in her left groin that is irritated and elongated.  She may have tried to pull of a skin tag in the same region.  Review of Systems  Constitutional: Negative for chills and unexpected weight change.  Respiratory: Negative for chest tightness and shortness of breath.   Cardiovascular: Negative for chest pain.  Skin: Positive for color change. Negative for rash and wound.    Patient Active Problem List   Diagnosis Date Noted  . Gastroesophageal reflux disease 08/13/2018  . Slow transit constipation 08/13/2018  . Controlled type 2 diabetes mellitus without complication, without long-term current use of insulin ([place]) 02/10/2017  . Dry mouth 10/10/2016  . Ovarian failure 10/10/2016  . Abnormal ECG 04/30/2015  . Allergic rhinitis, seasonal 04/30/2015  . Anxiety 04/30/2015  . Arthritis 04/30/2015  . Hepatic steatosis 04/30/2015  . Osteoarthritis of both hands 04/30/2015  . Blood in the urine 04/30/2015  . Genital herpes 04/04/2009  . Bone/cartilage disorder 08/04/2003  . Depression, major, single episode, in partial remission ([place]) 08/18/1998  . Essential hypertension 08/18/1998  . Hyperlipidemia associated with type 2 diabetes mellitus ([place]) 08/18/1998  . Insomnia 08/18/1998    Allergies  Allergen Reactions  . Meloxicam Other (See Comments)  . Clonazepam Other (See Comments)    Nocturnal enuresis; Lethargy   . Trazodone Anxiety and Other (See Comments)    Dizziness     Past Surgical History:  Procedure Laterality Date  . CYST EXCISION Left 2017   foot  . HAND SURGERY  07/2012    Social History   Tobacco Use  . Smoking status: Never Smoker  . Smokeless tobacco:  Never Used  . Tobacco comment: smoking cessation materials not required  Substance Use Topics  . Alcohol use: No  . Drug use: No     Medication list has been reviewed and updated.  Current Meds  Medication Sig  . acyclovir (ZOVIRAX) 200 MG capsule Take 1 capsule (200 mg total) by mouth 5 (five) times daily.  [name] atenolol (TENORMIN) 50 MG tablet Take 1 tablet (50 mg total) by mouth daily.  [name] atorvastatin (LIPITOR) 10 MG tablet Take 1 tablet (10 mg total) by mouth at bedtime.  . Bilberry 100 MG CAPS Take by mouth.  . celecoxib (CELEBREX) 200 MG capsule Take 1 capsule (200 mg total) by mouth daily.  . Coenzyme Q10 (CO Q 10) 100 MG CAPS Take 1 capsule by mouth daily.   . Continuous Blood Gluc Receiver (FREESTYLE LIBRE 14 DAY READER) DEVI 1 each by Does not apply route every 4 (four) hours as needed.  . Continuous Blood Gluc Sensor (FREESTYLE LIBRE 14 DAY SENSOR) MISC 1 each by Does not apply route every 14 (fourteen) days.  [name] glucose blood (FREESTYLE LITE) test strip Use to test BS twice daily  . hydrochlorothiazide (HYDRODIURIL) 12.5 MG tablet Take 1 tablet (12.5 mg total) by mouth daily.  . Lancets (FREESTYLE) lancets Use to test BS once daily  . linaclotide (LINZESS) 145 MCG CAPS capsule Take 1 capsule (145 mcg total) by mouth daily before breakfast.  . Lutein 20 MG TABS Take 1 tablet by mouth daily.   [name]  metFORMIN (GLUCOPHAGE) 1000 MG tablet Take 1 tablet (1,000 mg total) by mouth 2 (two) times daily with a meal.  . milk thistle 175 MG tablet Take 175 mg by mouth daily.  . MULTIPLE VITAMIN PO 1 tablet daily.   [name] olopatadine (PATANOL) 0.1 % ophthalmic solution Apply to eye.  . Omega-3 Fatty Acids (FISH OIL PEARLS PO) Take by mouth.  . Omeprazole 20 MG TBEC Take 1 tablet (20 mg total) by mouth daily.  [name] PARoxetine (PAXIL) 20 MG tablet Take 1 tablet (20 mg total) by mouth daily.  . sitaGLIPtin (JANUVIA) 100 MG tablet Take 1 tablet (100 mg total) by mouth daily.  . TURMERIC PO Take 800 mg  by mouth.  . valsartan (DIOVAN) 80 MG tablet Take 1 tablet (80 mg total) by mouth daily.  . vitamin E (VITAMIN E) 400 UNIT capsule Take 1 capsule (400 Units total) by mouth daily.  . zaleplon (SONATA) 10 MG capsule Take 1 capsule (10 mg total) by mouth at bedtime as needed for sleep.    PHQ 2/9 Scores 10/20/2018 08/13/2018 03/08/2018 11/18/2017  PHQ - 2 Score 0 0 0 0  PHQ- 9 Score - 0 0 -    Physical Exam Vitals signs and nursing note reviewed.  Constitutional:      General: She is not in acute distress.    Appearance: She is well-developed.  HENT:     Head: Normocephalic and atraumatic.  Pulmonary:     Effort: Pulmonary effort is normal. No respiratory distress.  Musculoskeletal: Normal range of motion.  Skin:    General: Skin is warm and dry.     Findings: No rash.          Comments: Other multiple AK, SK and benign appearing nevi over legs, chest and back  Neurological:     Mental Status: She is alert and oriented to person, place, and time.  Psychiatric:        Behavior: Behavior normal.        Thought Content: Thought content normal.     BP (!) 148/72   Pulse 64   Ht 5\' 4"  (1.626 m)   Wt 150 lb (68 kg)   SpO2 97%   BMI 25.75 kg/m   Assessment and Plan: 1. Neoplasm of uncertain behavior Avoid irritating the lesions Refer to Dermatology - Ambulatory referral to Dermatology   Partially dictated using Dragon software. Any errors are unintentional.  Halina Maidens, MD [facility]  10/20/2018

## 2018-10-20 NOTE — Patient Instructions (Signed)
[phone]  Dr. Tobie [facility] in [place]

## 2018-10-25 DIAGNOSIS — L821 Other seborrheic keratosis: Secondary | ICD-10-CM | POA: Diagnosis not present

## 2018-12-13 ENCOUNTER — Other Ambulatory Visit: Payer: Self-pay

## 2018-12-13 MED ORDER — CELECOXIB 200 MG PO CAPS: 200.0000 mg | ORAL_CAPSULE | Freq: Every day | ORAL | 2 refills | Status: DC

## 2018-12-13 NOTE — Telephone Encounter (Signed)
Pharmacy refill request for Celebrex 200mg    Per Dr. Stephenie Acres verbal order, ok to refill but needs follow up appt.  Script has been sent to pharmacy

## 2018-12-20 ENCOUNTER — Other Ambulatory Visit: Payer: Self-pay | Admitting: Internal Medicine

## 2018-12-20 ENCOUNTER — Other Ambulatory Visit: Payer: Self-pay

## 2018-12-20 DIAGNOSIS — F5101 Primary insomnia: Secondary | ICD-10-CM

## 2018-12-20 DIAGNOSIS — K219 Gastro-esophageal reflux disease without esophagitis: Secondary | ICD-10-CM

## 2018-12-20 DIAGNOSIS — F419 Anxiety disorder, unspecified: Secondary | ICD-10-CM

## 2018-12-20 DIAGNOSIS — A6000 Herpesviral infection of urogenital system, unspecified: Secondary | ICD-10-CM

## 2018-12-20 DIAGNOSIS — I10 Essential (primary) hypertension: Secondary | ICD-10-CM

## 2018-12-20 DIAGNOSIS — K5901 Slow transit constipation: Secondary | ICD-10-CM

## 2018-12-20 DIAGNOSIS — E119 Type 2 diabetes mellitus without complications: Secondary | ICD-10-CM

## 2018-12-20 MED ORDER — METFORMIN HCL 1000 MG PO TABS: 1000.0000 mg | ORAL_TABLET | Freq: Two times a day (BID) | ORAL | 1 refills | Status: DC

## 2018-12-20 MED ORDER — ACYCLOVIR 200 MG PO CAPS: 200.0000 mg | ORAL_CAPSULE | Freq: Every day | ORAL | 1 refills | Status: DC

## 2018-12-20 MED ORDER — OMEPRAZOLE 20 MG PO TBEC: 20.0000 mg | DELAYED_RELEASE_TABLET | Freq: Every day | ORAL | 1 refills | Status: DC

## 2018-12-20 MED ORDER — PAROXETINE HCL 20 MG PO TABS: 20.0000 mg | ORAL_TABLET | Freq: Every day | ORAL | 1 refills | Status: DC

## 2018-12-20 MED ORDER — SITAGLIPTIN PHOSPHATE 100 MG PO TABS: 100.0000 mg | ORAL_TABLET | Freq: Every day | ORAL | 1 refills | Status: DC

## 2018-12-20 MED ORDER — ZALEPLON 10 MG PO CAPS: 10.0000 mg | ORAL_CAPSULE | Freq: Every evening | ORAL | 1 refills | Status: DC | PRN

## 2018-12-20 MED ORDER — LINACLOTIDE 145 MCG PO CAPS: 145.0000 ug | ORAL_CAPSULE | Freq: Every day | ORAL | 1 refills | Status: DC

## 2018-12-20 MED ORDER — VALSARTAN 80 MG PO TABS: 80.0000 mg | ORAL_TABLET | Freq: Every day | ORAL | 1 refills | Status: DC

## 2018-12-29 ENCOUNTER — Other Ambulatory Visit: Payer: Self-pay

## 2018-12-29 DIAGNOSIS — E782 Mixed hyperlipidemia: Secondary | ICD-10-CM

## 2018-12-29 MED ORDER — ATORVASTATIN CALCIUM 10 MG PO TABS: 10.0000 mg | ORAL_TABLET | Freq: Every day | ORAL | 1 refills | Status: DC

## 2019-02-04 DIAGNOSIS — Z20828 Contact with and (suspected) exposure to other viral communicable diseases: Secondary | ICD-10-CM | POA: Diagnosis not present

## 2019-02-09 ENCOUNTER — Other Ambulatory Visit: Payer: Self-pay | Admitting: Internal Medicine

## 2019-02-09 DIAGNOSIS — E119 Type 2 diabetes mellitus without complications: Secondary | ICD-10-CM

## 2019-02-14 ENCOUNTER — Encounter: Payer: Self-pay | Admitting: Internal Medicine

## 2019-02-14 ENCOUNTER — Ambulatory Visit (INDEPENDENT_AMBULATORY_CARE_PROVIDER_SITE_OTHER): Payer: Medicare Other | Admitting: Internal Medicine

## 2019-02-14 ENCOUNTER — Other Ambulatory Visit: Payer: Self-pay

## 2019-02-14 VITALS — BP 124/72 | HR 76 | Ht 64.0 in | Wt 145.0 lb

## 2019-02-14 DIAGNOSIS — E785 Hyperlipidemia, unspecified: Secondary | ICD-10-CM | POA: Diagnosis not present

## 2019-02-14 DIAGNOSIS — F5101 Primary insomnia: Secondary | ICD-10-CM

## 2019-02-14 DIAGNOSIS — E118 Type 2 diabetes mellitus with unspecified complications: Secondary | ICD-10-CM | POA: Diagnosis not present

## 2019-02-14 DIAGNOSIS — I10 Essential (primary) hypertension: Secondary | ICD-10-CM

## 2019-02-14 DIAGNOSIS — E1169 Type 2 diabetes mellitus with other specified complication: Secondary | ICD-10-CM | POA: Diagnosis not present

## 2019-02-14 DIAGNOSIS — F324 Major depressive disorder, single episode, in partial remission: Secondary | ICD-10-CM | POA: Diagnosis not present

## 2019-02-14 DIAGNOSIS — K5901 Slow transit constipation: Secondary | ICD-10-CM | POA: Diagnosis not present

## 2019-02-14 MED ORDER — LINACLOTIDE 72 MCG PO CAPS: 72.0000 ug | ORAL_CAPSULE | Freq: Every day | ORAL | 0 refills | Status: DC

## 2019-02-14 NOTE — Progress Notes (Signed)
Date:  02/14/2019   Name:  Jenna Stein   DOB:  July 28, 1951   MRN:  [id number]   Chief Complaint: Diabetes (6 month follow up.), Hypertension, Insomnia (Zaleplon not helping as much as and wants to know if she can try estazolam 1 mg?), and Constipation (Linzess causing diarrhea. Cannot go anywhere if having to take medication. Is there a alternative?)  Diabetes She presents for her follow-up diabetic visit. She has type 2 diabetes mellitus. Her disease course has been stable. Pertinent negatives for hypoglycemia include no headaches or tremors. Pertinent negatives for diabetes include no chest pain, no fatigue, no polydipsia and no polyuria. Current diabetic treatment includes oral agent (dual therapy) (metformin and januvia). She is compliant with treatment all of the time. An ACE inhibitor/angiotensin II receptor blocker is being taken.  Hypertension This is a chronic problem. The problem is controlled. Pertinent negatives include no chest pain, headaches, palpitations or shortness of breath. Past treatments include beta blockers, diuretics and angiotensin blockers. The current treatment provides significant improvement.  Hyperlipidemia The problem is controlled. Pertinent negatives include no chest pain or shortness of breath. Current antihyperlipidemic treatment includes statins. The current treatment provides significant improvement of lipids.  Insomnia Primary symptoms: sleep disturbance, premature morning awakening.  The problem occurs nightly. The problem is unchanged. Treatments tried: sonata did not help; [place] MD gave her prosom 1 mg and she has been taking both.  Constipation This is a chronic problem. Pertinent negatives include no abdominal pain or fever. Treatments tried: linzess 145 mg per day causes 3 or more stools per day with urgency.   Lab Results  Component Value Date   HGBA1C 6.2 (H) 08/13/2018   Lab Results  Component Value Date   CREATININE 0.96 08/13/2018   BUN 14 08/13/2018   NA 138 08/13/2018   K 4.0 08/13/2018   CL 99 08/13/2018   CO2 23 08/13/2018     Review of Systems  Constitutional: Negative for appetite change, fatigue, fever and unexpected weight change.  HENT: Negative for tinnitus and trouble swallowing.   Eyes: Negative for visual disturbance.  Respiratory: Negative for cough, chest tightness and shortness of breath.   Cardiovascular: Negative for chest pain, palpitations and leg swelling.  Gastrointestinal: Positive for constipation. Negative for abdominal pain.  Endocrine: Negative for polydipsia and polyuria.  Genitourinary: Negative for dysuria and hematuria.  Musculoskeletal: Negative for arthralgias.  Neurological: Negative for tremors, numbness and headaches.  Psychiatric/Behavioral: Positive for sleep disturbance. Negative for dysphoric mood. The patient has insomnia.     Patient Active Problem List   Diagnosis Date Noted  . Neoplasm of uncertain behavior 03/00/9233  . Gastroesophageal reflux disease 08/13/2018  . Slow transit constipation 08/13/2018  . Type II diabetes mellitus with complication ([place]) 00/76/2263  . Dry mouth 10/10/2016  . Ovarian failure 10/10/2016  . Abnormal ECG 04/30/2015  . Allergic rhinitis, seasonal 04/30/2015  . Anxiety 04/30/2015  . Arthritis 04/30/2015  . Hepatic steatosis 04/30/2015  . Osteoarthritis of both hands 04/30/2015  . Blood in the urine 04/30/2015  . Genital herpes 04/04/2009  . Bone/cartilage disorder 08/04/2003  . Depression, major, single episode, in partial remission ([place]) 08/18/1998  . Essential hypertension 08/18/1998  . Hyperlipidemia associated with type 2 diabetes mellitus ([place]) 08/18/1998  . Insomnia 08/18/1998    Allergies  Allergen Reactions  . Meloxicam Other (See Comments)  . Clonazepam Other (See Comments)    Nocturnal enuresis; Lethargy   . Trazodone Anxiety and Other (See Comments)  Dizziness     Past Surgical History:  Procedure  Laterality Date  . CYST EXCISION Left 2017   foot  . HAND SURGERY  07/2012    Social History   Tobacco Use  . Smoking status: Never Smoker  . Smokeless tobacco: Never Used  . Tobacco comment: smoking cessation materials not required  Substance Use Topics  . Alcohol use: No  . Drug use: No     Medication list has been reviewed and updated.  Current Meds  Medication Sig  . acyclovir (ZOVIRAX) 200 MG capsule Take 1 capsule (200 mg total) by mouth 5 (five) times daily.  [name] atenolol (TENORMIN) 50 MG tablet TAKE ONE TABLET BY MOUTH EVERY DAY  . atorvastatin (LIPITOR) 10 MG tablet Take 1 tablet (10 mg total) by mouth at bedtime.  . Bilberry 100 MG CAPS Take by mouth.  . celecoxib (CELEBREX) 200 MG capsule Take 1 capsule (200 mg total) by mouth daily.  . Coenzyme Q10 (CO Q 10) 100 MG CAPS Take 1 capsule by mouth daily.   . Continuous Blood Gluc Receiver (FREESTYLE LIBRE 14 DAY READER) DEVI 1 each by Does not apply route every 4 (four) hours as needed.  . Continuous Blood Gluc Sensor (FREESTYLE LIBRE 14 DAY SENSOR) MISC USE 1 SENSOR EVERY 14 (FOURTEEN) DAYS.  [name] glucose blood (FREESTYLE LITE) test strip Use to test BS twice daily  . hydrochlorothiazide (HYDRODIURIL) 12.5 MG tablet TAKE ONE TABLET BY MOUTH EVERY DAY  . Lancets (FREESTYLE) lancets Use to test BS once daily  . linaclotide (LINZESS) 145 MCG CAPS capsule Take 1 capsule (145 mcg total) by mouth daily before breakfast.  . Lutein 20 MG TABS Take 1 tablet by mouth daily.   . metFORMIN (GLUCOPHAGE) 1000 MG tablet Take 1 tablet (1,000 mg total) by mouth 2 (two) times daily with a meal.  . milk thistle 175 MG tablet Take 175 mg by mouth daily.  . MULTIPLE VITAMIN PO 1 tablet daily.   [name] olopatadine (PATANOL) 0.1 % ophthalmic solution Apply to eye.  . Omega-3 Fatty Acids (FISH OIL PEARLS PO) Take by mouth.  . Omeprazole 20 MG TBEC Take 1 tablet (20 mg total) by mouth daily.  [name] PARoxetine (PAXIL) 20 MG tablet Take 1 tablet (20 mg  total) by mouth daily.  . sitaGLIPtin (JANUVIA) 100 MG tablet Take 1 tablet (100 mg total) by mouth daily.  . TURMERIC PO Take 800 mg by mouth.  . valsartan (DIOVAN) 80 MG tablet Take 1 tablet (80 mg total) by mouth daily.  . vitamin E (VITAMIN E) 400 UNIT capsule Take 1 capsule (400 Units total) by mouth daily.  . zaleplon (SONATA) 10 MG capsule Take 1 capsule (10 mg total) by mouth at bedtime as needed for sleep.    PHQ 2/9 Scores 02/14/2019 10/20/2018 08/13/2018 03/08/2018  PHQ - 2 Score 0 0 0 0  PHQ- 9 Score 6 - 0 0    BP Readings from Last 3 Encounters:  02/14/19 124/72  10/20/18 (!) 148/72  08/13/18 132/78    Physical Exam Vitals signs and nursing note reviewed.  Constitutional:      General: She is not in acute distress.    Appearance: She is well-developed.  HENT:     Head: Normocephalic and atraumatic.  Cardiovascular:     Rate and Rhythm: Normal rate and regular rhythm.     Pulses: Normal pulses.  Pulmonary:     Effort: Pulmonary effort is normal. No respiratory distress.  Breath sounds: No wheezing or rhonchi.  Musculoskeletal: Normal range of motion.     Right lower leg: No edema.     Left lower leg: No edema.  Lymphadenopathy:     Cervical: No cervical adenopathy.  Skin:    General: Skin is warm and dry.     Capillary Refill: Capillary refill takes less than 2 seconds.     Findings: No rash.  Neurological:     Mental Status: She is alert and oriented to person, place, and time.  Psychiatric:        Behavior: Behavior normal.        Thought Content: Thought content normal.     Wt Readings from Last 3 Encounters:  02/14/19 145 lb (65.8 kg)  10/20/18 150 lb (68 kg)  08/13/18 155 lb (70.3 kg)    BP 124/72   Pulse 76   Ht 5\' 4"  (1.626 m)   Wt 145 lb (65.8 kg)   SpO2 97%   BMI 24.89 kg/m   Assessment and Plan: 1. Type II diabetes mellitus with complication (HCC) Controlled on current therapy - Hemoglobin D8Y - Basic metabolic panel  2.  Essential hypertension controlled  3. Hyperlipidemia associated with type 2 diabetes mellitus (HCC) Continue statin, check labs since AST slightly elevated - Hepatic function panel  4. Depression, major, single episode, in partial remission ([place]) Doing well on Paxil  5. Slow transit constipation Reduce dose to 72 mcg - linaclotide (LINZESS) 72 MCG capsule; Take 1 capsule (72 mcg total) by mouth daily before breakfast.  Dispense: 90 capsule; Refill: 0  6. Primary insomnia Pt advised not to take both meds Try Prosom 1 mg alone and if effective, call for a new Rx from me   Partially dictated using Editor, commissioning. Any errors are unintentional.  Halina Maidens, MD [facility]  02/14/2019

## 2019-02-15 LAB — BASIC METABOLIC PANEL
BUN/Creatinine Ratio: 14 (ref 12–28)
BUN: 12 mg/dL (ref 8–27)
CO2: 23 mmol/L (ref 20–29)
Calcium: 10 mg/dL (ref 8.7–10.3)
Chloride: 100 mmol/L (ref 96–106)
Creatinine, Ser: 0.85 mg/dL (ref 0.57–1.00)
GFR calc Af Amer: 82 mL/min/{1.73_m2} (ref >59)
GFR calc non Af Amer: 71 mL/min/{1.73_m2} (ref >59)
Glucose: 113 mg/dL — ABNORMAL HIGH (ref 65–99)
Potassium: 4.1 mmol/L (ref 3.5–5.2)
Sodium: 137 mmol/L (ref 134–144)

## 2019-02-15 LAB — HEPATIC FUNCTION PANEL
ALT: 20 IU/L (ref 0–32)
AST: 21 IU/L (ref 0–40)
Albumin: 4.9 g/dL — ABNORMAL HIGH (ref 3.8–4.8)
Alkaline Phosphatase: 63 IU/L (ref 39–117)
Bilirubin Total: 0.8 mg/dL (ref 0.0–1.2)
Bilirubin, Direct: 0.21 mg/dL (ref 0.00–0.40)
Total Protein: 7.4 g/dL (ref 6.0–8.5)

## 2019-02-15 LAB — HEMOGLOBIN A1C
Est. average glucose Bld gHb Est-mCnc: 126 mg/dL
Hgb A1c MFr Bld: 6 % — ABNORMAL HIGH (ref 4.8–5.6)

## 2019-03-02 ENCOUNTER — Other Ambulatory Visit: Payer: Self-pay | Admitting: Internal Medicine

## 2019-03-02 DIAGNOSIS — I10 Essential (primary) hypertension: Secondary | ICD-10-CM

## 2019-03-09 ENCOUNTER — Ambulatory Visit: Payer: Medicare Other

## 2019-03-11 ENCOUNTER — Other Ambulatory Visit: Payer: Self-pay | Admitting: Podiatry

## 2019-03-29 DIAGNOSIS — Z20828 Contact with and (suspected) exposure to other viral communicable diseases: Secondary | ICD-10-CM | POA: Diagnosis not present

## 2019-03-30 ENCOUNTER — Other Ambulatory Visit: Payer: Self-pay

## 2019-03-30 DIAGNOSIS — E782 Mixed hyperlipidemia: Secondary | ICD-10-CM

## 2019-03-30 DIAGNOSIS — E119 Type 2 diabetes mellitus without complications: Secondary | ICD-10-CM

## 2019-03-30 DIAGNOSIS — I10 Essential (primary) hypertension: Secondary | ICD-10-CM

## 2019-03-30 DIAGNOSIS — F419 Anxiety disorder, unspecified: Secondary | ICD-10-CM

## 2019-03-30 DIAGNOSIS — K219 Gastro-esophageal reflux disease without esophagitis: Secondary | ICD-10-CM

## 2019-03-30 DIAGNOSIS — K5901 Slow transit constipation: Secondary | ICD-10-CM

## 2019-03-30 DIAGNOSIS — A6000 Herpesviral infection of urogenital system, unspecified: Secondary | ICD-10-CM

## 2019-03-30 MED ORDER — ACYCLOVIR 200 MG PO CAPS: 200.0000 mg | ORAL_CAPSULE | Freq: Every day | ORAL | 1 refills | Status: DC

## 2019-03-30 MED ORDER — FREESTYLE LIBRE 2 READER SYSTM DEVI: 1.0000 | Freq: Two times a day (BID) | 0 refills | Status: DC

## 2019-03-30 MED ORDER — LINACLOTIDE 72 MCG PO CAPS: 72.0000 ug | ORAL_CAPSULE | Freq: Every day | ORAL | 1 refills | Status: DC

## 2019-03-30 MED ORDER — PAROXETINE HCL 20 MG PO TABS: 20.0000 mg | ORAL_TABLET | Freq: Every day | ORAL | 1 refills | Status: DC

## 2019-03-30 MED ORDER — OMEPRAZOLE 20 MG PO TBEC: 20.0000 mg | DELAYED_RELEASE_TABLET | Freq: Every day | ORAL | 1 refills | Status: DC

## 2019-03-30 MED ORDER — ATORVASTATIN CALCIUM 10 MG PO TABS: 10.0000 mg | ORAL_TABLET | Freq: Every day | ORAL | 1 refills | Status: DC

## 2019-03-30 MED ORDER — HYDROCHLOROTHIAZIDE 12.5 MG PO TABS: 12.5000 mg | ORAL_TABLET | Freq: Every day | ORAL | 1 refills | Status: DC

## 2019-03-30 MED ORDER — ATENOLOL 50 MG PO TABS: 50.0000 mg | ORAL_TABLET | Freq: Every day | ORAL | 1 refills | Status: DC

## 2019-03-30 MED ORDER — CELECOXIB 200 MG PO CAPS: ORAL_CAPSULE | ORAL | 1 refills | Status: DC

## 2019-03-30 MED ORDER — SITAGLIPTIN PHOSPHATE 100 MG PO TABS: 100.0000 mg | ORAL_TABLET | Freq: Every day | ORAL | 1 refills | Status: DC

## 2019-03-30 MED ORDER — METFORMIN HCL 1000 MG PO TABS: 1000.0000 mg | ORAL_TABLET | Freq: Two times a day (BID) | ORAL | 1 refills | Status: DC

## 2019-04-05 ENCOUNTER — Other Ambulatory Visit: Payer: Self-pay

## 2019-04-05 MED ORDER — FREESTYLE LIBRE 2 SENSOR SYSTM MISC: 1.0000 | 3 refills | Status: DC

## 2019-04-05 MED ORDER — FREESTYLE LIBRE 2 READER SYSTM DEVI: 1.0000 | 0 refills | Status: DC | PRN

## 2019-05-18 ENCOUNTER — Other Ambulatory Visit: Payer: Self-pay | Admitting: Internal Medicine

## 2019-05-18 ENCOUNTER — Other Ambulatory Visit: Payer: Self-pay

## 2019-05-18 DIAGNOSIS — F5101 Primary insomnia: Secondary | ICD-10-CM

## 2019-05-18 DIAGNOSIS — E119 Type 2 diabetes mellitus without complications: Secondary | ICD-10-CM

## 2019-05-18 DIAGNOSIS — I10 Essential (primary) hypertension: Secondary | ICD-10-CM

## 2019-05-18 MED ORDER — ZALEPLON 10 MG PO CAPS: 10.0000 mg | ORAL_CAPSULE | Freq: Every evening | ORAL | 1 refills | Status: DC | PRN

## 2019-05-18 MED ORDER — VALSARTAN 80 MG PO TABS: 80.0000 mg | ORAL_TABLET | Freq: Every day | ORAL | 1 refills | Status: DC

## 2019-05-18 MED ORDER — FREESTYLE LITE TEST VI STRP: ORAL_STRIP | 12 refills | Status: DC

## 2019-07-25 ENCOUNTER — Telehealth: Payer: Self-pay

## 2019-07-25 NOTE — Telephone Encounter (Signed)
Patient informed. Will do this and both appt will be virtual. Changed appts to appropriate time slots.

## 2019-07-25 NOTE — Telephone Encounter (Signed)
Patient called saying she is currently stuck in [place] due to the virus numbers rising. She said she has been stressed and her BP is staying elevated. Its running around 171/99 with a heart rate around the late 90s.   She said she has two appointments coming up, however she will need to do these over the phone or reschedule because she is still having to stay in [place]. Wants to know if she should increase her BP medication?  I will let her know she can do her AWV over the phone with [facility] but her follow will need to be rescheduled to a later date with you.  Please advise.

## 2019-07-25 NOTE — Telephone Encounter (Signed)
She can have her follow up in December with me over the phone but need to change the time to 11:00.  She can do the MAW over the phone at the time scheduled. In the meantime, double up the valsartan and take 2 per day.  Record BP three times per week and write it down for review at her visit.   She will not be able to have labs but we can do those when she returns to [facility].

## 2019-08-07 LAB — HEMOGLOBIN A1C: Hemoglobin A1C: 6.5

## 2019-08-09 ENCOUNTER — Telehealth: Payer: Self-pay | Admitting: Internal Medicine

## 2019-08-09 NOTE — Telephone Encounter (Signed)
Called to REschedule Medicare Annual Wellness Visit with Nurse Health Advisor, Kasey Uthus at [facility]. If patient returns call, please schedule AWV with [facility] ~ Any date on [facility] schedule (Mon or Wed)  Questions regarding scheduling, please call  [phone] or MS Teams > [email or web address]   Kathryn Brown  Care Guide . Embedded Care Coordination [place]  Care Management [email or web address]  [phone]     

## 2019-08-15 ENCOUNTER — Ambulatory Visit: Payer: Medicare Other

## 2019-08-16 ENCOUNTER — Encounter: Payer: Self-pay | Admitting: Internal Medicine

## 2019-08-16 ENCOUNTER — Encounter: Payer: Medicare Other | Admitting: Internal Medicine

## 2019-08-16 ENCOUNTER — Ambulatory Visit (INDEPENDENT_AMBULATORY_CARE_PROVIDER_SITE_OTHER): Payer: Medicare Other | Admitting: Internal Medicine

## 2019-08-16 VITALS — BP 156/84 | HR 69 | Ht 64.5 in | Wt 143.0 lb

## 2019-08-16 DIAGNOSIS — F419 Anxiety disorder, unspecified: Secondary | ICD-10-CM

## 2019-08-16 DIAGNOSIS — I10 Essential (primary) hypertension: Secondary | ICD-10-CM | POA: Diagnosis not present

## 2019-08-16 DIAGNOSIS — M19041 Primary osteoarthritis, right hand: Secondary | ICD-10-CM

## 2019-08-16 DIAGNOSIS — M19042 Primary osteoarthritis, left hand: Secondary | ICD-10-CM

## 2019-08-16 DIAGNOSIS — F324 Major depressive disorder, single episode, in partial remission: Secondary | ICD-10-CM | POA: Diagnosis not present

## 2019-08-16 DIAGNOSIS — A6000 Herpesviral infection of urogenital system, unspecified: Secondary | ICD-10-CM

## 2019-08-16 DIAGNOSIS — K219 Gastro-esophageal reflux disease without esophagitis: Secondary | ICD-10-CM

## 2019-08-16 DIAGNOSIS — E118 Type 2 diabetes mellitus with unspecified complications: Secondary | ICD-10-CM | POA: Diagnosis not present

## 2019-08-16 MED ORDER — OMEPRAZOLE 20 MG PO TBEC: 20.0000 mg | DELAYED_RELEASE_TABLET | Freq: Every day | ORAL | 1 refills | Status: DC

## 2019-08-16 MED ORDER — ACYCLOVIR 200 MG PO CAPS: 200.0000 mg | ORAL_CAPSULE | Freq: Every day | ORAL | 1 refills | Status: DC

## 2019-08-16 MED ORDER — CELECOXIB 200 MG PO CAPS: ORAL_CAPSULE | ORAL | 1 refills | Status: DC

## 2019-08-16 MED ORDER — HYDROCHLOROTHIAZIDE 12.5 MG PO TABS: 12.5000 mg | ORAL_TABLET | Freq: Every day | ORAL | 1 refills | Status: DC

## 2019-08-16 MED ORDER — VALSARTAN 320 MG PO TABS: 320.0000 mg | ORAL_TABLET | Freq: Every day | ORAL | 3 refills | Status: DC

## 2019-08-16 NOTE — Patient Instructions (Signed)
Continue to monitor your blood pressure several times a week.  It should improve over the next month after increasing the blood pressure medications.

## 2019-08-16 NOTE — Progress Notes (Signed)
Date:  08/16/2019   Name:  Jenna Stein   DOB:  15-Oct-1950   MRN:  [id number]  I connected with this patient, Jenna Stein, by telephone at the patient's daughter's house in [place].  I verified that I am speaking with the correct person using two identifiers. This visit was conducted via telephone due to the Covid-19 outbreak from my office at [facility] in [place], [place]. I discussed the limitations, risks, security and privacy concerns of performing an evaluation and management service by telephone. I also discussed with the patient that there may be a patient responsible charge related to this service. The patient expressed understanding and agreed to proceed.  Chief Complaint: Medication Refill (acyclovir, paxil, hctz, omeprazole, celebrex) and Depression (PHQ9=3 and GAD7=3)  Diabetes She presents for her follow-up diabetic visit. She has type 2 diabetes mellitus. Her disease course has been stable. Hypoglycemia symptoms include nervousness/anxiousness (she has cut back on Paxil to 10 mg). Pertinent negatives for hypoglycemia include no dizziness or headaches. Pertinent negatives for diabetes include no chest pain and no fatigue. There are no diabetic complications. Her weight is stable. She is following a generally healthy diet. She monitors blood glucose at home 1-2 x per day. Her breakfast blood glucose is taken between 7-8 am. Her breakfast blood glucose range is generally 110-130 mg/dl. An ACE inhibitor/angiotensin II receptor blocker is being taken.  Hypertension This is a chronic problem. The problem has been gradually worsening since onset. The problem is uncontrolled (at home systolic up to 99991111). Pertinent negatives include no chest pain, headaches, palpitations or shortness of breath. Past treatments include diuretics, beta blockers and angiotensin blockers. There are no compliance problems (she doubled valsartan to 160 mg).  There is no history of kidney disease or  CAD/MI.  Gastroesophageal Reflux She complains of heartburn. She reports no abdominal pain or no chest pain. This is a recurrent problem. The problem occurs rarely. Pertinent negatives include no fatigue. She has tried a PPI for the symptoms. The treatment provided significant relief.    Lab Results  Component Value Date   CREATININE 0.85 02/14/2019   BUN 12 02/14/2019   NA 137 02/14/2019   K 4.1 02/14/2019   CL 100 02/14/2019   CO2 23 02/14/2019   Lab Results  Component Value Date   CHOL 161 08/13/2018   HDL 50 08/13/2018   LDLCALC 79 08/13/2018   TRIG 158 (H) 08/13/2018   CHOLHDL 3.2 08/13/2018   Lab Results  Component Value Date   TSH 2.410 08/13/2018   Lab Results  Component Value Date   HGBA1C 6.5 08/07/2019     Review of Systems  Constitutional: Negative for chills, fatigue, fever and unexpected weight change.  Respiratory: Negative for chest tightness and shortness of breath.   Cardiovascular: Negative for chest pain, palpitations and leg swelling.  Gastrointestinal: Positive for heartburn. Negative for abdominal pain, constipation (seems to have resolved since the visit to [place]) and diarrhea.  Neurological: Negative for dizziness, light-headedness and headaches.  Psychiatric/Behavioral: Negative for dysphoric mood and sleep disturbance. The patient is nervous/anxious (she has cut back on Paxil to 10 mg).     Patient Active Problem List   Diagnosis Date Noted  . Neoplasm of uncertain behavior 99991111  . Gastroesophageal reflux disease 08/13/2018  . Slow transit constipation 08/13/2018  . Type II diabetes mellitus with complication ([place]) [id number]  . Dry mouth 10/10/2016  . Ovarian failure 10/10/2016  . Abnormal ECG 04/30/2015  .  Allergic rhinitis, seasonal 04/30/2015  . Anxiety 04/30/2015  . Arthritis 04/30/2015  . Hepatic steatosis 04/30/2015  . Osteoarthritis of both hands 04/30/2015  . Blood in the urine 04/30/2015  . Genital herpes 04/04/2009  .  Bone/cartilage disorder 08/04/2003  . Depression, major, single episode, in partial remission ([place]) 08/18/1998  . Essential hypertension 08/18/1998  . Hyperlipidemia associated with type 2 diabetes mellitus ([place]) 08/18/1998  . Insomnia 08/18/1998    Allergies  Allergen Reactions  . Meloxicam Other (See Comments)  . Clonazepam Other (See Comments)    Nocturnal enuresis; Lethargy   . Trazodone Anxiety and Other (See Comments)    Dizziness     Past Surgical History:  Procedure Laterality Date  . CYST EXCISION Left 2017   foot  . HAND SURGERY  07/2012    Social History   Tobacco Use  . Smoking status: Never Smoker  . Smokeless tobacco: Never Used  . Tobacco comment: smoking cessation materials not required  Substance Use Topics  . Alcohol use: No  . Drug use: No     Medication list has been reviewed and updated.  Current Meds  Medication Sig  . acyclovir (ZOVIRAX) 200 MG capsule Take 1 capsule (200 mg total) by mouth 5 (five) times daily.  [name] atenolol (TENORMIN) 50 MG tablet Take 1 tablet (50 mg total) by mouth daily.  [name] atorvastatin (LIPITOR) 10 MG tablet Take 1 tablet (10 mg total) by mouth at bedtime.  . Bilberry 100 MG CAPS Take by mouth.  . celecoxib (CELEBREX) 200 MG capsule TAKE 1 CAPSULE BY MOUTH EVERY DAY  . Coenzyme Q10 (CO Q 10) 100 MG CAPS Take 2 capsules by mouth daily.   . Continuous Blood Gluc Receiver (FREESTYLE LIBRE 2 READER SYSTM) DEVI 1 each by Does not apply route as needed. Use to test Blood Sugar.  . Continuous Blood Gluc Sensor (FREESTYLE LIBRE 2 SENSOR SYSTM) MISC Apply 1 each topically every 14 (fourteen) days.  [name] glucose blood (FREESTYLE LITE) test strip Use to test BS twice daily  . hydrochlorothiazide (HYDRODIURIL) 12.5 MG tablet Take 1 tablet (12.5 mg total) by mouth daily.  . Lancets (FREESTYLE) lancets Use to test BS once daily  . Lutein 20 MG TABS Take 1 tablet by mouth daily.   . metFORMIN (GLUCOPHAGE) 1000 MG tablet Take 1 tablet  (1,000 mg total) by mouth 2 (two) times daily with a meal.  . MULTIPLE VITAMIN PO 1 tablet daily.   [name] olopatadine (PATANOL) 0.1 % ophthalmic solution Apply to eye.  . Omega-3 Fatty Acids (FISH OIL PEARLS PO) Take by mouth.  . Omeprazole 20 MG TBEC Take 1 tablet (20 mg total) by mouth daily.  [name] PARoxetine (PAXIL) 20 MG tablet Take 1 tablet (20 mg total) by mouth daily.  . sitaGLIPtin (JANUVIA) 100 MG tablet Take 1 tablet (100 mg total) by mouth daily.  . TURMERIC PO Take 800 mg by mouth.  . valsartan (DIOVAN) 320 MG tablet Take 1 tablet (320 mg total) by mouth daily.  . vitamin E (VITAMIN E) 400 UNIT capsule Take 1 capsule (400 Units total) by mouth daily.  . zaleplon (SONATA) 10 MG capsule Take 1 capsule (10 mg total) by mouth at bedtime as needed for sleep.  . [DISCONTINUED] acyclovir (ZOVIRAX) 200 MG capsule Take 1 capsule (200 mg total) by mouth 5 (five) times daily.  . [DISCONTINUED] celecoxib (CELEBREX) 200 MG capsule TAKE 1 CAPSULE BY MOUTH EVERY DAY  . [DISCONTINUED] hydrochlorothiazide (HYDRODIURIL) 12.5 MG tablet Take  1 tablet (12.5 mg total) by mouth daily.  . [DISCONTINUED] Omeprazole 20 MG TBEC Take 1 tablet (20 mg total) by mouth daily.  . [DISCONTINUED] valsartan (DIOVAN) 80 MG tablet Take 1 tablet (80 mg total) by mouth daily.    PHQ 2/9 Scores 08/16/2019 02/14/2019 10/20/2018 08/13/2018  PHQ - 2 Score 1 0 0 0  PHQ- 9 Score 3 6 - 0    BP Readings from Last 3 Encounters:  08/16/19 (!) 156/84  02/14/19 124/72  10/20/18 (!) 148/72    Physical Exam Pulmonary:     Effort: Pulmonary effort is normal.  Neurological:     Mental Status: She is alert.  Psychiatric:        Attention and Perception: Attention normal.        Mood and Affect: Mood normal.        Speech: Speech normal.        Cognition and Memory: Cognition normal.     Wt Readings from Last 3 Encounters:  08/16/19 143 lb (64.9 kg)  02/14/19 145 lb (65.8 kg)  10/20/18 150 lb (68 kg)    BP (!) 156/84    Pulse 69   Ht 5' 4.5" (1.638 m)   Wt 143 lb (64.9 kg)   BMI 24.17 kg/m   Assessment and Plan: 1. Essential hypertension BP is not controlled for unclear reasons - pt denies taking cold or sinus meds, dietary salt indiscretion, energy drinks or change in activity level.  She is anxious about being away from her husband who is here in [place]. - valsartan (DIOVAN) 320 MG tablet; Take 1 tablet (320 mg total) by mouth daily.  Dispense: 90 tablet; Refill: 3 - hydrochlorothiazide (HYDRODIURIL) 12.5 MG tablet; Take 1 tablet (12.5 mg total) by mouth daily.  Dispense: 90 tablet; Refill: 1  2. Type II diabetes mellitus with complication (HCC) Clinically stable by exam and report without s/s of hypoglycemia. Average BS 125 with [id number] 6.5 DM complicated by HTN. Tolerating medications, metformin, well without side effects or other concerns.  3. Depression, major, single episode, in partial remission ([place]) Clinically stable but with some increase in situational anxiety I recommend resuming Paxil 20 mg per day  4. Anxiety May improve with adjustment in Paxil dosing  5. Gastroesophageal reflux disease without esophagitis Symptoms well controlled on daily PPI No red flag signs such as weight loss, n/v, melena. Constipation is improved. Will continue daily PPI. - Omeprazole 20 MG TBEC; Take 1 tablet (20 mg total) by mouth daily.  Dispense: 90 tablet; Refill: 1  6. Genital herpes simplex, unspecified site On suppressive therapy - acyclovir (ZOVIRAX) 200 MG capsule; Take 1 capsule (200 mg total) by mouth 5 (five) times daily.  Dispense: 90 capsule; Refill: 1  7. Primary osteoarthritis of both hands - celecoxib (CELEBREX) 200 MG capsule; TAKE 1 CAPSULE BY MOUTH EVERY DAY  Dispense: 90 capsule; Refill: 1   Partially dictated using Editor, commissioning. Any errors are unintentional.  Halina Maidens, MD [facility]  08/16/2019

## 2019-08-31 ENCOUNTER — Ambulatory Visit (INDEPENDENT_AMBULATORY_CARE_PROVIDER_SITE_OTHER): Payer: Medicare Other

## 2019-08-31 VITALS — BP 153/81 | HR 69 | Ht 64.0 in | Wt 143.0 lb

## 2019-08-31 DIAGNOSIS — Z0000 Encounter for general adult medical examination without abnormal findings: Secondary | ICD-10-CM | POA: Diagnosis not present

## 2019-08-31 NOTE — Patient Instructions (Signed)
Jenna Stein , Thank you for taking time to come for your Medicare Wellness Visit. I appreciate your ongoing commitment to your health goals. Please review the following plan we discussed and let me know if I can assist you in the future.   Screening recommendations/referrals: Colonoscopy: done 07/23/11. Repeat in 2022. Mammogram: done 09/08/18. Please call [phone] to schedule your mammogram and bone density screening.  Bone Density: done 11/03/16 Recommended yearly ophthalmology/optometry visit for glaucoma screening and checkup Recommended yearly dental visit for hygiene and checkup  Vaccinations: Influenza vaccine: done 06/06/19 Pneumococcal vaccine: done 08/07/17 Tdap vaccine: done 04/18/11 Shingles vaccine: done 07/29/18    Conditions/risks identified: Recommend increasing physical activity.   Next appointment: Please follow up in one year for your Medicare Annual Wellness visit.     Preventive Care 5 Years and Older, Female Preventive care refers to lifestyle choices and visits with your health care provider that can promote health and wellness. What does preventive care include?  A yearly physical exam. This is also called an annual well check.  Dental exams once or twice a year.  Routine eye exams. Ask your health care provider how often you should have your eyes checked.  Personal lifestyle choices, including:  Daily care of your teeth and gums.  Regular physical activity.  Eating a healthy diet.  Avoiding tobacco and drug use.  Limiting alcohol use.  Practicing safe sex.  Taking low-dose aspirin every day.  Taking vitamin and mineral supplements as recommended by your health care provider. What happens during an annual well check? The services and screenings done by your health care provider during your annual well check will depend on your age, overall health, lifestyle risk factors, and family history of disease. Counseling  Your health care provider  may ask you questions about your:  Alcohol use.  Tobacco use.  Drug use.  Emotional well-being.  Home and relationship well-being.  Sexual activity.  Eating habits.  History of falls.  Memory and ability to understand (cognition).  Work and work Statistician.  Reproductive health. Screening  You may have the following tests or measurements:  Height, weight, and BMI.  Blood pressure.  Lipid and cholesterol levels. These may be checked every 5 years, or more frequently if you are over 58 years old.  Skin check.  Lung cancer screening. You may have this screening every year starting at age 69 if you have a 30-pack-year history of smoking and currently smoke or have quit within the past 15 years.  Fecal occult blood test (FOBT) of the stool. You may have this test every year starting at age 60.  Flexible sigmoidoscopy or colonoscopy. You may have a sigmoidoscopy every 5 years or a colonoscopy every 10 years starting at age 32.  Hepatitis C blood test.  Hepatitis B blood test.  Sexually transmitted disease (STD) testing.  Diabetes screening. This is done by checking your blood sugar (glucose) after you have not eaten for a while (fasting). You may have this done every 1-3 years.  Bone density scan. This is done to screen for osteoporosis. You may have this done starting at age 97.  Mammogram. This may be done every 1-2 years. Talk to your health care provider about how often you should have regular mammograms. Talk with your health care provider about your test results, treatment options, and if necessary, the need for more tests. Vaccines  Your health care provider may recommend certain vaccines, such as:  Influenza vaccine. This is recommended every year.  Tetanus, diphtheria, and acellular pertussis (Tdap, Td) vaccine. You may need a Td booster every 10 years.  Zoster vaccine. You may need this after age 3.  Pneumococcal 13-valent conjugate (PCV13) vaccine.  One dose is recommended after age 82.  Pneumococcal polysaccharide (PPSV23) vaccine. One dose is recommended after age 60. Talk to your health care provider about which screenings and vaccines you need and how often you need them. This information is not intended to replace advice given to you by your health care provider. Make sure you discuss any questions you have with your health care provider. Document Released: 08/31/2015 Document Revised: 04/23/2016 Document Reviewed: 06/05/2015 Elsevier Interactive Patient Education  2017 [place] Prevention in the Home Falls can cause injuries. They can happen to people of all ages. There are many things you can do to make your home safe and to help prevent falls. What can I do on the outside of my home?  Regularly fix the edges of walkways and driveways and fix any cracks.  Remove anything that might make you trip as you walk through a door, such as a raised step or threshold.  Trim any bushes or trees on the path to your home.  Use bright outdoor lighting.  Clear any walking paths of anything that might make someone trip, such as rocks or tools.  Regularly check to see if handrails are loose or broken. Make sure that both sides of any steps have handrails.  Any raised decks and porches should have guardrails on the edges.  Have any leaves, snow, or ice cleared regularly.  Use sand or salt on walking paths during winter.  Clean up any spills in your garage right away. This includes oil or grease spills. What can I do in the bathroom?  Use night lights.  Install grab bars by the toilet and in the tub and shower. Do not use towel bars as grab bars.  Use non-skid mats or decals in the tub or shower.  If you need to sit down in the shower, use a plastic, non-slip stool.  Keep the floor dry. Clean up any water that spills on the floor as soon as it happens.  Remove soap buildup in the tub or shower regularly.  Attach bath  mats securely with double-sided non-slip rug tape.  Do not have throw rugs and other things on the floor that can make you trip. What can I do in the bedroom?  Use night lights.  Make sure that you have a light by your bed that is easy to reach.  Do not use any sheets or blankets that are too big for your bed. They should not hang down onto the floor.  Have a firm chair that has side arms. You can use this for support while you get dressed.  Do not have throw rugs and other things on the floor that can make you trip. What can I do in the kitchen?  Clean up any spills right away.  Avoid walking on wet floors.  Keep items that you use a lot in easy-to-reach places.  If you need to reach something above you, use a strong step stool that has a grab bar.  Keep electrical cords out of the way.  Do not use floor polish or wax that makes floors slippery. If you must use wax, use non-skid floor wax.  Do not have throw rugs and other things on the floor that can make you trip. What can I do  with my stairs?  Do not leave any items on the stairs.  Make sure that there are handrails on both sides of the stairs and use them. Fix handrails that are broken or loose. Make sure that handrails are as long as the stairways.  Check any carpeting to make sure that it is firmly attached to the stairs. Fix any carpet that is loose or worn.  Avoid having throw rugs at the top or bottom of the stairs. If you do have throw rugs, attach them to the floor with carpet tape.  Make sure that you have a light switch at the top of the stairs and the bottom of the stairs. If you do not have them, ask someone to add them for you. What else can I do to help prevent falls?  Wear shoes that:  Do not have high heels.  Have rubber bottoms.  Are comfortable and fit you well.  Are closed at the toe. Do not wear sandals.  If you use a stepladder:  Make sure that it is fully opened. Do not climb a closed  stepladder.  Make sure that both sides of the stepladder are locked into place.  Ask someone to hold it for you, if possible.  Clearly mark and make sure that you can see:  Any grab bars or handrails.  First and last steps.  Where the edge of each step is.  Use tools that help you move around (mobility aids) if they are needed. These include:  Canes.  Walkers.  Scooters.  Crutches.  Turn on the lights when you go into a dark area. Replace any light bulbs as soon as they burn out.  Set up your furniture so you have a clear path. Avoid moving your furniture around.  If any of your floors are uneven, fix them.  If there are any pets around you, be aware of where they are.  Review your medicines with your doctor. Some medicines can make you feel dizzy. This can increase your chance of falling. Ask your doctor what other things that you can do to help prevent falls. This information is not intended to replace advice given to you by your health care provider. Make sure you discuss any questions you have with your health care provider. Document Released: 05/31/2009 Document Revised: 01/10/2016 Document Reviewed: 09/08/2014 Elsevier Interactive Patient Education  2017 [employer].

## 2019-08-31 NOTE — Progress Notes (Signed)
Subjective:   Jenna Stein is a 69 y.o. female who presents for Medicare Annual (Subsequent) preventive examination.  Virtual Visit via Telephone Note  I connected with Jenna Stein on 08/31/19 at  2:00 PM EST by telephone and verified that I am speaking with the correct person using two identifiers.  Medicare Annual Wellness visit completed telephonically due to Covid-19 pandemic.   Location: Patient: home Provider: office   I discussed the limitations, risks, security and privacy concerns of performing an evaluation and management service by telephone and the availability of in person appointments. The patient expressed understanding and agreed to proceed.  Some vital signs may be absent or patient reported.   Jenna Marker, LPN    Review of Systems:   Cardiac Risk Factors include: advanced age (>39men, >66 women);diabetes mellitus;dyslipidemia;hypertension     Objective:     Vitals: BP (!) 153/81   Pulse 69   Ht 5\' 4"  (1.626 m)   Wt 143 lb (64.9 kg)   BMI 24.55 kg/m   Body mass index is 24.55 kg/m.  Advanced Directives 08/31/2019 03/08/2018 02/16/2017 06/10/2016 05/09/2015  Does Patient Have a Medical Advance Directive? Yes Yes Yes Yes Yes  Type of Paramedic of [place];Living will [place];Living will [place];Living will [place];Living will Living will;Healthcare Power of [place] in Chart? Yes - validated most recent copy scanned in chart (See row information) No - copy requested No - copy requested - -    Tobacco Social History   Tobacco Use  Smoking Status Never Smoker  Smokeless Tobacco Never Used  Tobacco Comment   smoking cessation materials not required     Counseling given: Not Answered Comment: smoking cessation materials not required   Clinical Intake:  Pre-visit preparation completed: Yes  Pain : No/denies pain      BMI - recorded: 24.55 Nutritional Status: BMI of 19-24  Normal Nutritional Risks: None Diabetes: Yes CBG done?: No Did pt. bring in CBG monitor from home?: No   Nutrition Risk Assessment:  Has the patient had any N/V/D within the last 2 months?  No  Does the patient have any non-healing wounds?  No  Has the patient had any unintentional weight loss or weight gain?  No   Diabetes:  Is the patient diabetic?  Yes  If diabetic, was a CBG obtained today?  No  Did the patient bring in their glucometer from home?  No  How often do you monitor your CBG's? daily.   Financial Strains and Diabetes Management:  Are you having any financial strains with the device, your supplies or your medication? No .  Does the patient want to be seen by Chronic Care Management for management of their diabetes?  No  Would the patient like to be referred to a Nutritionist or for Diabetic Management?  No   Diabetic Exams:  Diabetic Eye Exam: Completed 04/08/18. Overdue for diabetic eye exam. Pt has been advised about the importance in completing this exam.   Diabetic Foot Exam: Completed 02/14/19.  How often do you need to have someone help you when you read instructions, pamphlets, or other written materials from your doctor or pharmacy?: 1 - Never  Interpreter Needed?: No  Information entered by :: Jenna Marker LPN  Past Medical History:  Diagnosis Date  . Allergy   . Anxiety   . Diabetes mellitus without complication (Jenna Stein)   .  GERD (gastroesophageal reflux disease)   . Hyperlipidemia   . Hypertension    Past Surgical History:  Procedure Laterality Date  . CYST EXCISION Left 2017   foot  . HAND SURGERY  07/2012   Family History  Problem Relation Age of Onset  . Hypertension Jenna Stein   . Hyperlipidemia Jenna Stein   . Arthritis Jenna Stein   . Parkinsonism Jenna Stein   . Arthritis Other   . Hypertension Other   . Hyperlipidemia Other   . Hypothyroidism Other   . Breast cancer Jenna Stein 67   Social  History   Socioeconomic History  . Marital status: Married    Spouse name: Not on file  . Number of children: 1  . Years of education: Not on file  . Highest education level: Bachelor's degree (e.g., BA, AB, BS)  Occupational History  . Occupation: Retired  Tobacco Use  . Smoking status: Never Smoker  . Smokeless tobacco: Never Used  . Tobacco comment: smoking cessation materials not required  Substance and Sexual Activity  . Alcohol use: No  . Drug use: No  . Sexual activity: Not Currently  Other Topics Concern  . Not on file  Social History Narrative  . Not on file   Social Determinants of Health   Financial Resource Strain: Low Risk   . Difficulty of Paying Living Expenses: Not hard at all  Food Insecurity: No Food Insecurity  . Worried About Charity fundraiser in the Last Year: Never true  . Ran Out of Food in the Last Year: Never true  Transportation Needs: No Transportation Needs  . Lack of Transportation (Medical): No  . Lack of Transportation (Non-Medical): No  Physical Activity: Inactive  . Days of Exercise per Week: 0 days  . Minutes of Exercise per Session: 0 min  Stress: No Stress Concern Present  . Feeling of Stress : Not at all  Social Connections: Unknown  . Frequency of Communication with Friends and Family: Patient refused  . Frequency of Social Gatherings with Friends and Family: Patient refused  . Attends Religious Services: Patient refused  . Active Member of Clubs or Organizations: Patient refused  . Attends Archivist Meetings: Patient refused  . Marital Status: Married    Outpatient Encounter Medications as of 08/31/2019  Medication Sig  . acyclovir (ZOVIRAX) 200 MG capsule Take 1 capsule (200 mg total) by mouth 5 (five) times daily.  [name] atenolol (TENORMIN) 50 MG tablet Take 1 tablet (50 mg total) by mouth daily.  [name] atorvastatin (LIPITOR) 10 MG tablet Take 1 tablet (10 mg total) by mouth at bedtime.  . Bilberry 100 MG CAPS Take by  mouth.  . celecoxib (CELEBREX) 200 MG capsule TAKE 1 CAPSULE BY MOUTH EVERY DAY  . Coenzyme Q10 (CO Q 10) 100 MG CAPS Take 2 capsules by mouth daily.   . Continuous Blood Gluc Receiver (FREESTYLE LIBRE 2 READER SYSTM) DEVI 1 each by Does not apply route as needed. Use to test Blood Sugar.  . Continuous Blood Gluc Sensor (FREESTYLE LIBRE 2 SENSOR SYSTM) MISC Apply 1 each topically every 14 (fourteen) days.  [name] glucose blood (FREESTYLE LITE) test strip Use to test BS twice daily  . hydrochlorothiazide (HYDRODIURIL) 12.5 MG tablet Take 1 tablet (12.5 mg total) by mouth daily.  . Lancets (FREESTYLE) lancets Use to test BS once daily  . Lutein 20 MG TABS Take 1 tablet by mouth daily.   . metFORMIN (GLUCOPHAGE) 1000 MG tablet Take 1 tablet (1,000 mg total)  by mouth 2 (two) times daily with a meal.  . milk thistle 175 MG tablet Take 175 mg by mouth daily.  . MULTIPLE VITAMIN PO 1 tablet daily.   [name] olopatadine (PATANOL) 0.1 % ophthalmic solution Apply to eye.  . Omega-3 Fatty Acids (FISH OIL PEARLS PO) Take by mouth.  . Omeprazole 20 MG TBEC Take 1 tablet (20 mg total) by mouth daily.  [name] PARoxetine (PAXIL) 20 MG tablet Take 1 tablet (20 mg total) by mouth daily.  . sitaGLIPtin (JANUVIA) 100 MG tablet Take 1 tablet (100 mg total) by mouth daily.  . TURMERIC PO Take 800 mg by mouth.  . valsartan (DIOVAN) 320 MG tablet Take 1 tablet (320 mg total) by mouth daily.  . vitamin E (VITAMIN E) 400 UNIT capsule Take 1 capsule (400 Units total) by mouth daily.  . zaleplon (SONATA) 10 MG capsule Take 1 capsule (10 mg total) by mouth at bedtime as needed for sleep.  [name] linaclotide (LINZESS) 72 MCG capsule Take 1 capsule (72 mcg total) by mouth daily before breakfast. (Patient not taking: Reported on 08/16/2019)   No facility-administered encounter medications on file as of 08/31/2019.    Activities of Daily Living In your present state of health, do you have any difficulty performing the following activities:  08/31/2019  Hearing? N  Comment declines hearing aids  Vision? N  Difficulty concentrating or making decisions? N  Walking or climbing stairs? N  Dressing or bathing? N  Doing errands, shopping? N  Preparing Food and eating ? N  Using the Toilet? N  In the past six months, have you accidently leaked urine? N  Do you have problems with loss of bowel control? N  Managing your Medications? N  Managing your Finances? N  Housekeeping or managing your Housekeeping? N  Some recent data might be hidden    Patient Care Team: Glean Hess, MD as PCP - General (Family Medicine) [facility] (Ophthalmology)    Assessment:   This is a routine wellness examination for Herbert Seta.  Exercise Activities and Dietary recommendations Current Exercise Habits: The patient does not participate in regular exercise at present, Exercise limited by: None identified  Goals    . DIET - INCREASE WATER INTAKE     Recommend to drink at least 6-8 8oz glasses of water per day.       Fall Risk Fall Risk  08/31/2019 08/16/2019 02/14/2019 10/20/2018 03/08/2018  Falls in the past year? 0 0 0 0 No  Number falls in past yr: 0 - 0 0 -  Injury with Fall? 0 - 0 0 -  Risk for fall due to : No Fall Risks - - - Impaired vision;Medication side effect  Risk for fall due to: Comment - - - - wears eyeglasses  Follow up Falls prevention discussed Falls evaluation completed Falls evaluation completed Falls evaluation completed -   FALL RISK PREVENTION PERTAINING TO THE HOME:  Any stairs in or around the home? Yes  If so, do they handrails? Yes   Home free of loose throw rugs in walkways, pet beds, electrical cords, etc? Yes  Adequate lighting in your home to reduce risk of falls? Yes   ASSISTIVE DEVICES UTILIZED TO PREVENT FALLS:  Life alert? No  Use of a cane, walker or w/c? No  Grab bars in the bathroom? No  Shower chair or bench in shower? No  Elevated toilet seat or a handicapped toilet? No   DME  ORDERS:  DME order needed?  No   TIMED UP AND GO:  Was the test performed? No . Telephonic visit.   Education: Fall risk prevention has been discussed.  Intervention(s) required? No   Depression Screen PHQ 2/9 Scores 08/31/2019 08/16/2019 02/14/2019 10/20/2018  PHQ - 2 Score 1 1 0 0  PHQ- 9 Score 2 3 6  -     Cognitive Function - 6CIT deferred for 2021 AWV, pt has no memory issues.      6CIT Screen 03/08/2018 02/16/2017  What Year? 0 points 0 points  What month? 0 points 0 points  What time? 0 points 0 points  Count back from 20 0 points 0 points  Months in reverse 2 points 0 points  Repeat phrase 0 points 0 points  Total Score 2 0    Immunization History  Administered Date(s) Administered  . Influenza Split 04/18/2011, 05/24/2012  . Influenza, High Dose Seasonal PF 07/03/2017, 04/29/2018  . Influenza,inj,Quad PF,6+ Mos 05/02/2013, 05/03/2014, 05/09/2015  . Influenza,inj,quad, With Preservative 07/18/2016  . Influenza-Unspecified 04/18/2017, 05/03/2018, 06/06/2019  . Pneumococcal Conjugate-13 10/10/2016  . Pneumococcal Polysaccharide-23 08/07/2017  . Td 08/04/2003  . Tdap 08/04/2003, 04/18/2011  . Zoster 01/12/2013  . Zoster Recombinat (Shingrix) 04/29/2018, 07/29/2018    Qualifies for Shingles Vaccine? Shingrix series complete.   Tdap: Up to date  Flu Vaccine: Up to date  Pneumococcal Vaccine: Up to date   Screening Tests Health Maintenance  Topic Date Due  . OPHTHALMOLOGY EXAM  09/07/2019 (Originally 04/09/2019)  . MAMMOGRAM  09/09/2019  . HEMOGLOBIN A1C  02/05/2020  . FOOT EXAM  02/14/2020  . TETANUS/TDAP  04/17/2021  . COLONOSCOPY  07/22/2021  . INFLUENZA VACCINE  Completed  . DEXA SCAN  Completed  . PNA vac Low Risk Adult  Completed  . Hepatitis C Screening  Addressed    Cancer Screenings:  Colorectal Screening: Completed 07/23/11. Repeat every 10 years;  Mammogram: Completed 09/08/18. Repeat every year. Ordered today. Pt provided with contact  information and advised to call to schedule appt.   Bone Density: Completed 11/03/16. Results reflect  OSTEOPENIA. Repeat every 2 years. Ordered today. Pt provided with contact information and advised to call to schedule appt.   Lung Cancer Screening: (Low Dose CT Chest recommended if Age 75-80 years, 30 pack-year currently smoking OR have quit w/in 15years.) does not qualify.    Additional Screening:  Hepatitis C Screening: does qualify; Completed 12/26/15  Vision Screening: Recommended annual ophthalmology exams for early detection of glaucoma and other disorders of the eye. Is the patient up to date with their annual eye exam?  No  Who is the provider or what is the name of the office in which the pt attends annual eye exams? [place] Screening: Recommended annual dental exams for proper oral hygiene  Community Resource Referral:  CRR required this visit?  No      Plan:    I have personally reviewed and addressed the Medicare Annual Wellness questionnaire and have noted the following in the patient's chart:  A. Medical and social history B. Use of alcohol, tobacco or illicit drugs  C. Current medications and supplements D. Functional ability and status E.  Nutritional status F.  Physical activity G. Advance directives H. List of other physicians I.  Hospitalizations, surgeries, and ER visits in previous 12 months J.  [place] such as hearing and vision if needed, cognitive and depression L. Referrals and appointments   In addition, I have reviewed and  discussed with patient certain preventive protocols, quality metrics, and best practice recommendations. A written personalized care plan for preventive services as well as general preventive health recommendations were provided to patient.   Signed,  Jenna Marker, LPN Nurse Health Advisor   Nurse Notes: none

## 2019-10-07 ENCOUNTER — Encounter: Payer: Self-pay | Admitting: Internal Medicine

## 2019-10-07 ENCOUNTER — Ambulatory Visit (INDEPENDENT_AMBULATORY_CARE_PROVIDER_SITE_OTHER): Payer: Medicare Other | Admitting: Internal Medicine

## 2019-10-07 ENCOUNTER — Other Ambulatory Visit: Payer: Self-pay

## 2019-10-07 VITALS — BP 130/82 | HR 67 | Temp 96.6°F | Ht 64.0 in | Wt 143.0 lb

## 2019-10-07 DIAGNOSIS — Z1231 Encounter for screening mammogram for malignant neoplasm of breast: Secondary | ICD-10-CM

## 2019-10-07 DIAGNOSIS — E785 Hyperlipidemia, unspecified: Secondary | ICD-10-CM | POA: Diagnosis not present

## 2019-10-07 DIAGNOSIS — F324 Major depressive disorder, single episode, in partial remission: Secondary | ICD-10-CM

## 2019-10-07 DIAGNOSIS — Z0000 Encounter for general adult medical examination without abnormal findings: Secondary | ICD-10-CM

## 2019-10-07 DIAGNOSIS — K219 Gastro-esophageal reflux disease without esophagitis: Secondary | ICD-10-CM | POA: Diagnosis not present

## 2019-10-07 DIAGNOSIS — H109 Unspecified conjunctivitis: Secondary | ICD-10-CM | POA: Diagnosis not present

## 2019-10-07 DIAGNOSIS — F5101 Primary insomnia: Secondary | ICD-10-CM | POA: Diagnosis not present

## 2019-10-07 DIAGNOSIS — A6000 Herpesviral infection of urogenital system, unspecified: Secondary | ICD-10-CM

## 2019-10-07 DIAGNOSIS — E1169 Type 2 diabetes mellitus with other specified complication: Secondary | ICD-10-CM | POA: Diagnosis not present

## 2019-10-07 DIAGNOSIS — I10 Essential (primary) hypertension: Secondary | ICD-10-CM

## 2019-10-07 DIAGNOSIS — E118 Type 2 diabetes mellitus with unspecified complications: Secondary | ICD-10-CM

## 2019-10-07 LAB — POCT URINALYSIS DIPSTICK
Bilirubin, UA: NEGATIVE
Blood, UA: NEGATIVE
Glucose, UA: NEGATIVE
Ketones, UA: NEGATIVE
Leukocytes, UA: NEGATIVE
Nitrite, UA: NEGATIVE
Protein, UA: NEGATIVE
Spec Grav, UA: 1.015 (ref 1.010–1.025)
Urobilinogen, UA: 0.2 E.U./dL
pH, UA: 6 (ref 5.0–8.0)

## 2019-10-07 MED ORDER — ATENOLOL 50 MG PO TABS: 50.0000 mg | ORAL_TABLET | Freq: Every day | ORAL | 1 refills | Status: DC

## 2019-10-07 MED ORDER — VALSARTAN 160 MG PO TABS: 160.0000 mg | ORAL_TABLET | Freq: Every day | ORAL | 1 refills | Status: DC

## 2019-10-07 MED ORDER — NEOMYCIN-POLYMYXIN-DEXAMETH 3.5-10000-0.1 OP SUSP: 2.0000 [drp] | Freq: Four times a day (QID) | OPHTHALMIC | 0 refills | Status: AC

## 2019-10-07 MED ORDER — ATORVASTATIN CALCIUM 10 MG PO TABS: 10.0000 mg | ORAL_TABLET | Freq: Every day | ORAL | 1 refills | Status: DC

## 2019-10-07 MED ORDER — ESTAZOLAM 2 MG PO TABS: 2.0000 mg | ORAL_TABLET | Freq: Every day | ORAL | 1 refills | Status: DC

## 2019-10-07 MED ORDER — ESTAZOLAM 2 MG PO TABS: 2.0000 mg | ORAL_TABLET | Freq: Every day | ORAL | 5 refills | Status: DC

## 2019-10-07 MED ORDER — PAROXETINE HCL 20 MG PO TABS: 20.0000 mg | ORAL_TABLET | Freq: Every day | ORAL | 1 refills | Status: DC

## 2019-10-07 MED ORDER — METFORMIN HCL 1000 MG PO TABS: 1000.0000 mg | ORAL_TABLET | Freq: Two times a day (BID) | ORAL | 1 refills | Status: DC

## 2019-10-07 MED ORDER — SITAGLIPTIN PHOSPHATE 100 MG PO TABS: 100.0000 mg | ORAL_TABLET | Freq: Every day | ORAL | 1 refills | Status: DC

## 2019-10-07 MED ORDER — ACYCLOVIR 200 MG PO CAPS: 200.0000 mg | ORAL_CAPSULE | Freq: Every day | ORAL | 1 refills | Status: DC

## 2019-10-07 NOTE — Progress Notes (Signed)
Date:  10/07/2019   Name:  Jenna Stein   DOB:  [birth year]   MRN:  [id number]   Chief Complaint: Annual Exam (Breast Exam. No pap. ), Hypertension (Blood pressure running high consistantly. Was in [place] for 6 months up until Feb 4th. Started about 3-4 months ago. When she came home - blood pressure dropped. Now she takes half of 320 mg tabs of Valsartan.), and Facial Swelling (Eye swollen and rash started in the last few weeks. ) Jenna Stein is a 69 y.o. female who presents today for her Complete Annual Exam. She feels fairly well. She reports exercising some. She reports she is sleeping fairly well.   Colonoscopy 2012 DEXA  10/2016 Mammogram  08/2018 Immunization History  Administered Date(s) Administered  . Influenza Split 04/18/2011, 05/24/2012  . Influenza, High Dose Seasonal PF 07/03/2017, 04/29/2018  . Influenza,inj,Quad PF,6+ Mos 05/02/2013, 05/03/2014, 05/09/2015  . Influenza,inj,quad, With Preservative 07/18/2016  . Influenza-Unspecified 04/18/2017, 05/03/2018, 06/06/2019  . PFIZER SARS-COV-2 Vaccination 09/27/2019  . Pneumococcal Conjugate-13 10/10/2016  . Pneumococcal Polysaccharide-23 08/07/2017  . Td 08/04/2003  . Tdap 08/04/2003, 04/18/2011  . Zoster 01/12/2013  . Zoster Recombinat (Shingrix) 04/29/2018, 07/29/2018    Hypertension This is a chronic problem. The problem has been gradually improving (bp was high when in [place] and had to double medication; now home for several weeks and BP is much better - able to cut back to 160 mg) since onset. The problem is controlled. Pertinent negatives include no chest pain, headaches, palpitations or shortness of breath. Past treatments include diuretics, angiotensin blockers and beta blockers (taking only half valsartan 160 mg). The current treatment provides significant improvement.  Diabetes She presents for her follow-up diabetic visit. She has type 2 diabetes mellitus. Pertinent negatives for hypoglycemia include no  dizziness, headaches, nervousness/anxiousness or tremors. Pertinent negatives for diabetes include no chest pain, no fatigue, no polydipsia and no polyuria. Symptoms are stable. Current diabetic treatment includes oral agent (dual therapy) Celesta Gentile and metformin). She is compliant with treatment all of the time. Her weight is stable. She is following a generally healthy diet. She monitors blood glucose at home 1-2 x per day. Blood glucose monitoring compliance is good. An ACE inhibitor/angiotensin II receptor blocker is being taken.  Hyperlipidemia This is a chronic problem. The problem is controlled. Pertinent negatives include no chest pain or shortness of breath. Current antihyperlipidemic treatment includes diet change and statins. The current treatment provides significant improvement of lipids.  Depression        This is a chronic problem.The problem is unchanged.  Associated symptoms include no fatigue and no headaches.  Past treatments include SSRIs - Selective serotonin reuptake inhibitors.  Compliance with treatment is good. Gastroesophageal Reflux She complains of heartburn. She reports no abdominal pain, no chest pain, no coughing or no wheezing. This is a recurrent problem. The problem occurs rarely. Pertinent negatives include no fatigue. She has tried a PPI for the symptoms.  Eye Problem  The left eye is affected. This is a new problem. The current episode started in the past 7 days. The problem occurs constantly. There was no injury mechanism. The patient is experiencing no pain. There is no known exposure to pink eye. Associated symptoms include eye redness (and swelling  with drainage and swelling). Pertinent negatives include no fever or vomiting. Associated symptoms comments: Mild local swelling.    Lab Results  Component Value Date   CREATININE 0.85 02/14/2019   BUN 12 02/14/2019  NA 137 02/14/2019   K 4.1 02/14/2019   CL 100 02/14/2019   CO2 23 02/14/2019   Lab Results    Component Value Date   CHOL 161 08/13/2018   HDL 50 08/13/2018   LDLCALC 79 08/13/2018   TRIG 158 (H) 08/13/2018   CHOLHDL 3.2 08/13/2018   Lab Results  Component Value Date   TSH 2.410 08/13/2018   Lab Results  Component Value Date   HGBA1C 6.5 08/07/2019     Review of Systems  Constitutional: Negative for chills, fatigue and fever.  HENT: Negative for congestion, hearing loss, tinnitus, trouble swallowing and voice change.   Eyes: Positive for redness (and swelling  with drainage and swelling). Negative for visual disturbance.  Respiratory: Negative for cough, chest tightness, shortness of breath and wheezing.   Cardiovascular: Negative for chest pain, palpitations and leg swelling.  Gastrointestinal: Positive for heartburn. Negative for abdominal pain, constipation, diarrhea and vomiting.  Endocrine: Negative for polydipsia and polyuria.  Genitourinary: Negative for dysuria, frequency, genital sores, vaginal bleeding and vaginal discharge.  Musculoskeletal: Negative for arthralgias, gait problem and joint swelling.  Skin: Negative for color change and rash.  Neurological: Negative for dizziness, tremors, light-headedness and headaches.  Hematological: Negative for adenopathy. Does not bruise/bleed easily.  Psychiatric/Behavioral: Positive for depression and sleep disturbance (trouble going to sleep - using med from [place] which helps). Negative for dysphoric mood. The patient is not nervous/anxious.     Patient Active Problem List   Diagnosis Date Noted  . Gastroesophageal reflux disease 08/13/2018  . Slow transit constipation 08/13/2018  . Type II diabetes mellitus with complication ([place]) [id number]  . Dry mouth 10/10/2016  . Ovarian failure 10/10/2016  . Allergic rhinitis, seasonal 04/30/2015  . Hepatic steatosis 04/30/2015  . Osteoarthritis of both hands 04/30/2015  . Genital herpes 04/04/2009  . Depression, major, single episode, in partial remission ([place])  08/18/1998  . Essential hypertension 08/18/1998  . Hyperlipidemia associated with type 2 diabetes mellitus ([place]) 08/18/1998  . Primary insomnia 08/18/1998    Allergies  Allergen Reactions  . Meloxicam Other (See Comments)  . Clonazepam Other (See Comments)    Nocturnal enuresis; Lethargy   . Trazodone Anxiety and Other (See Comments)    Dizziness     Past Surgical History:  Procedure Laterality Date  . CYST EXCISION Left 2017   foot  . HAND SURGERY  07/2012    Social History   Tobacco Use  . Smoking status: Never Smoker  . Smokeless tobacco: Never Used  . Tobacco comment: smoking cessation materials not required  Substance Use Topics  . Alcohol use: No  . Drug use: No     Medication list has been reviewed and updated.  Current Meds  Medication Sig  . acyclovir (ZOVIRAX) 200 MG capsule Take 1 capsule (200 mg total) by mouth 5 (five) times daily.  [name] atenolol (TENORMIN) 50 MG tablet Take 1 tablet (50 mg total) by mouth daily.  [name] atorvastatin (LIPITOR) 10 MG tablet Take 1 tablet (10 mg total) by mouth at bedtime.  . Bilberry 100 MG CAPS Take by mouth.  . celecoxib (CELEBREX) 200 MG capsule TAKE 1 CAPSULE BY MOUTH EVERY DAY  . Coenzyme Q10 (CO Q 10) 100 MG CAPS Take 2 capsules by mouth daily.   . Continuous Blood Gluc Receiver (FREESTYLE LIBRE 2 READER SYSTM) DEVI 1 each by Does not apply route as needed. Use to test Blood Sugar.  . Continuous Blood Gluc Sensor (FREESTYLE LIBRE 2 SENSOR SYSTM)  MISC Apply 1 each topically every 14 (fourteen) days.  [name] glucose blood (FREESTYLE LITE) test strip Use to test BS twice daily  . hydrochlorothiazide (HYDRODIURIL) 12.5 MG tablet Take 1 tablet (12.5 mg total) by mouth daily.  . Lancets (FREESTYLE) lancets Use to test BS once daily  . linaclotide (LINZESS) 72 MCG capsule Take 1 capsule (72 mcg total) by mouth daily before breakfast. (Patient taking differently: Take 72 mcg by mouth daily before breakfast. As Needed.)  . Lutein 20 MG  TABS Take 1 tablet by mouth daily.   . metFORMIN (GLUCOPHAGE) 1000 MG tablet Take 1 tablet (1,000 mg total) by mouth 2 (two) times daily with a meal.  . milk thistle 175 MG tablet Take 175 mg by mouth daily.  . MULTIPLE VITAMIN PO 1 tablet daily.   [name] olopatadine (PATANOL) 0.1 % ophthalmic solution Apply to eye.  . Omega-3 Fatty Acids (FISH OIL PEARLS PO) Take by mouth.  . Omeprazole 20 MG TBEC Take 1 tablet (20 mg total) by mouth daily.  [name] PARoxetine (PAXIL) 20 MG tablet Take 1 tablet (20 mg total) by mouth daily.  . sitaGLIPtin (JANUVIA) 100 MG tablet Take 1 tablet (100 mg total) by mouth daily.  . TURMERIC PO Take 800 mg by mouth.  . valsartan (DIOVAN) 320 MG tablet Take 1 tablet (320 mg total) by mouth daily. (Patient taking differently: Take 320 mg by mouth daily. 1/2 tablet daily.)  . vitamin E (VITAMIN E) 400 UNIT capsule Take 1 capsule (400 Units total) by mouth daily.  . zaleplon (SONATA) 10 MG capsule Take 1 capsule (10 mg total) by mouth at bedtime as needed for sleep.    PHQ 2/9 Scores 10/07/2019 08/31/2019 08/16/2019 02/14/2019  PHQ - 2 Score 0 1 1 0  PHQ- 9 Score 2 2 3 6     BP Readings from Last 3 Encounters:  10/07/19 130/82  08/31/19 (!) 153/81  08/16/19 (!) 156/84    Physical Exam Vitals and nursing note reviewed.  Constitutional:      General: She is not in acute distress.    Appearance: She is well-developed.  HENT:     Head: Normocephalic and atraumatic.     Right Ear: Tympanic membrane and ear canal normal.     Left Ear: Tympanic membrane and ear canal normal.     Nose:     Right Sinus: No maxillary sinus tenderness.     Left Sinus: No maxillary sinus tenderness.  Eyes:     General: No scleral icterus.       Right eye: No discharge.        Left eye: No discharge.     Extraocular Movements: Extraocular movements intact.     Conjunctiva/sclera: Conjunctivae normal.     Comments: Slight rash at left lateral epicanthic fold Puffy skin above and below the left  eye - c/w local fluid accumulation  Neck:     Thyroid: No thyromegaly.     Vascular: No carotid bruit.  Cardiovascular:     Rate and Rhythm: Normal rate and regular rhythm.     Pulses: Normal pulses.     Heart sounds: Normal heart sounds.  Pulmonary:     Effort: Pulmonary effort is normal. No respiratory distress.     Breath sounds: No wheezing.  Chest:     Breasts:        Right: No mass, nipple discharge, skin change or tenderness.        Left: No mass, nipple discharge, skin  change or tenderness.  Abdominal:     General: Bowel sounds are normal.     Palpations: Abdomen is soft.     Tenderness: There is no abdominal tenderness.  Musculoskeletal:        General: Normal range of motion.     Cervical back: Normal range of motion. No erythema.  Lymphadenopathy:     Cervical: No cervical adenopathy.  Skin:    General: Skin is warm and dry.     Findings: No rash.  Neurological:     Mental Status: She is alert and oriented to person, place, and time.     Cranial Nerves: No cranial nerve deficit.     Sensory: Sensation is intact. No sensory deficit.     Motor: Motor function is intact.     Deep Tendon Reflexes: Reflexes are normal and symmetric.  Psychiatric:        Attention and Perception: Attention normal.        Mood and Affect: Mood normal.        Speech: Speech normal.        Behavior: Behavior normal.        Thought Content: Thought content normal.        Cognition and Memory: Cognition normal.     Wt Readings from Last 3 Encounters:  10/07/19 143 lb (64.9 kg)  08/31/19 143 lb (64.9 kg)  08/16/19 143 lb (64.9 kg)    BP 130/82   Pulse 67   Temp (!) 96.6 F (35.9 C) (Temporal)   Ht 5\' 4"  (1.626 m)   Wt 143 lb (64.9 kg)   SpO2 98%   BMI 24.55 kg/m   Assessment and Plan: 1. Annual physical exam Normal exam  2. Encounter for screening mammogram for breast cancer Pt will schedule at [facility]; Future  3. Essential  hypertension Clinically stable exam with well controlled BP on valsartan 160 mg per day and atenolol. Tolerating medications without side effects at this time. Pt to continue current regimen and low sodium diet; benefits of regular exercise as able discussed. - CBC with Differential/Platelet - POCT urinalysis dipstick - atenolol (TENORMIN) 50 MG tablet; Take 1 tablet (50 mg total) by mouth daily.  Dispense: 90 tablet; Refill: 1 - valsartan (DIOVAN) 160 MG tablet; Take 1 tablet (160 mg total) by mouth daily.  Dispense: 90 tablet; Refill: 1  4. Type II diabetes mellitus with complication (HCC) Clinically stable by exam and report without s/s of hypoglycemia. DM complicated by HTN and lipids. Tolerating medications - metformin and januvia -  well without side effects or other concerns. - Comprehensive metabolic panel - Hemoglobin A1c - metFORMIN (GLUCOPHAGE) 1000 MG tablet; Take 1 tablet (1,000 mg total) by mouth 2 (two) times daily with a meal.  Dispense: 180 tablet; Refill: 1 - sitaGLIPtin (JANUVIA) 100 MG tablet; Take 1 tablet (100 mg total) by mouth daily.  Dispense: 90 tablet; Refill: 1  5. Hyperlipidemia associated with type 2 diabetes mellitus ([place]) Tolerating statin medication without side effects at this time LDL is almost at goal of < 70 on current dose Continue same therapy without change at this time. - Lipid panel - atorvastatin (LIPITOR) 10 MG tablet; Take 1 tablet (10 mg total) by mouth at bedtime.  Dispense: 90 tablet; Refill: 1  6. Depression, major, single episode, in partial remission (HCC) Clinically stable on current regimen with good control of symptoms, No SI or HI. Will continue current therapy with Paxil. -  TSH - PARoxetine (PAXIL) 20 MG tablet; Take 1 tablet (20 mg total) by mouth daily.  Dispense: 90 tablet; Refill: 1  7. Primary insomnia Delayed sleep onset with Sonata; has used prosom from [place] with good results Will stop sonata and begin Prosom -  estazolam (PROSOM) 2 MG tablet; Take 1 tablet (2 mg total) by mouth at bedtime.  Dispense: 30 tablet; Refill: 5  8. Gastroesophageal reflux disease, unspecified whether esophagitis present Symptoms well controlled on daily PPI No red flag signs such as weight loss, n/v, melena Will continue omeprazole. - CBC with Differential/Platelet  9. Conjunctivitis of left eye, unspecified conjunctivitis type With mild dependent edema this am - recommend cool compresses Use eye drops If persistent - consult Ophthalmology - neomycin-polymyxin b-dexamethasone (MAXITROL) 3.5-10000-0.1 SUSP; Place 2 drops into both eyes every 6 (six) hours for 10 days.  Dispense: 5 mL; Refill: 0  10. Genital herpes simplex, unspecified site Using acyclovir PRN to suppress episodic flares - acyclovir (ZOVIRAX) 200 MG capsule; Take 1 capsule (200 mg total) by mouth 5 (five) times daily.  Dispense: 90 capsule; Refill: 1   Partially dictated using Editor, commissioning. Any errors are unintentional.  Halina Maidens, MD [facility]  10/07/2019

## 2019-10-08 LAB — COMPREHENSIVE METABOLIC PANEL
ALT: 17 IU/L (ref 0–32)
AST: 19 IU/L (ref 0–40)
Albumin/Globulin Ratio: 1.7 (ref 1.2–2.2)
Albumin: 4.7 g/dL (ref 3.8–4.8)
Alkaline Phosphatase: 59 IU/L (ref 39–117)
BUN/Creatinine Ratio: 14 (ref 12–28)
BUN: 12 mg/dL (ref 8–27)
Bilirubin Total: 0.6 mg/dL (ref 0.0–1.2)
CO2: 23 mmol/L (ref 20–29)
Calcium: 10.4 mg/dL — ABNORMAL HIGH (ref 8.7–10.3)
Chloride: 99 mmol/L (ref 96–106)
Creatinine, Ser: 0.85 mg/dL (ref 0.57–1.00)
GFR calc Af Amer: 81 mL/min/{1.73_m2} (ref >59)
GFR calc non Af Amer: 71 mL/min/{1.73_m2} (ref >59)
Globulin, Total: 2.7 g/dL (ref 1.5–4.5)
Glucose: 117 mg/dL — ABNORMAL HIGH (ref 65–99)
Potassium: 4 mmol/L (ref 3.5–5.2)
Sodium: 140 mmol/L (ref 134–144)
Total Protein: 7.4 g/dL (ref 6.0–8.5)

## 2019-10-08 LAB — LIPID PANEL
Chol/HDL Ratio: 3.3 ratio (ref 0.0–4.4)
Cholesterol, Total: 174 mg/dL (ref 100–199)
HDL: 53 mg/dL (ref >39)
LDL Chol Calc (NIH): 90 mg/dL (ref 0–99)
Triglycerides: 184 mg/dL — ABNORMAL HIGH (ref 0–149)
VLDL Cholesterol Cal: 31 mg/dL (ref 5–40)

## 2019-10-08 LAB — CBC WITH DIFFERENTIAL/PLATELET
Basophils Absolute: 0.4 10*3/uL — ABNORMAL HIGH (ref 0.0–0.2)
Basos: 5 %
EOS (ABSOLUTE): 0.3 10*3/uL (ref 0.0–0.4)
Eos: 4 %
Hematocrit: 41.1 % (ref 34.0–46.6)
Hemoglobin: 14.1 g/dL (ref 11.1–15.9)
Immature Grans (Abs): 0.2 10*3/uL — ABNORMAL HIGH (ref 0.0–0.1)
Immature Granulocytes: 2 %
Lymphocytes Absolute: 2.8 10*3/uL (ref 0.7–3.1)
Lymphs: 30 %
MCH: 30.5 pg (ref 26.6–33.0)
MCHC: 34.3 g/dL (ref 31.5–35.7)
MCV: 89 fL (ref 79–97)
Monocytes Absolute: 0.6 10*3/uL (ref 0.1–0.9)
Monocytes: 6 %
Neutrophils Absolute: 5 10*3/uL (ref 1.4–7.0)
Neutrophils: 53 %
Platelets: 317 10*3/uL (ref 150–450)
RBC: 4.62 x10E6/uL (ref 3.77–5.28)
RDW: 13.4 % (ref 11.7–15.4)
WBC: 9.3 10*3/uL (ref 3.4–10.8)

## 2019-10-08 LAB — HEMOGLOBIN A1C
Est. average glucose Bld gHb Est-mCnc: 117 mg/dL
Hgb A1c MFr Bld: 5.7 % — ABNORMAL HIGH (ref 4.8–5.6)

## 2019-10-08 LAB — TSH: TSH: 2.74 u[IU]/mL (ref 0.450–4.500)

## 2019-11-27 ENCOUNTER — Encounter: Payer: Self-pay | Admitting: Internal Medicine

## 2019-12-05 ENCOUNTER — Other Ambulatory Visit: Payer: Self-pay

## 2019-12-05 ENCOUNTER — Ambulatory Visit
Admission: RE | Admit: 2019-12-05 | Discharge: 2019-12-05 | Disposition: A | Discharge status: Home / Self Care | Payer: Medicare Other | Source: Ambulatory Visit | Attending: Internal Medicine | Admitting: Internal Medicine

## 2019-12-05 DIAGNOSIS — Z1231 Encounter for screening mammogram for malignant neoplasm of breast: Secondary | ICD-10-CM | POA: Insufficient documentation

## 2019-12-06 DIAGNOSIS — E119 Type 2 diabetes mellitus without complications: Secondary | ICD-10-CM | POA: Diagnosis not present

## 2019-12-06 LAB — HM DIABETES EYE EXAM

## 2020-02-01 ENCOUNTER — Other Ambulatory Visit: Payer: Self-pay

## 2020-02-01 ENCOUNTER — Encounter: Payer: Self-pay | Admitting: Internal Medicine

## 2020-02-01 ENCOUNTER — Ambulatory Visit (INDEPENDENT_AMBULATORY_CARE_PROVIDER_SITE_OTHER): Payer: Medicare Other | Admitting: Internal Medicine

## 2020-02-01 VITALS — BP 120/68 | HR 72 | Temp 97.9°F | Ht 64.5 in | Wt 147.0 lb

## 2020-02-01 DIAGNOSIS — K219 Gastro-esophageal reflux disease without esophagitis: Secondary | ICD-10-CM

## 2020-02-01 DIAGNOSIS — R21 Rash and other nonspecific skin eruption: Secondary | ICD-10-CM

## 2020-02-01 DIAGNOSIS — F5101 Primary insomnia: Secondary | ICD-10-CM

## 2020-02-01 DIAGNOSIS — E785 Hyperlipidemia, unspecified: Secondary | ICD-10-CM

## 2020-02-01 DIAGNOSIS — E118 Type 2 diabetes mellitus with unspecified complications: Secondary | ICD-10-CM

## 2020-02-01 DIAGNOSIS — F324 Major depressive disorder, single episode, in partial remission: Secondary | ICD-10-CM | POA: Diagnosis not present

## 2020-02-01 DIAGNOSIS — E1169 Type 2 diabetes mellitus with other specified complication: Secondary | ICD-10-CM

## 2020-02-01 DIAGNOSIS — I10 Essential (primary) hypertension: Secondary | ICD-10-CM

## 2020-02-01 DIAGNOSIS — A6000 Herpesviral infection of urogenital system, unspecified: Secondary | ICD-10-CM

## 2020-02-01 MED ORDER — FREESTYLE LITE TEST VI STRP: ORAL_STRIP | 12 refills | Status: DC

## 2020-02-01 MED ORDER — METFORMIN HCL 1000 MG PO TABS: 1000.0000 mg | ORAL_TABLET | Freq: Two times a day (BID) | ORAL | 1 refills | Status: DC

## 2020-02-01 MED ORDER — PAROXETINE HCL 20 MG PO TABS: 20.0000 mg | ORAL_TABLET | Freq: Every day | ORAL | 1 refills | Status: DC

## 2020-02-01 MED ORDER — VALSARTAN 160 MG PO TABS: 160.0000 mg | ORAL_TABLET | Freq: Every day | ORAL | 1 refills | Status: DC

## 2020-02-01 MED ORDER — HYDROCHLOROTHIAZIDE 12.5 MG PO TABS: 12.5000 mg | ORAL_TABLET | Freq: Every day | ORAL | 1 refills | Status: DC

## 2020-02-01 MED ORDER — ATENOLOL 50 MG PO TABS: 50.0000 mg | ORAL_TABLET | Freq: Every day | ORAL | 1 refills | Status: DC

## 2020-02-01 MED ORDER — OMEPRAZOLE 20 MG PO CPDR: 20.0000 mg | DELAYED_RELEASE_CAPSULE | Freq: Every day | ORAL | 1 refills | Status: DC

## 2020-02-01 MED ORDER — ATORVASTATIN CALCIUM 10 MG PO TABS: 10.0000 mg | ORAL_TABLET | Freq: Every day | ORAL | 1 refills | Status: DC

## 2020-02-01 MED ORDER — SITAGLIPTIN PHOSPHATE 100 MG PO TABS: 100.0000 mg | ORAL_TABLET | Freq: Every day | ORAL | 1 refills | Status: DC

## 2020-02-01 MED ORDER — TRIAMCINOLONE ACETONIDE 0.5 % EX CREA: 1.0000 "application " | TOPICAL_CREAM | Freq: Three times a day (TID) | CUTANEOUS | 0 refills | Status: DC

## 2020-02-01 MED ORDER — ACYCLOVIR 200 MG PO CAPS: 200.0000 mg | ORAL_CAPSULE | Freq: Every day | ORAL | 1 refills | Status: DC

## 2020-02-01 NOTE — Progress Notes (Signed)
Date:  02/01/2020   Name:  Jenna Stein   DOB:  01-12-1951   MRN:  [id number]   Chief Complaint: Diabetes (Follow up.), Hypertension, and Rash (On left side - X 2 weeks. Itching. Not painful. ) CPX done on 09/2019.  Diabetes She presents for her follow-up diabetic visit. She has type 2 diabetes mellitus. Her disease course has been stable. Pertinent negatives for hypoglycemia include no headaches or tremors. Pertinent negatives for diabetes include no blurred vision, no chest pain, no fatigue, no foot paresthesias, no foot ulcerations, no polydipsia, no polyuria, no visual change and no weight loss. Symptoms are stable. There are no diabetic complications. Current diabetic treatment includes oral agent (dual therapy). She is compliant with treatment all of the time. She monitors blood glucose at home 1-2 x per day. There is no change in her home blood glucose trend. Her breakfast blood glucose is taken between 6-7 am. Her breakfast blood glucose range is generally 90-110 mg/dl. An ACE inhibitor/angiotensin II receptor blocker is being taken. Eye exam is current.  Hypertension The problem is controlled. Pertinent negatives include no blurred vision, chest pain, headaches, palpitations or shortness of breath. Past treatments include diuretics, beta blockers and angiotensin blockers. The current treatment provides significant improvement.  Hyperlipidemia The problem is controlled. Pertinent negatives include no chest pain or shortness of breath. Current antihyperlipidemic treatment includes statins. The current treatment provides significant improvement of lipids.  Insomnia Primary symptoms: sleep disturbance, difficulty falling asleep, premature morning awakening.  The problem occurs nightly. The problem is unchanged. Past treatments include medication (takes 1/2 Prosom and Sonata 10 mg). The treatment provided significant relief. Prior diagnostic workup includes:  Blood work.  Rash This is a new  problem. The current episode started 1 to 4 weeks ago. The problem is unchanged. The affected locations include the abdomen. The rash is characterized by itchiness and redness. Associated with: possibly new pair of pants with elastic waist. Pertinent negatives include no cough, fatigue, fever or shortness of breath. Past treatments include anti-itch cream. The treatment provided mild relief.    Lab Results  Component Value Date   CREATININE 0.85 10/07/2019   BUN 12 10/07/2019   NA 140 10/07/2019   K 4.0 10/07/2019   CL 99 10/07/2019   CO2 23 10/07/2019   Lab Results  Component Value Date   CHOL 174 10/07/2019   HDL 53 10/07/2019   LDLCALC 90 10/07/2019   TRIG 184 (H) 10/07/2019   CHOLHDL 3.3 10/07/2019   Lab Results  Component Value Date   TSH 2.740 10/07/2019   Lab Results  Component Value Date   HGBA1C 5.7 (H) 10/07/2019   Lab Results  Component Value Date   WBC 9.3 10/07/2019   HGB 14.1 10/07/2019   HCT 41.1 10/07/2019   MCV 89 10/07/2019   PLT 317 10/07/2019   Lab Results  Component Value Date   ALT 17 10/07/2019   AST 19 10/07/2019   ALKPHOS 59 10/07/2019   BILITOT 0.6 10/07/2019     Review of Systems  Constitutional: Negative for appetite change, fatigue, fever, unexpected weight change and weight loss.  HENT: Negative for tinnitus and trouble swallowing.   Eyes: Negative for blurred vision and visual disturbance.  Respiratory: Negative for cough, chest tightness and shortness of breath.   Cardiovascular: Negative for chest pain, palpitations and leg swelling.  Gastrointestinal: Negative for abdominal pain.  Endocrine: Negative for polydipsia and polyuria.  Genitourinary: Negative for dysuria and hematuria.  Musculoskeletal: Negative for arthralgias.  Skin: Positive for rash.  Neurological: Negative for tremors, numbness and headaches.  Psychiatric/Behavioral: Positive for sleep disturbance. Negative for dysphoric mood. The patient has insomnia.      Patient Active Problem List   Diagnosis Date Noted  . Gastroesophageal reflux disease 08/13/2018  . Slow transit constipation 08/13/2018  . Type II diabetes mellitus with complication ([place]) 21/19/4174  . Dry mouth 10/10/2016  . Ovarian failure 10/10/2016  . Allergic rhinitis, seasonal 04/30/2015  . Hepatic steatosis 04/30/2015  . Osteoarthritis of both hands 04/30/2015  . Genital herpes 04/04/2009  . Depression, major, single episode, in partial remission ([place]) 08/18/1998  . Essential hypertension 08/18/1998  . Hyperlipidemia associated with type 2 diabetes mellitus ([place]) 08/18/1998  . Primary insomnia 08/18/1998    Allergies  Allergen Reactions  . Meloxicam Other (See Comments)  . Clonazepam Other (See Comments)    Nocturnal enuresis; Lethargy   . Trazodone Anxiety and Other (See Comments)    Dizziness     Past Surgical History:  Procedure Laterality Date  . CYST EXCISION Left 2017   foot  . HAND SURGERY  07/2012    Social History   Tobacco Use  . Smoking status: Never Smoker  . Smokeless tobacco: Never Used  . Tobacco comment: smoking cessation materials not required  Vaping Use  . Vaping Use: Never used  Substance Use Topics  . Alcohol use: No  . Drug use: No     Medication list has been reviewed and updated.  Current Meds  Medication Sig  . acyclovir (ZOVIRAX) 200 MG capsule Take 1 capsule (200 mg total) by mouth 5 (five) times daily.  [name] atenolol (TENORMIN) 50 MG tablet Take 1 tablet (50 mg total) by mouth daily.  [name] atorvastatin (LIPITOR) 10 MG tablet Take 1 tablet (10 mg total) by mouth at bedtime.  . Bilberry 100 MG CAPS Take by mouth.  . celecoxib (CELEBREX) 200 MG capsule TAKE 1 CAPSULE BY MOUTH EVERY DAY  . Coenzyme Q10 (CO Q 10) 100 MG CAPS Take 2 capsules by mouth daily.   . Continuous Blood Gluc Receiver (FREESTYLE LIBRE 2 READER SYSTM) DEVI 1 each by Does not apply route as needed. Use to test Blood Sugar.  . Continuous Blood Gluc Sensor  (FREESTYLE LIBRE 2 SENSOR SYSTM) MISC Apply 1 each topically every 14 (fourteen) days.  [name] estazolam (PROSOM) 2 MG tablet Take 1 tablet (2 mg total) by mouth at bedtime.  [name] glucose blood (FREESTYLE LITE) test strip Use to test BS twice daily  . hydrochlorothiazide (HYDRODIURIL) 12.5 MG tablet Take 1 tablet (12.5 mg total) by mouth daily.  . Lancets (FREESTYLE) lancets Use to test BS once daily  . Lutein 20 MG TABS Take 1 tablet by mouth daily.   . metFORMIN (GLUCOPHAGE) 1000 MG tablet Take 1 tablet (1,000 mg total) by mouth 2 (two) times daily with a meal.  . milk thistle 175 MG tablet Take 175 mg by mouth daily.  . MULTIPLE VITAMIN PO 1 tablet daily.   [name] olopatadine (PATANOL) 0.1 % ophthalmic solution Apply to eye.  . Omega-3 Fatty Acids (FISH OIL PEARLS PO) Take by mouth.  [name] omeprazole (PRILOSEC) 20 MG capsule Take 1 capsule (20 mg total) by mouth daily.  [name] PARoxetine (PAXIL) 20 MG tablet Take 1 tablet (20 mg total) by mouth daily.  . sitaGLIPtin (JANUVIA) 100 MG tablet Take 1 tablet (100 mg total) by mouth daily.  . TURMERIC PO Take 800 mg by mouth.  [name]  valsartan (DIOVAN) 160 MG tablet Take 1 tablet (160 mg total) by mouth daily.  . vitamin E (VITAMIN E) 400 UNIT capsule Take 1 capsule (400 Units total) by mouth daily.  . [DISCONTINUED] acyclovir (ZOVIRAX) 200 MG capsule Take 1 capsule (200 mg total) by mouth 5 (five) times daily.  . [DISCONTINUED] atenolol (TENORMIN) 50 MG tablet Take 1 tablet (50 mg total) by mouth daily.  . [DISCONTINUED] atorvastatin (LIPITOR) 10 MG tablet Take 1 tablet (10 mg total) by mouth at bedtime.  . [DISCONTINUED] glucose blood (FREESTYLE LITE) test strip Use to test BS twice daily  . [DISCONTINUED] hydrochlorothiazide (HYDRODIURIL) 12.5 MG tablet Take 1 tablet (12.5 mg total) by mouth daily.  . [DISCONTINUED] metFORMIN (GLUCOPHAGE) 1000 MG tablet Take 1 tablet (1,000 mg total) by mouth 2 (two) times daily with a meal.  . [DISCONTINUED] omeprazole (PRILOSEC) 20 MG  capsule Take 20 mg by mouth daily.  . [DISCONTINUED] PARoxetine (PAXIL) 20 MG tablet Take 1 tablet (20 mg total) by mouth daily.  . [DISCONTINUED] sitaGLIPtin (JANUVIA) 100 MG tablet Take 1 tablet (100 mg total) by mouth daily.  . [DISCONTINUED] valsartan (DIOVAN) 160 MG tablet Take 1 tablet (160 mg total) by mouth daily.    PHQ 2/9 Scores 02/01/2020 10/07/2019 08/31/2019 08/16/2019  PHQ - 2 Score 0 0 1 1  PHQ- 9 Score 0 2 2 3     GAD 7 : Generalized Anxiety Score 02/01/2020 08/16/2019  Nervous, Anxious, on Edge 1 0  Control/stop worrying 2 0  Worry too much - different things 2 2  Trouble relaxing 0 0  Restless 0 0  Easily annoyed or irritable 0 0  Afraid - awful might happen 3 1  Total GAD 7 Score 8 3  Anxiety Difficulty Not difficult at all Not difficult at all    BP Readings from Last 3 Encounters:  02/01/20 120/68  10/07/19 130/82  08/31/19 (!) 153/81    Physical Exam Vitals and nursing note reviewed.  Constitutional:      General: She is not in acute distress.    Appearance: Normal appearance. She is well-developed.  HENT:     Head: Normocephalic and atraumatic.  Neck:     Thyroid: No thyroid mass or thyromegaly.  Cardiovascular:     Rate and Rhythm: Normal rate and regular rhythm.     Pulses: Normal pulses.     Heart sounds: No murmur heard.   Pulmonary:     Effort: Pulmonary effort is normal. No respiratory distress.     Breath sounds: No wheezing or rhonchi.  Abdominal:     General: Abdomen is flat.     Palpations: Abdomen is soft.  Musculoskeletal:     Cervical back: Normal range of motion.     Right lower leg: No edema.     Left lower leg: No edema.  Lymphadenopathy:     Cervical: No cervical adenopathy.  Skin:    General: Skin is warm and dry.     Capillary Refill: Capillary refill takes less than 2 seconds.     Findings: Rash present.          Comments: Patch on left side at the waist line - confluent red raised rough rash  Neurological:      General: No focal deficit present.     Mental Status: She is alert and oriented to person, place, and time.  Psychiatric:        Attention and Perception: Attention normal.  Mood and Affect: Mood normal.     Wt Readings from Last 3 Encounters:  02/01/20 147 lb (66.7 kg)  10/07/19 143 lb (64.9 kg)  08/31/19 143 lb (64.9 kg)    BP 120/68 (BP Location: Right Arm, Patient Position: Sitting, Cuff Size: Normal)   Pulse 72   Temp 97.9 F (36.6 C) (Oral)   Ht 5' 4.5" (1.638 m)   Wt 147 lb (66.7 kg)   SpO2 96%   BMI 24.84 kg/m   Assessment and Plan: 1. Type II diabetes mellitus with complication (HCC) Clinically stable by exam and report without s/s of hypoglycemia. DM complicated by htn. Tolerating medications -  well without side effects or other concerns. - Hemoglobin A1c - metFORMIN (GLUCOPHAGE) 1000 MG tablet; Take 1 tablet (1,000 mg total) by mouth 2 (two) times daily with a meal.  Dispense: 180 tablet; Refill: 1 - sitaGLIPtin (JANUVIA) 100 MG tablet; Take 1 tablet (100 mg total) by mouth daily.  Dispense: 90 tablet; Refill: 1 - glucose blood (FREESTYLE LITE) test strip; Use to test BS twice daily  Dispense: 100 each; Refill: 12  2. Essential hypertension Clinically stable exam with well controlled BP. Tolerating medications without side effects at this time. Pt to continue current regimen and low sodium diet; benefits of regular exercise as able discussed. - atenolol (TENORMIN) 50 MG tablet; Take 1 tablet (50 mg total) by mouth daily.  Dispense: 90 tablet; Refill: 1 - hydrochlorothiazide (HYDRODIURIL) 12.5 MG tablet; Take 1 tablet (12.5 mg total) by mouth daily.  Dispense: 90 tablet; Refill: 1 - valsartan (DIOVAN) 160 MG tablet; Take 1 tablet (160 mg total) by mouth daily.  Dispense: 90 tablet; Refill: 1  3. Rash and nonspecific skin eruption - triamcinolone cream (KENALOG) 0.5 %; Apply 1 application topically 3 (three) times daily. To rash on abdomen  Dispense: 30 g;  Refill: 0  4. Hyperlipidemia associated with type 2 diabetes mellitus ([place]) Tolerating statin medication without side effects at this time LDL is at goal of < 70 on current dose Continue same therapy without change at this time. - atorvastatin (LIPITOR) 10 MG tablet; Take 1 tablet (10 mg total) by mouth at bedtime.  Dispense: 90 tablet; Refill: 1  5. Depression, major, single episode, in partial remission (HCC) Clinically stable on current regimen with good control of symptoms, No SI or HI. Will continue current therapy without change at this time. - PARoxetine (PAXIL) 20 MG tablet; Take 1 tablet (20 mg total) by mouth daily.  Dispense: 90 tablet; Refill: 1  6. Genital herpes simplex, unspecified site - acyclovir (ZOVIRAX) 200 MG capsule; Take 1 capsule (200 mg total) by mouth 5 (five) times daily.  Dispense: 90 capsule; Refill: 1  7. Gastroesophageal reflux disease without esophagitis Symptoms well controlled on daily PPI No red flag signs such as weight loss, n/v, melena Will continue omeprazole. - omeprazole (PRILOSEC) 20 MG capsule; Take 1 capsule (20 mg total) by mouth daily.  Dispense: 90 capsule; Refill: 1   Partially dictated using Editor, commissioning. Any errors are unintentional.  Halina Maidens, MD [facility]  02/01/2020

## 2020-02-02 LAB — HEMOGLOBIN A1C
Est. average glucose Bld gHb Est-mCnc: 128 mg/dL
Hgb A1c MFr Bld: 6.1 % — ABNORMAL HIGH (ref 4.8–5.6)

## 2020-02-07 ENCOUNTER — Ambulatory Visit: Payer: Medicare Other | Admitting: Internal Medicine

## 2020-04-03 ENCOUNTER — Other Ambulatory Visit: Payer: Self-pay | Admitting: Internal Medicine

## 2020-04-03 DIAGNOSIS — M19041 Primary osteoarthritis, right hand: Secondary | ICD-10-CM

## 2020-04-03 MED ORDER — CELECOXIB 200 MG PO CAPS: ORAL_CAPSULE | ORAL | 0 refills | Status: DC

## 2020-04-03 NOTE — Telephone Encounter (Signed)
Medication Refill - Medication: celecoxib (CELEBREX) 200 MG capsule [[id number]]  This medication is discontinued  zaleplon (SONATA) 10 MG capsule [[id number]] DISCONTINUED  Preferred Pharmacy (with phone number or street name): [place], [address]  [place] [address]  Phone: [phone] Fax: [phone]     Agent: Please be advised that RX refills may take up to 3 business days. We ask that you follow-up with your pharmacy.

## 2020-04-03 NOTE — Telephone Encounter (Signed)
Requested Prescriptions  Pending Prescriptions Disp Refills   celecoxib (CELEBREX) 200 MG capsule 90 capsule 0    Sig: TAKE 1 CAPSULE BY MOUTH EVERY DAY     Analgesics:  COX2 Inhibitors Passed - 04/03/2020  4:14 PM      Passed - HGB in normal range and within 360 days    Hemoglobin  Date Value Ref Range Status  10/07/2019 14.1 11.1 - 15.9 g/dL Final         Passed - Cr in normal range and within 360 days    Creatinine, Ser  Date Value Ref Range Status  10/07/2019 0.85 0.57 - 1.00 mg/dL Final         Passed - Patient is not pregnant      Passed - Valid encounter within last 12 months    Recent Outpatient Visits          2 months ago Type II diabetes mellitus with complication [facility])   [facility]an Hess, [facility]   5 months ago Essential hypertension   [facility] Glean Hess, MD   7 months ago Essential hypertension   [facility] Glean Hess, MD   1 year ago Type II diabetes mellitus with complication [facility])   [facility] Glean Hess, [facility]   1 year ago Neoplasm of uncertain behavior   [facility] Glean Hess, MD      Future Appointments            In 2 months Army Melia Jesse Sans, MD [facility], [place]   In 6 months Army Melia Jesse Sans, MD [facility], [facility]            Pt. Is requesting refill on Sonata 10 mg.  Sonata was d/c'd at office visit 10/07/19, and there was a change in therapy.  Noted at office visit 02/01/20, it was documented that pt. Takes "1/2 Prosom, and Sonata 10 mg.  Will route to PCP for recommendation on refill on Sonata.

## 2020-04-24 DIAGNOSIS — Z23 Encounter for immunization: Secondary | ICD-10-CM | POA: Diagnosis not present

## 2020-04-25 ENCOUNTER — Telehealth: Payer: Self-pay | Admitting: Internal Medicine

## 2020-04-25 NOTE — Telephone Encounter (Signed)
Copied from [place] [id number]. Topic: General - Other >> Apr 25, 2020  4:18 PM Keene Breath wrote: Reason for CRM: Patient called to ask if doctor would send a script for a medication that is not on her medication list, Zaleplon.  Please advise and call patient to discuss at [phone].

## 2020-04-27 ENCOUNTER — Telehealth: Payer: Self-pay

## 2020-04-27 NOTE — Chronic Care Management (AMB) (Signed)
  Chronic Care Management   Note  04/27/2020 Name: Jenna Stein MRN: [id number] DOB: [birth year]  Jenna Stein is a 69 y.o. year old female who is a primary care patient of Glean Hess, MD. I reached out to Jenna Stein by phone today in response to a referral sent by Jenna Stein's health plan.     Ms. Livolsi was given information about Chronic Care Management services today including:  1. CCM service includes personalized support from designated clinical staff supervised by her physician, including individualized plan of care and coordination with other care providers 2. 24/7 contact phone numbers for assistance for urgent and routine care needs. 3. Service will only be billed when office clinical staff spend 20 minutes or more in a month to coordinate care. 4. Only one practitioner may furnish and bill the service in a calendar month. 5. The patient may stop CCM services at any time (effective at the end of the month) by phone call to the office staff. 6. The patient will be responsible for cost sharing (co-pay) of up to 20% of the service fee (after annual deductible is met).  Patient did not agree to enrollment in care management services and does not wish to consider at this time.  Follow up plan: The patient has been provided with contact information for the care management team and has been advised to call with any health related questions or concerns.   Noreene Larsson, [place], [place], [place] [address] Direct Dial: [phone] [email or web address] Website: [email or web address]

## 2020-05-04 ENCOUNTER — Other Ambulatory Visit: Payer: Self-pay | Admitting: Internal Medicine

## 2020-05-04 DIAGNOSIS — Z20822 Contact with and (suspected) exposure to covid-19: Secondary | ICD-10-CM | POA: Diagnosis not present

## 2020-05-04 DIAGNOSIS — F5101 Primary insomnia: Secondary | ICD-10-CM

## 2020-05-04 NOTE — Telephone Encounter (Signed)
Requested medication (s) are due for refill today: Yes  Requested medication (s) are on the active medication list: Yes  Last refill:  10/07/19  Future visit scheduled: Yes  Notes to clinic:  See request.    Requested Prescriptions  Pending Prescriptions Disp Refills   estazolam (PROSOM) 2 MG tablet [Pharmacy Med Name: ESTAZOLAM 2 MG TABLET] 30 tablet     Sig: Take 1 tablet (2 mg total) by mouth at bedtime.      Not Delegated - Psychiatry:  Anxiolytics/Hypnotics Failed - 05/04/2020 12:00 PM      Failed - This refill cannot be delegated      Failed - Urine Drug Screen completed in last 360 days.      Passed - Valid encounter within last 6 months    Recent Outpatient Visits           3 months ago Type II diabetes mellitus with complication [facility])   [facility] Glean Hess, MD   7 months ago Essential hypertension   [place], [place]   8 months ago Essential hypertension   [facility] Glean Hess, MD   1 year ago Type II diabetes mellitus with complication [facility])   [facility] Glean Hess, MD   1 year ago Neoplasm of uncertain behavior   [facility] Glean Hess, MD       Future Appointments             In 1 month Army Melia Jesse Sans, MD [facility], [place]   In 5 months Army Melia Jesse Sans, MD [facility], [facility]

## 2020-05-07 NOTE — Telephone Encounter (Signed)
Please Advise. Last office visit 02/01/2020.  KP

## 2020-05-12 DIAGNOSIS — Z20822 Contact with and (suspected) exposure to covid-19: Secondary | ICD-10-CM | POA: Diagnosis not present

## 2020-06-04 ENCOUNTER — Encounter: Payer: Self-pay | Admitting: Internal Medicine

## 2020-06-04 ENCOUNTER — Other Ambulatory Visit: Payer: Self-pay

## 2020-06-04 ENCOUNTER — Ambulatory Visit (INDEPENDENT_AMBULATORY_CARE_PROVIDER_SITE_OTHER): Payer: Medicare Other | Admitting: Internal Medicine

## 2020-06-04 VITALS — BP 124/70 | HR 73 | Temp 98.0°F | Ht 64.5 in | Wt 142.0 lb

## 2020-06-04 DIAGNOSIS — E118 Type 2 diabetes mellitus with unspecified complications: Secondary | ICD-10-CM | POA: Diagnosis not present

## 2020-06-04 DIAGNOSIS — I10 Essential (primary) hypertension: Secondary | ICD-10-CM

## 2020-06-04 DIAGNOSIS — M19042 Primary osteoarthritis, left hand: Secondary | ICD-10-CM

## 2020-06-04 DIAGNOSIS — M19041 Primary osteoarthritis, right hand: Secondary | ICD-10-CM | POA: Diagnosis not present

## 2020-06-04 MED ORDER — HYDROCHLOROTHIAZIDE 12.5 MG PO TABS: 12.5000 mg | ORAL_TABLET | Freq: Every day | ORAL | 3 refills | Status: DC

## 2020-06-04 MED ORDER — CELECOXIB 200 MG PO CAPS: ORAL_CAPSULE | ORAL | 3 refills | Status: DC

## 2020-06-04 NOTE — Progress Notes (Signed)
Date:  06/04/2020   Name:  Jenna Stein   DOB:  12-19-1950   MRN:  [id number]   Chief Complaint: Diabetes (4 month follow up.) and Hypertension (4 month follow up. )  Diabetes She presents for her follow-up diabetic visit. She has type 2 diabetes mellitus. Her disease course has been stable. Pertinent negatives for hypoglycemia include no headaches or tremors. Pertinent negatives for diabetes include no chest pain, no fatigue, no polydipsia and no polyuria. Symptoms are stable. Current diabetic treatment includes oral agent (dual therapy) (metformin and januvia). She is compliant with treatment all of the time. She monitors blood glucose at home 1-2 x per day. Her breakfast blood glucose is taken between 6-7 am. Her breakfast blood glucose range is generally 110-130 mg/dl. An ACE inhibitor/angiotensin II receptor blocker is being taken.  Hypertension Pertinent negatives include no chest pain, headaches, palpitations or shortness of breath.  OA hands and plantar fasciitis - takes Celebrex daily.  Did not tolerate Mobic due to lack of benefit..  Lab Results  Component Value Date   CREATININE 0.85 10/07/2019   BUN 12 10/07/2019   NA 140 10/07/2019   K 4.0 10/07/2019   CL 99 10/07/2019   CO2 23 10/07/2019   Lab Results  Component Value Date   CHOL 174 10/07/2019   HDL 53 10/07/2019   LDLCALC 90 10/07/2019   TRIG 184 (H) 10/07/2019   CHOLHDL 3.3 10/07/2019   Lab Results  Component Value Date   TSH 2.740 10/07/2019   Lab Results  Component Value Date   HGBA1C 6.1 (H) 02/01/2020   Lab Results  Component Value Date   WBC 9.3 10/07/2019   HGB 14.1 10/07/2019   HCT 41.1 10/07/2019   MCV 89 10/07/2019   PLT 317 10/07/2019   Lab Results  Component Value Date   ALT 17 10/07/2019   AST 19 10/07/2019   ALKPHOS 59 10/07/2019   BILITOT 0.6 10/07/2019     Review of Systems  Constitutional: Negative for appetite change, fatigue, fever and unexpected weight change.  HENT:  Negative for tinnitus and trouble swallowing.   Eyes: Negative for visual disturbance.  Respiratory: Negative for cough, chest tightness and shortness of breath.   Cardiovascular: Negative for chest pain, palpitations and leg swelling.  Gastrointestinal: Negative for abdominal pain.  Endocrine: Negative for polydipsia and polyuria.  Genitourinary: Negative for dysuria and hematuria.  Musculoskeletal: Positive for arthralgias (plantar fasciitis).  Neurological: Negative for tremors, numbness and headaches.  Psychiatric/Behavioral: Negative for dysphoric mood.    Patient Active Problem List   Diagnosis Date Noted  . Gastroesophageal reflux disease 08/13/2018  . Slow transit constipation 08/13/2018  . Type II diabetes mellitus with complication ([place]) 85/09/7739  . Dry mouth 10/10/2016  . Ovarian failure 10/10/2016  . Allergic rhinitis, seasonal 04/30/2015  . Hepatic steatosis 04/30/2015  . Osteoarthritis of both hands 04/30/2015  . Genital herpes 04/04/2009  . Depression, major, single episode, in partial remission ([place]) 08/18/1998  . Essential hypertension 08/18/1998  . Hyperlipidemia associated with type 2 diabetes mellitus ([place]) 08/18/1998  . Primary insomnia 08/18/1998    Allergies  Allergen Reactions  . Clonazepam Other (See Comments)    Nocturnal enuresis; Lethargy   . Trazodone Anxiety and Other (See Comments)    Dizziness     Past Surgical History:  Procedure Laterality Date  . CYST EXCISION Left 2017   foot  . HAND SURGERY  07/2012    Social History   Tobacco Use  .  Smoking status: Never Smoker  . Smokeless tobacco: Never Used  . Tobacco comment: smoking cessation materials not required  Vaping Use  . Vaping Use: Never used  Substance Use Topics  . Alcohol use: No  . Drug use: No     Medication list has been reviewed and updated.  Current Meds  Medication Sig  . acyclovir (ZOVIRAX) 200 MG capsule Take 1 capsule (200 mg total) by mouth 5 (five)  times daily.  [name] atenolol (TENORMIN) 50 MG tablet Take 1 tablet (50 mg total) by mouth daily.  [name] atorvastatin (LIPITOR) 10 MG tablet Take 1 tablet (10 mg total) by mouth at bedtime.  . Bilberry 100 MG CAPS Take by mouth.  . celecoxib (CELEBREX) 200 MG capsule TAKE 1 CAPSULE BY MOUTH EVERY DAY  . Coenzyme Q10 (CO Q 10) 100 MG CAPS Take 2 capsules by mouth daily.   . Continuous Blood Gluc Receiver (FREESTYLE LIBRE 2 READER SYSTM) DEVI 1 each by Does not apply route as needed. Use to test Blood Sugar.  . estazolam (PROSOM) 2 MG tablet TAKE 1 TABLET (2 MG TOTAL) BY MOUTH AT BEDTIME.  [name] glucose blood (FREESTYLE LITE) test strip Use to test BS twice daily  . hydrochlorothiazide (HYDRODIURIL) 12.5 MG tablet Take 1 tablet (12.5 mg total) by mouth daily.  . Lancets (FREESTYLE) lancets Use to test BS once daily  . Lutein 20 MG TABS Take 1 tablet by mouth daily.   . metFORMIN (GLUCOPHAGE) 1000 MG tablet Take 1 tablet (1,000 mg total) by mouth 2 (two) times daily with a meal.  . MULTIPLE VITAMIN PO 1 tablet daily.   [name] olopatadine (PATANOL) 0.1 % ophthalmic solution Apply to eye.  . Omega-3 Fatty Acids (FISH OIL PEARLS PO) Take by mouth.  [name] omeprazole (PRILOSEC) 20 MG capsule Take 1 capsule (20 mg total) by mouth daily.  [name] PARoxetine (PAXIL) 20 MG tablet Take 1 tablet (20 mg total) by mouth daily.  . sitaGLIPtin (JANUVIA) 100 MG tablet Take 1 tablet (100 mg total) by mouth daily.  [name] triamcinolone cream (KENALOG) 0.5 % Apply 1 application topically 3 (three) times daily. To rash on abdomen  . TURMERIC PO Take 800 mg by mouth.  . valsartan (DIOVAN) 160 MG tablet Take 1 tablet (160 mg total) by mouth daily.  . vitamin E (VITAMIN E) 400 UNIT capsule Take 1 capsule (400 Units total) by mouth daily.  . [DISCONTINUED] celecoxib (CELEBREX) 200 MG capsule TAKE 1 CAPSULE BY MOUTH EVERY DAY  . [DISCONTINUED] hydrochlorothiazide (HYDRODIURIL) 12.5 MG tablet Take 1 tablet (12.5 mg total) by mouth daily.    PHQ 2/9  Scores 06/04/2020 02/01/2020 10/07/2019 08/31/2019  PHQ - 2 Score 0 0 0 1  PHQ- 9 Score 0 0 2 2    GAD 7 : Generalized Anxiety Score 06/04/2020 02/01/2020 08/16/2019  Nervous, Anxious, on Edge 0 1 0  Control/stop worrying 0 2 0  Worry too much - different things 0 2 2  Trouble relaxing 0 0 0  Restless 0 0 0  Easily annoyed or irritable 0 0 0  Afraid - awful might happen 0 3 1  Total GAD 7 Score 0 8 3  Anxiety Difficulty Not difficult at all Not difficult at all Not difficult at all    BP Readings from Last 3 Encounters:  06/04/20 124/70  02/01/20 120/68  10/07/19 130/82    Physical Exam Vitals and nursing note reviewed.  Constitutional:      General: She is not in  acute distress.    Appearance: She is well-developed.  HENT:     Head: Normocephalic and atraumatic.  Cardiovascular:     Rate and Rhythm: Normal rate and regular rhythm.     Pulses: Normal pulses.  Pulmonary:     Effort: Pulmonary effort is normal. No respiratory distress.  Musculoskeletal:        General: Normal range of motion.     Cervical back: Normal range of motion.     Right lower leg: No edema.     Left lower leg: No edema.  Lymphadenopathy:     Cervical: No cervical adenopathy.  Skin:    General: Skin is warm and dry.     Capillary Refill: Capillary refill takes less than 2 seconds.     Findings: No rash.  Neurological:     General: No focal deficit present.     Mental Status: She is alert and oriented to person, place, and time.  Psychiatric:        Mood and Affect: Mood normal.     Wt Readings from Last 3 Encounters:  06/04/20 142 lb (64.4 kg)  02/01/20 147 lb (66.7 kg)  10/07/19 143 lb (64.9 kg)    BP 124/70   Pulse 73   Temp 98 F (36.7 C) (Oral)   Ht 5' 4.5" (1.638 m)   Wt 142 lb (64.4 kg)   SpO2 97%   BMI 24.00 kg/m   Assessment and Plan: 1. Type II diabetes mellitus with complication (HCC) Clinically stable by exam and report without s/s of hypoglycemia. DM complicated by  htn. Tolerating medications well without side effects or other concerns. - Hemoglobin A1c - Comprehensive metabolic panel  2. Essential hypertension Clinically stable exam with well controlled BP on hctz, atenolol and diovan. Tolerating medications without side effects at this time. Pt to continue current regimen and low sodium diet; benefits of regular exercise as able discussed. - hydrochlorothiazide (HYDRODIURIL) 12.5 MG tablet; Take 1 tablet (12.5 mg total) by mouth daily.  Dispense: 90 tablet; Refill: 3  3. Primary osteoarthritis of both hands Refill Celebrex - celecoxib (CELEBREX) 200 MG capsule; TAKE 1 CAPSULE BY MOUTH EVERY DAY  Dispense: 90 capsule; Refill: 3   Partially dictated using Editor, commissioning. Any errors are unintentional.  Halina Maidens, MD [facility]  06/04/2020

## 2020-06-05 LAB — COMPREHENSIVE METABOLIC PANEL
ALT: 25 IU/L (ref 0–32)
AST: 25 IU/L (ref 0–40)
Albumin/Globulin Ratio: 1.9 (ref 1.2–2.2)
Albumin: 4.8 g/dL (ref 3.8–4.8)
Alkaline Phosphatase: 59 IU/L (ref 44–121)
BUN/Creatinine Ratio: 11 — ABNORMAL LOW (ref 12–28)
BUN: 9 mg/dL (ref 8–27)
Bilirubin Total: 0.6 mg/dL (ref 0.0–1.2)
CO2: 25 mmol/L (ref 20–29)
Calcium: 9.7 mg/dL (ref 8.7–10.3)
Chloride: 97 mmol/L (ref 96–106)
Creatinine, Ser: 0.84 mg/dL (ref 0.57–1.00)
GFR calc Af Amer: 83 mL/min/{1.73_m2} (ref >59)
GFR calc non Af Amer: 72 mL/min/{1.73_m2} (ref >59)
Globulin, Total: 2.5 g/dL (ref 1.5–4.5)
Glucose: 113 mg/dL — ABNORMAL HIGH (ref 65–99)
Potassium: 4.2 mmol/L (ref 3.5–5.2)
Sodium: 136 mmol/L (ref 134–144)
Total Protein: 7.3 g/dL (ref 6.0–8.5)

## 2020-06-05 LAB — HEMOGLOBIN A1C
Est. average glucose Bld gHb Est-mCnc: 120 mg/dL
Hgb A1c MFr Bld: 5.8 % — ABNORMAL HIGH (ref 4.8–5.6)

## 2020-07-01 ENCOUNTER — Other Ambulatory Visit: Payer: Self-pay | Admitting: Internal Medicine

## 2020-07-01 ENCOUNTER — Encounter: Payer: Self-pay | Admitting: Internal Medicine

## 2020-07-01 DIAGNOSIS — K219 Gastro-esophageal reflux disease without esophagitis: Secondary | ICD-10-CM

## 2020-07-01 DIAGNOSIS — A6000 Herpesviral infection of urogenital system, unspecified: Secondary | ICD-10-CM

## 2020-07-01 DIAGNOSIS — I10 Essential (primary) hypertension: Secondary | ICD-10-CM

## 2020-07-01 DIAGNOSIS — F324 Major depressive disorder, single episode, in partial remission: Secondary | ICD-10-CM

## 2020-07-01 DIAGNOSIS — E785 Hyperlipidemia, unspecified: Secondary | ICD-10-CM

## 2020-07-01 DIAGNOSIS — E118 Type 2 diabetes mellitus with unspecified complications: Secondary | ICD-10-CM

## 2020-07-01 DIAGNOSIS — E1169 Type 2 diabetes mellitus with other specified complication: Secondary | ICD-10-CM

## 2020-07-01 MED ORDER — ATENOLOL 50 MG PO TABS: 50.0000 mg | ORAL_TABLET | Freq: Every day | ORAL | 1 refills | Status: DC

## 2020-07-01 MED ORDER — PAROXETINE HCL 20 MG PO TABS: 20.0000 mg | ORAL_TABLET | Freq: Every day | ORAL | 1 refills | Status: DC

## 2020-07-01 MED ORDER — ATORVASTATIN CALCIUM 10 MG PO TABS: 10.0000 mg | ORAL_TABLET | Freq: Every day | ORAL | 1 refills | Status: DC

## 2020-07-01 MED ORDER — METFORMIN HCL 1000 MG PO TABS: 1000.0000 mg | ORAL_TABLET | Freq: Two times a day (BID) | ORAL | 1 refills | Status: DC

## 2020-07-01 MED ORDER — SITAGLIPTIN PHOSPHATE 100 MG PO TABS: 100.0000 mg | ORAL_TABLET | Freq: Every day | ORAL | 1 refills | Status: DC

## 2020-07-01 MED ORDER — ACYCLOVIR 200 MG PO CAPS: 200.0000 mg | ORAL_CAPSULE | Freq: Every day | ORAL | 1 refills | Status: DC

## 2020-07-01 MED ORDER — VALSARTAN 160 MG PO TABS: 160.0000 mg | ORAL_TABLET | Freq: Every day | ORAL | 1 refills | Status: DC

## 2020-07-01 MED ORDER — OMEPRAZOLE 20 MG PO CPDR: 20.0000 mg | DELAYED_RELEASE_CAPSULE | Freq: Every day | ORAL | 1 refills | Status: DC

## 2020-08-26 ENCOUNTER — Other Ambulatory Visit: Payer: Self-pay | Admitting: Internal Medicine

## 2020-08-26 DIAGNOSIS — R21 Rash and other nonspecific skin eruption: Secondary | ICD-10-CM

## 2020-09-03 ENCOUNTER — Ambulatory Visit: Payer: Self-pay

## 2020-09-10 ENCOUNTER — Ambulatory Visit (INDEPENDENT_AMBULATORY_CARE_PROVIDER_SITE_OTHER): Payer: Medicare Other

## 2020-09-10 ENCOUNTER — Telehealth: Payer: Self-pay

## 2020-09-10 DIAGNOSIS — Z1231 Encounter for screening mammogram for malignant neoplasm of breast: Secondary | ICD-10-CM | POA: Diagnosis not present

## 2020-09-10 DIAGNOSIS — Z0000 Encounter for general adult medical examination without abnormal findings: Secondary | ICD-10-CM | POA: Diagnosis not present

## 2020-09-10 DIAGNOSIS — Z780 Asymptomatic menopausal state: Secondary | ICD-10-CM

## 2020-09-10 NOTE — Telephone Encounter (Signed)
Pt requested clarification for what was being ordered for her today. Pt advised mammogram and bone density ordered to complete in April and pt to discuss colonoscopy referral at next visit with PCP bc she would also like to have an endoscopy at the same time. Pt is not due for colonoscopy until 07/2021.

## 2020-09-10 NOTE — Progress Notes (Signed)
Subjective:   Jenna Stein is a 70 y.o. female who presents for Medicare Annual (Subsequent) preventive examination.  Virtual Visit via Telephone Note  I connected with  [name] on 09/10/20 at 10:40 AM EST by telephone and verified that I am speaking with the correct person using two identifiers.  Location: Patient: home Provider: [facility] Persons participating in the virtual visit: [name]   I discussed the limitations, risks, security and privacy concerns of performing an evaluation and management service by telephone and the availability of in person appointments. The patient expressed understanding and agreed to proceed.  Interactive audio and video telecommunications were attempted between this nurse and patient, however failed, due to patient having technical difficulties OR patient did not have access to video capability.  We continued and completed visit with audio only.  Some vital signs may be absent or patient reported.   [name], LPN     Review of Systems     Cardiac Risk Factors include: advanced age (>65men, >44 women);diabetes mellitus;dyslipidemia;hypertension     Objective:    There were no vitals filed for this visit. There is no height or weight on file to calculate BMI.  Advanced Directives 09/10/2020 08/31/2019 03/08/2018 02/16/2017 06/10/2016 05/09/2015  Does Patient Have a Medical Advance Directive? Yes Yes Yes Yes Yes Yes  Type of Paramedic of [place];Living will [place];Living will [place];Living will [place];Living will [place];Living will Living will;Healthcare Power of [place] in Chart? Yes - validated most recent copy scanned in chart (See row information) Yes - validated most recent copy scanned in chart (See row information) No - copy requested No - copy requested - -     Current Medications (verified) Outpatient Encounter Medications as of 09/10/2020  Medication Sig  . acyclovir (ZOVIRAX) 200 MG capsule Take 1 capsule (200 mg total) by mouth 5 (five) times daily.  [name] atenolol (TENORMIN) 50 MG tablet Take 1 tablet (50 mg total) by mouth daily.  [name] atorvastatin (LIPITOR) 10 MG tablet Take 1 tablet (10 mg total) by mouth at bedtime.  . Bilberry 100 MG CAPS Take by mouth.  . celecoxib (CELEBREX) 200 MG capsule TAKE 1 CAPSULE BY MOUTH EVERY DAY  . Coenzyme Q10 (CO Q 10) 100 MG CAPS Take 2 capsules by mouth daily.   [name] estazolam (PROSOM) 2 MG tablet TAKE 1 TABLET (2 MG TOTAL) BY MOUTH AT BEDTIME.  [name] glucose blood (FREESTYLE LITE) test strip Use to test BS twice daily  . hydrochlorothiazide (HYDRODIURIL) 12.5 MG tablet Take 1 tablet (12.5 mg total) by mouth daily.  . Lancets (FREESTYLE) lancets Use to test BS once daily  . Lutein 20 MG TABS Take 1 tablet by mouth daily.   . metFORMIN (GLUCOPHAGE) 1000 MG tablet Take 1 tablet (1,000 mg total) by mouth 2 (two) times daily with a meal.  . MULTIPLE VITAMIN PO 1 tablet daily.   [name] olopatadine (PATANOL) 0.1 % ophthalmic solution Apply to eye.  . Omega-3 Fatty Acids (FISH OIL PEARLS PO) Take by mouth.  [name] omeprazole (PRILOSEC) 20 MG capsule Take 1 capsule (20 mg total) by mouth daily.  [name] PARoxetine (PAXIL) 20 MG tablet Take 1 tablet (20 mg total) by mouth daily.  . sitaGLIPtin (JANUVIA) 100 MG tablet Take 1 tablet (100 mg total) by mouth daily.  [name] triamcinolone cream (KENALOG) 0.5 % APPLY 1 APPLICATION TOPICALLY  3 (THREE) TIMES DAILY. TO RASH ON ABDOMEN  . TURMERIC PO Take 800 mg by mouth.  . valsartan (DIOVAN) 160 MG tablet Take 1 tablet (160 mg total) by mouth daily.  . vitamin E (VITAMIN E) 400 UNIT capsule Take 1 capsule (400 Units total) by mouth daily.  . [DISCONTINUED] Continuous Blood Gluc Receiver (FREESTYLE LIBRE 2 READER SYSTM) DEVI 1 each by Does not apply route as needed. Use to test Blood Sugar.   No  facility-administered encounter medications on file as of 09/10/2020.    Allergies (verified) Clonazepam and Trazodone   History: Past Medical History:  Diagnosis Date  . Allergy   . Anxiety   . Arthritis 2010  . Depression   . Diabetes mellitus without complication (HCC)   . GERD (gastroesophageal reflux disease)   . Hyperlipidemia   . Hypertension    Past Surgical History:  Procedure Laterality Date  . CYST EXCISION Left 2017   foot  . HAND SURGERY  07/2012   Family History  Problem Relation Age of Onset  . Hypertension Mother   . Hyperlipidemia Mother   . Arthritis Mother   . Parkinsonism Father   . Arthritis Other   . Hypertension Other   . Hyperlipidemia Other   . Hypothyroidism Other   . Breast cancer Sister 6  . Cancer Sister    Social History   Socioeconomic History  . Marital status: Married    Spouse name: Not on file  . Number of children: 1  . Years of education: Not on file  . Highest education level: Bachelor's degree (e.g., BA, AB, BS)  Occupational History  . Occupation: Retired  Tobacco Use  . Smoking status: Never Smoker  . Smokeless tobacco: Never Used  . Tobacco comment: smoking cessation materials not required  Vaping Use  . Vaping Use: Never used  Substance and Sexual Activity  . Alcohol use: No  . Drug use: No  . Sexual activity: Not Currently    Birth control/protection: None  Other Topics Concern  . Not on file  Social History Narrative  . Not on file   Social Determinants of Health   Financial Resource Strain: Low Risk   . Difficulty of Paying Living Expenses: Not hard at all  Food Insecurity: No Food Insecurity  . Worried About Programme researcher, broadcasting/film/video in the Last Year: Never true  . Ran Out of Food in the Last Year: Never true  Transportation Needs: No Transportation Needs  . Lack of Transportation (Medical): No  . Lack of Transportation (Non-Medical): No  Physical Activity: Sufficiently Active  . Days of Exercise per  Week: 5 days  . Minutes of Exercise per Session: 30 min  Stress: No Stress Concern Present  . Feeling of Stress : Not at all  Social Connections: Moderately Isolated  . Frequency of Communication with Friends and Family: More than three times a week  . Frequency of Social Gatherings with Friends and Family: Once a week  . Attends Religious Services: Never  . Active Member of Clubs or Organizations: No  . Attends Banker Meetings: Never  . Marital Status: Married    Tobacco Counseling Counseling given: Not Answered Comment: smoking cessation materials not required   Clinical Intake:  Pre-visit preparation completed: Yes  Pain : No/denies pain     Nutritional Risks: None Diabetes: Yes CBG done?: No Did pt. bring in CBG monitor from home?: No  How often do you need to have someone help you  when you read instructions, pamphlets, or other written materials from your doctor or pharmacy?: 1 - Never  Nutrition Risk Assessment:  Has the patient had any N/V/D within the last 2 months?  No  Does the patient have any non-healing wounds?  No  Has the patient had any unintentional weight loss or weight gain?  No   Diabetes:  Is the patient diabetic?  Yes  If diabetic, was a CBG obtained today?  No  Did the patient bring in their glucometer from home?  No  How often do you monitor your CBG's? daily.   Financial Strains and Diabetes Management:  Are you having any financial strains with the device, your supplies or your medication? No .  Does the patient want to be seen by Chronic Care Management for management of their diabetes?  No  Would the patient like to be referred to a Nutritionist or for Diabetic Management?  No   Diabetic Exams:  Diabetic Eye Exam: Completed 12/06/19 negative retinopathy.   Diabetic Foot Exam: Completed 06/04/20.   Interpreter Needed?: No  Information entered by :: Reather Littler LPN   Activities of Daily Living In your present state  of health, do you have any difficulty performing the following activities: 09/10/2020  Hearing? N  Comment declines hearing aids  Vision? N  Difficulty concentrating or making decisions? N  Walking or climbing stairs? N  Dressing or bathing? N  Doing errands, shopping? N  Preparing Food and eating ? N  Using the Toilet? N  In the past six months, have you accidently leaked urine? N  Do you have problems with loss of bowel control? N  Managing your Medications? N  Managing your Finances? N  Housekeeping or managing your Housekeeping? N  Some recent data might be hidden    Patient Care Team: Reubin Milan, MD as PCP - General (Internal Medicine) [facility] (Ophthalmology)  Indicate any recent Medical Services you may have received from other than [facility] providers in the past year (date may be approximate).     Assessment:   This is a routine wellness examination for Billy Fischer.  Hearing/Vision screen  Hearing Screening   125Hz  250Hz  500Hz  1000Hz  2000Hz  3000Hz  4000Hz  6000Hz  8000Hz   Right ear:           Left ear:           Comments: Pt denies hearing difficulty  Vision Screening Comments: [facility] screenings [facility]  Dietary issues and exercise activities discussed: Current Exercise Habits: Home exercise routine, Type of exercise: Other - see comments (dance aerobics), Time (Minutes): 30, Frequency (Times/Week): 5, Weekly Exercise (Minutes/Week): 150, Intensity: Moderate, Exercise limited by: None identified  Goals    . DIET - INCREASE WATER INTAKE     Recommend to drink at least 6-8 8oz glasses of water per day.      Depression Screen PHQ 2/9 Scores 09/10/2020 06/04/2020 02/01/2020 10/07/2019 08/31/2019 08/16/2019 02/14/2019  PHQ - 2 Score 0 0 0 0 1 1 0  PHQ- 9 Score - 0 0 2 2 3 6     Fall Risk Fall Risk  09/10/2020 06/04/2020 02/01/2020 08/31/2019 08/16/2019  Falls in the past year? 0 0 0 0 0  Number falls in past yr: 0 0 0 0 -  Injury with  Fall? 0 0 0 0 -  Risk for fall due to : History of fall(s) No Fall Risks No Fall Risks No Fall Risks -  Risk for  fall due to: Comment - - - - -  Follow up Falls prevention discussed Falls evaluation completed Falls evaluation completed Falls prevention discussed Falls evaluation completed    [place]:  Any stairs in or around the home? No  If so, are there any without handrails? No  Home free of loose throw rugs in walkways, pet beds, electrical cords, etc? Yes  Adequate lighting in your home to reduce risk of falls? Yes   ASSISTIVE DEVICES UTILIZED TO PREVENT FALLS:  Life alert? No  Use of a cane, walker or w/c? No  Grab bars in the bathroom? No  Shower chair or bench in shower? Yes  Elevated toilet seat or a handicapped toilet? Yes   TIMED UP AND GO:  Was the test performed? No . Telephonic visit.  Cognitive Function: Normal cognitive status assessed by direct observation by this Nurse Health Advisor. No abnormalities found.       6CIT Screen 03/08/2018 02/16/2017  What Year? 0 points 0 points  What month? 0 points 0 points  What time? 0 points 0 points  Count back from 20 0 points 0 points  Months in reverse 2 points 0 points  Repeat phrase 0 points 0 points  Total Score 2 0    Immunizations Immunization History  Administered Date(s) Administered  . Influenza Split 04/18/2011, 05/24/2012  . Influenza, High Dose Seasonal PF 07/03/2017, 04/29/2018  . Influenza,inj,Quad PF,6+ Mos 05/02/2013, 05/03/2014, 05/09/2015  . Influenza,inj,quad, With Preservative 07/18/2016  . Influenza-Unspecified 04/18/2017, 05/03/2018, 05/18/2020  . PFIZER(Purple Top)SARS-COV-2 Vaccination 09/27/2019, 10/18/2019, 04/18/2020  . Pneumococcal Conjugate-13 10/10/2016  . Pneumococcal Polysaccharide-23 08/07/2017  . Td 08/04/2003  . Tdap 08/04/2003, 04/18/2011  . Zoster 01/12/2013  . Zoster Recombinat (Shingrix) 04/29/2018, 07/29/2018    TDAP status: Up to  date  Flu Vaccine status: Up to date  Pneumococcal vaccine status: Up to date  Covid-19 vaccine status: Completed vaccines  Qualifies for Shingles Vaccine? Yes   Zostavax completed Yes   Shingrix Completed?: Yes  Screening Tests Health Maintenance  Topic Date Due  . HEMOGLOBIN A1C  12/03/2020  . MAMMOGRAM  12/04/2020  . OPHTHALMOLOGY EXAM  12/05/2020  . TETANUS/TDAP  04/17/2021  . FOOT EXAM  06/04/2021  . COLONOSCOPY (Pts 45-24yrs Insurance coverage will need to be confirmed)  07/22/2021  . INFLUENZA VACCINE  Completed  . DEXA SCAN  Completed  . COVID-19 Vaccine  Completed  . Hepatitis C Screening  Completed  . PNA vac Low Risk Adult  Completed    Health Maintenance  There are no preventive care reminders to display for this patient.  Colorectal cancer screening: Type of screening: Colonoscopy. Completed 07/23/11. Repeat every 10 years  Mammogram status: Completed 12/05/19. Repeat every year. Ordered today.  Bone Density status: Completed 11/03/16. Results reflect: Bone density results: OSTEOPENIA. Repeat every 2 years. Ordered today.   Lung Cancer Screening: (Low Dose CT Chest recommended if Age 47-80 years, 30 pack-year currently smoking OR have quit w/in 15years.) does not qualify.    Additional Screening:  Hepatitis C Screening: does qualify; Completed 12/26/15  Vision Screening: Recommended annual ophthalmology exams for early detection of glaucoma and other disorders of the eye. Is the patient up to date with their annual eye exam?  Yes  Who is the provider or what is the name of the office in which the patient attends annual eye exams? [place]  Dental Screening: Recommended annual dental exams for proper oral hygiene  [facility]  Referral / Chronic Care Management: CRR required this visit?  No   CCM required this visit?  No      Plan:     I have personally reviewed and noted the following in the patient's chart:   . Medical and  social history . Use of alcohol, tobacco or illicit drugs  . Current medications and supplements . Functional ability and status . Nutritional status . Physical activity . Advanced directives . List of other physicians . Hospitalizations, surgeries, and ER visits in previous 12 months . Vitals . Screenings to include cognitive, depression, and falls . Referrals and appointments  In addition, I have reviewed and discussed with patient certain preventive protocols, quality metrics, and best practice recommendations. A written personalized care plan for preventive services as well as general preventive health recommendations were provided to patient.     [name], LPN   10/23/6576   Nurse Notes: pt states her husband tested positive for Covid with at home test last Monday 09/03/20. Pt was also experiencing symptoms 3-4 days prior to test and she is still experiencing congestion and coughing. Pt taking mucinex and remaining in isolation until sxs resolve. Pt aware to contact office if needed.

## 2020-09-10 NOTE — Patient Instructions (Signed)
Ms. Demps , Thank you for taking time to come for your Medicare Wellness Visit. I appreciate your ongoing commitment to your health goals. Please review the following plan we discussed and let me know if I can assist you in the future.   Screening recommendations/referrals: Colonoscopy: done 07/23/11. Repeat 07/2021. Mammogram: done 12/05/19. Please call [phone] to schedule your mammogram and bone density screening.  Bone Density: done 11/03/16 Recommended yearly ophthalmology/optometry visit for glaucoma screening and checkup Recommended yearly dental visit for hygiene and checkup  Vaccinations: Influenza vaccine: done 05/18/20 Pneumococcal vaccine: done 08/07/17 Tdap vaccine: done 04/18/11 Shingles vaccine: done 04/29/18 & 07/29/18   Covid-19: done 09/27/19, 10/18/19 & 04/18/20  Conditions/risks identified: Keep up the great work!  Next appointment: Follow up in one year for your annual wellness visit    Preventive Care 65 Years and Older, Female Preventive care refers to lifestyle choices and visits with your health care provider that can promote health and wellness. What does preventive care include?  A yearly physical exam. This is also called an annual well check.  Dental exams once or twice a year.  Routine eye exams. Ask your health care provider how often you should have your eyes checked.  Personal lifestyle choices, including:  Daily care of your teeth and gums.  Regular physical activity.  Eating a healthy diet.  Avoiding tobacco and drug use.  Limiting alcohol use.  Practicing safe sex.  Taking low-dose aspirin every day.  Taking vitamin and mineral supplements as recommended by your health care provider. What happens during an annual well check? The services and screenings done by your health care provider during your annual well check will depend on your age, overall health, lifestyle risk factors, and family history of disease. Counseling  Your health  care provider may ask you questions about your:  Alcohol use.  Tobacco use.  Drug use.  Emotional well-being.  Home and relationship well-being.  Sexual activity.  Eating habits.  History of falls.  Memory and ability to understand (cognition).  Work and work Statistician.  Reproductive health. Screening  You may have the following tests or measurements:  Height, weight, and BMI.  Blood pressure.  Lipid and cholesterol levels. These may be checked every 5 years, or more frequently if you are over 22 years old.  Skin check.  Lung cancer screening. You may have this screening every year starting at age 55 if you have a 30-pack-year history of smoking and currently smoke or have quit within the past 15 years.  Fecal occult blood test (FOBT) of the stool. You may have this test every year starting at age 18.  Flexible sigmoidoscopy or colonoscopy. You may have a sigmoidoscopy every 5 years or a colonoscopy every 10 years starting at age 36.  Hepatitis C blood test.  Hepatitis B blood test.  Sexually transmitted disease (STD) testing.  Diabetes screening. This is done by checking your blood sugar (glucose) after you have not eaten for a while (fasting). You may have this done every 1-3 years.  Bone density scan. This is done to screen for osteoporosis. You may have this done starting at age 13.  Mammogram. This may be done every 1-2 years. Talk to your health care provider about how often you should have regular mammograms. Talk with your health care provider about your test results, treatment options, and if necessary, the need for more tests. Vaccines  Your health care provider may recommend certain vaccines, such as:  Influenza vaccine. This  is recommended every year.  Tetanus, diphtheria, and acellular pertussis (Tdap, Td) vaccine. You may need a Td booster every 10 years.  Zoster vaccine. You may need this after age 37.  Pneumococcal 13-valent conjugate  (PCV13) vaccine. One dose is recommended after age 64.  Pneumococcal polysaccharide (PPSV23) vaccine. One dose is recommended after age 32. Talk to your health care provider about which screenings and vaccines you need and how often you need them. This information is not intended to replace advice given to you by your health care provider. Make sure you discuss any questions you have with your health care provider. Document Released: 08/31/2015 Document Revised: 04/23/2016 Document Reviewed: 06/05/2015 Elsevier Interactive Patient Education  2017 [employer] Prevention in the Home Falls can cause injuries. They can happen to people of all ages. There are many things you can do to make your home safe and to help prevent falls. What can I do on the outside of my home?  Regularly fix the edges of walkways and driveways and fix any cracks.  Remove anything that might make you trip as you walk through a door, such as a raised step or threshold.  Trim any bushes or trees on the path to your home.  Use bright outdoor lighting.  Clear any walking paths of anything that might make someone trip, such as rocks or tools.  Regularly check to see if handrails are loose or broken. Make sure that both sides of any steps have handrails.  Any raised decks and porches should have guardrails on the edges.  Have any leaves, snow, or ice cleared regularly.  Use sand or salt on walking paths during winter.  Clean up any spills in your garage right away. This includes oil or grease spills. What can I do in the bathroom?  Use night lights.  Install grab bars by the toilet and in the tub and shower. Do not use towel bars as grab bars.  Use non-skid mats or decals in the tub or shower.  If you need to sit down in the shower, use a plastic, non-slip stool.  Keep the floor dry. Clean up any water that spills on the floor as soon as it happens.  Remove soap buildup in the tub or shower  regularly.  Attach bath mats securely with double-sided non-slip rug tape.  Do not have throw rugs and other things on the floor that can make you trip. What can I do in the bedroom?  Use night lights.  Make sure that you have a light by your bed that is easy to reach.  Do not use any sheets or blankets that are too big for your bed. They should not hang down onto the floor.  Have a firm chair that has side arms. You can use this for support while you get dressed.  Do not have throw rugs and other things on the floor that can make you trip. What can I do in the kitchen?  Clean up any spills right away.  Avoid walking on wet floors.  Keep items that you use a lot in easy-to-reach places.  If you need to reach something above you, use a strong step stool that has a grab bar.  Keep electrical cords out of the way.  Do not use floor polish or wax that makes floors slippery. If you must use wax, use non-skid floor wax.  Do not have throw rugs and other things on the floor that can make you  trip. What can I do with my stairs?  Do not leave any items on the stairs.  Make sure that there are handrails on both sides of the stairs and use them. Fix handrails that are broken or loose. Make sure that handrails are as long as the stairways.  Check any carpeting to make sure that it is firmly attached to the stairs. Fix any carpet that is loose or worn.  Avoid having throw rugs at the top or bottom of the stairs. If you do have throw rugs, attach them to the floor with carpet tape.  Make sure that you have a light switch at the top of the stairs and the bottom of the stairs. If you do not have them, ask someone to add them for you. What else can I do to help prevent falls?  Wear shoes that:  Do not have high heels.  Have rubber bottoms.  Are comfortable and fit you well.  Are closed at the toe. Do not wear sandals.  If you use a stepladder:  Make sure that it is fully  opened. Do not climb a closed stepladder.  Make sure that both sides of the stepladder are locked into place.  Ask someone to hold it for you, if possible.  Clearly mark and make sure that you can see:  Any grab bars or handrails.  First and last steps.  Where the edge of each step is.  Use tools that help you move around (mobility aids) if they are needed. These include:  Canes.  Walkers.  Scooters.  Crutches.  Turn on the lights when you go into a dark area. Replace any light bulbs as soon as they burn out.  Set up your furniture so you have a clear path. Avoid moving your furniture around.  If any of your floors are uneven, fix them.  If there are any pets around you, be aware of where they are.  Review your medicines with your doctor. Some medicines can make you feel dizzy. This can increase your chance of falling. Ask your doctor what other things that you can do to help prevent falls. This information is not intended to replace advice given to you by your health care provider. Make sure you discuss any questions you have with your health care provider. Document Released: 05/31/2009 Document Revised: 01/10/2016 Document Reviewed: 09/08/2014 Elsevier Interactive Patient Education  2017 [employer].

## 2020-09-10 NOTE — Telephone Encounter (Signed)
Copied from [place] [id number]. Topic: General - Other >> Sep 10, 2020 11:03 AM Celene Kras wrote: Reason for CRM: Pt calling and is requesting to speak with [facility]. She states that she has a follow up question from appt this morning. Please advise.

## 2020-09-17 DIAGNOSIS — Z20822 Contact with and (suspected) exposure to covid-19: Secondary | ICD-10-CM | POA: Diagnosis not present

## 2020-10-09 ENCOUNTER — Ambulatory Visit (INDEPENDENT_AMBULATORY_CARE_PROVIDER_SITE_OTHER): Payer: Medicare Other | Admitting: Internal Medicine

## 2020-10-09 ENCOUNTER — Other Ambulatory Visit: Payer: Self-pay

## 2020-10-09 ENCOUNTER — Encounter: Payer: Self-pay | Admitting: Internal Medicine

## 2020-10-09 VITALS — BP 142/78 | HR 67 | Temp 97.7°F | Ht 64.5 in | Wt 143.0 lb

## 2020-10-09 DIAGNOSIS — N760 Acute vaginitis: Secondary | ICD-10-CM | POA: Diagnosis not present

## 2020-10-09 DIAGNOSIS — F324 Major depressive disorder, single episode, in partial remission: Secondary | ICD-10-CM | POA: Diagnosis not present

## 2020-10-09 DIAGNOSIS — E118 Type 2 diabetes mellitus with unspecified complications: Secondary | ICD-10-CM

## 2020-10-09 DIAGNOSIS — K219 Gastro-esophageal reflux disease without esophagitis: Secondary | ICD-10-CM

## 2020-10-09 DIAGNOSIS — I10 Essential (primary) hypertension: Secondary | ICD-10-CM | POA: Diagnosis not present

## 2020-10-09 DIAGNOSIS — E1169 Type 2 diabetes mellitus with other specified complication: Secondary | ICD-10-CM | POA: Diagnosis not present

## 2020-10-09 DIAGNOSIS — E785 Hyperlipidemia, unspecified: Secondary | ICD-10-CM

## 2020-10-09 DIAGNOSIS — A6000 Herpesviral infection of urogenital system, unspecified: Secondary | ICD-10-CM

## 2020-10-09 LAB — POCT URINALYSIS DIPSTICK
Bilirubin, UA: NEGATIVE
Glucose, UA: NEGATIVE
Ketones, UA: NEGATIVE
Leukocytes, UA: NEGATIVE
Nitrite, UA: NEGATIVE
Protein, UA: NEGATIVE
Spec Grav, UA: <=1.005 — AB (ref 1.010–1.025)
Urobilinogen, UA: 0.2 E.U./dL
pH, UA: 7.5 (ref 5.0–8.0)

## 2020-10-09 MED ORDER — METRONIDAZOLE 0.75 % VA GEL: 1.0000 | Freq: Two times a day (BID) | VAGINAL | 1 refills | Status: DC

## 2020-10-09 MED ORDER — ACYCLOVIR 200 MG PO CAPS: 200.0000 mg | ORAL_CAPSULE | Freq: Every day | ORAL | 1 refills | Status: DC

## 2020-10-09 MED ORDER — ATENOLOL 50 MG PO TABS: 50.0000 mg | ORAL_TABLET | Freq: Every day | ORAL | 1 refills | Status: DC

## 2020-10-09 MED ORDER — PAROXETINE HCL 20 MG PO TABS: 20.0000 mg | ORAL_TABLET | Freq: Every day | ORAL | 1 refills | Status: DC

## 2020-10-09 MED ORDER — SITAGLIPTIN PHOSPHATE 100 MG PO TABS: 100.0000 mg | ORAL_TABLET | Freq: Every day | ORAL | 1 refills | Status: DC

## 2020-10-09 MED ORDER — OMEPRAZOLE 20 MG PO CPDR: 20.0000 mg | DELAYED_RELEASE_CAPSULE | Freq: Every day | ORAL | 1 refills | Status: DC

## 2020-10-09 MED ORDER — METFORMIN HCL 1000 MG PO TABS: 1000.0000 mg | ORAL_TABLET | Freq: Two times a day (BID) | ORAL | 1 refills | Status: DC

## 2020-10-09 NOTE — Progress Notes (Signed)
Date:  10/09/2020   Name:  Jenna Stein   DOB:  [birth year]   MRN:  [id number]   Chief Complaint: Annual Exam (Breast exam no pap)  Jenna Stein is a 70 y.o. female who presents today for her Complete Annual Exam. She feels well. She reports exercising aerobics dance X3 times a week. She reports she is sleeping well. Breast complaints none.  Mammogram: 11/2019 scheduled 11/2020 DEXA: 10/2016 osteopenia - scheduled 11/2020 Pap smear: discontinued Colonoscopy: 07/2011 - due this year  Immunization History  Administered Date(s) Administered  . Influenza Split 04/18/2011, 05/24/2012  . Influenza, High Dose Seasonal PF 07/03/2017, 04/29/2018  . Influenza,inj,Quad PF,6+ Mos 05/02/2013, 05/03/2014, 05/09/2015  . Influenza,inj,quad, With Preservative 07/18/2016  . Influenza-Unspecified 04/18/2017, 05/03/2018, 05/18/2020  . PFIZER(Purple Top)SARS-COV-2 Vaccination 09/27/2019, 10/18/2019, 04/18/2020  . Pneumococcal Conjugate-13 10/10/2016  . Pneumococcal Polysaccharide-23 08/07/2017  . Td 08/04/2003  . Tdap 08/04/2003, 04/18/2011  . Zoster 01/12/2013  . Zoster Recombinat (Shingrix) 04/29/2018, 07/29/2018    Diabetes She presents for her follow-up diabetic visit. She has type 2 diabetes mellitus. Her disease course has been stable. Pertinent negatives for hypoglycemia include no dizziness, headaches, nervousness/anxiousness or tremors. Pertinent negatives for diabetes include no chest pain, no fatigue, no polydipsia and no polyuria. Pertinent negatives for diabetic complications include no CVA. Current diabetic treatment includes oral agent (dual therapy) Celesta Gentile and metformin). She is compliant with treatment all of the time. There is no change in her home blood glucose trend. An ACE inhibitor/angiotensin II receptor blocker is being taken.  Hyperlipidemia This is a chronic problem. The problem is controlled. Pertinent negatives include no chest pain or shortness of breath. Current  antihyperlipidemic treatment includes statins. The current treatment provides significant improvement of lipids.  Hypertension This is a chronic problem. The problem is controlled. Pertinent negatives include no chest pain, headaches, palpitations or shortness of breath. Past treatments include angiotensin blockers, diuretics and beta blockers. The current treatment provides significant improvement. There is no history of kidney disease, CAD/MI or CVA.  Depression        This is a chronic problem.The problem is unchanged.  Associated symptoms include no fatigue and no headaches.  Past treatments include SSRIs - Selective serotonin reuptake inhibitors.  Compliance with treatment is good. Gastroesophageal Reflux She complains of heartburn. She reports no abdominal pain, no chest pain, no coughing or no wheezing. This is a recurrent problem. The problem occurs frequently. Pertinent negatives include no fatigue. She has tried a PPI for the symptoms. The treatment provided moderate relief.  Vaginal Discharge The patient's primary symptoms include genital itching. The patient's pertinent negatives include no vaginal discharge. This is a recurrent problem. The problem occurs intermittently. The patient is experiencing no pain. The problem affects both sides. She is not pregnant. Pertinent negatives include no abdominal pain, chills, constipation, diarrhea, dysuria, fever, frequency, headaches, rash or vomiting. Nothing aggravates the symptoms. She has tried antibiotics for the symptoms. The treatment provided mild relief. She is not sexually active.    Lab Results  Component Value Date   CREATININE 0.84 06/04/2020   BUN 9 06/04/2020   NA 136 06/04/2020   K 4.2 06/04/2020   CL 97 06/04/2020   CO2 25 06/04/2020   Lab Results  Component Value Date   CHOL 174 10/07/2019   HDL 53 10/07/2019   LDLCALC 90 10/07/2019   TRIG 184 (H) 10/07/2019   CHOLHDL 3.3 10/07/2019   Lab Results  Component Value Date  TSH 2.740 10/07/2019   Lab Results  Component Value Date   HGBA1C 5.8 (H) 06/04/2020   Lab Results  Component Value Date   WBC 9.3 10/07/2019   HGB 14.1 10/07/2019   HCT 41.1 10/07/2019   MCV 89 10/07/2019   PLT 317 10/07/2019   Lab Results  Component Value Date   ALT 25 06/04/2020   AST 25 06/04/2020   ALKPHOS 59 06/04/2020   BILITOT 0.6 06/04/2020     Review of Systems  Constitutional: Negative for chills, fatigue and fever.  HENT: Negative for congestion, hearing loss, tinnitus, trouble swallowing and voice change.   Eyes: Negative for visual disturbance.  Respiratory: Negative for cough, chest tightness, shortness of breath and wheezing.   Cardiovascular: Negative for chest pain, palpitations and leg swelling.  Gastrointestinal: Positive for heartburn. Negative for abdominal pain, constipation, diarrhea and vomiting.       Reflux  Endocrine: Negative for polydipsia and polyuria.  Genitourinary: Negative for dysuria, frequency, genital sores, vaginal bleeding and vaginal discharge.  Musculoskeletal: Negative for arthralgias, gait problem and joint swelling.  Skin: Negative for color change and rash.  Neurological: Negative for dizziness, tremors, light-headedness and headaches.  Hematological: Negative for adenopathy. Does not bruise/bleed easily.  Psychiatric/Behavioral: Positive for depression. Negative for dysphoric mood and sleep disturbance. The patient is not nervous/anxious.     Patient Active Problem List   Diagnosis Date Noted  . Gastroesophageal reflux disease 08/13/2018  . Slow transit constipation 08/13/2018  . Type II diabetes mellitus with complication ([place]) 40/98/1191  . Dry mouth 10/10/2016  . Ovarian failure 10/10/2016  . Allergic rhinitis, seasonal 04/30/2015  . Hepatic steatosis 04/30/2015  . Osteoarthritis of both hands 04/30/2015  . Genital herpes 04/04/2009  . Depression, major, single episode, in partial remission ([place]) 08/18/1998  .  Essential hypertension 08/18/1998  . Hyperlipidemia associated with type 2 diabetes mellitus ([place]) 08/18/1998  . Primary insomnia 08/18/1998    Allergies  Allergen Reactions  . Clonazepam Other (See Comments)    Nocturnal enuresis; Lethargy   . Trazodone Anxiety and Other (See Comments)    Dizziness     Past Surgical History:  Procedure Laterality Date  . CYST EXCISION Left 2017   foot  . HAND SURGERY  07/2012    Social History   Tobacco Use  . Smoking status: Never Smoker  . Smokeless tobacco: Never Used  . Tobacco comment: smoking cessation materials not required  Vaping Use  . Vaping Use: Never used  Substance Use Topics  . Alcohol use: No  . Drug use: No     Medication list has been reviewed and updated.  Current Meds  Medication Sig  . atorvastatin (LIPITOR) 10 MG tablet Take 1 tablet (10 mg total) by mouth at bedtime.  . Bilberry 100 MG CAPS Take by mouth.  . celecoxib (CELEBREX) 200 MG capsule TAKE 1 CAPSULE BY MOUTH EVERY DAY  . Coenzyme Q10 (CO Q 10) 100 MG CAPS Take 2 capsules by mouth daily.   [name] estazolam (PROSOM) 2 MG tablet TAKE 1 TABLET (2 MG TOTAL) BY MOUTH AT BEDTIME.  [name] glucose blood (FREESTYLE LITE) test strip Use to test BS twice daily  . hydrochlorothiazide (HYDRODIURIL) 12.5 MG tablet Take 1 tablet (12.5 mg total) by mouth daily.  . Lancets (FREESTYLE) lancets Use to test BS once daily  . Lutein 20 MG TABS Take 1 tablet by mouth daily.   . metroNIDAZOLE (METROGEL VAGINAL) 0.75 % vaginal gel Place 1 Applicatorful vaginally 2 (  two) times daily. For 5 days for recurrence of vaginitis  . MULTIPLE VITAMIN PO 1 tablet daily.   [name] olopatadine (PATANOL) 0.1 % ophthalmic solution Apply to eye.  . Omega-3 Fatty Acids (FISH OIL PEARLS PO) Take by mouth.  . triamcinolone cream (KENALOG) 0.5 % APPLY 1 APPLICATION TOPICALLY 3 (THREE) TIMES DAILY. TO RASH ON ABDOMEN  . TURMERIC PO Take 800 mg by mouth.  . valsartan (DIOVAN) 160 MG tablet Take 1 tablet (160  mg total) by mouth daily.  . vitamin E (VITAMIN E) 400 UNIT capsule Take 1 capsule (400 Units total) by mouth daily.  . [DISCONTINUED] acyclovir (ZOVIRAX) 200 MG capsule Take 1 capsule (200 mg total) by mouth 5 (five) times daily.  . [DISCONTINUED] atenolol (TENORMIN) 50 MG tablet Take 1 tablet (50 mg total) by mouth daily.  . [DISCONTINUED] metFORMIN (GLUCOPHAGE) 1000 MG tablet Take 1 tablet (1,000 mg total) by mouth 2 (two) times daily with a meal.  . [DISCONTINUED] omeprazole (PRILOSEC) 20 MG capsule Take 1 capsule (20 mg total) by mouth daily.  . [DISCONTINUED] PARoxetine (PAXIL) 20 MG tablet Take 1 tablet (20 mg total) by mouth daily.  . [DISCONTINUED] sitaGLIPtin (JANUVIA) 100 MG tablet Take 1 tablet (100 mg total) by mouth daily.    PHQ 2/9 Scores 10/09/2020 09/10/2020 06/04/2020 02/01/2020  PHQ - 2 Score 0 0 0 0  PHQ- 9 Score 0 - 0 0    GAD 7 : Generalized Anxiety Score 10/09/2020 06/04/2020 02/01/2020 08/16/2019  Nervous, Anxious, on Edge 0 0 1 0  Control/stop worrying 0 0 2 0  Worry too much - different things 0 0 2 2  Trouble relaxing 0 0 0 0  Restless 0 0 0 0  Easily annoyed or irritable 0 0 0 0  Afraid - awful might happen 0 0 3 1  Total GAD 7 Score 0 0 8 3  Anxiety Difficulty - Not difficult at all Not difficult at all Not difficult at all    BP Readings from Last 3 Encounters:  10/09/20 (!) 142/78  06/04/20 124/70  02/01/20 120/68    Physical Exam Vitals and nursing note reviewed.  Constitutional:      General: She is not in acute distress.    Appearance: She is well-developed.  HENT:     Head: Normocephalic and atraumatic.     Right Ear: Tympanic membrane and ear canal normal.     Left Ear: Tympanic membrane and ear canal normal.     Nose:     Right Sinus: No maxillary sinus tenderness.     Left Sinus: No maxillary sinus tenderness.  Eyes:     General: No scleral icterus.       Right eye: No discharge.        Left eye: No discharge.     Conjunctiva/sclera:  Conjunctivae normal.  Neck:     Thyroid: No thyromegaly.     Vascular: No carotid bruit.  Cardiovascular:     Rate and Rhythm: Normal rate and regular rhythm.     Pulses: Normal pulses.     Heart sounds: Normal heart sounds.  Pulmonary:     Effort: Pulmonary effort is normal. No respiratory distress.     Breath sounds: No wheezing.  Chest:  Breasts:     Right: No mass, nipple discharge, skin change or tenderness.     Left: No mass, nipple discharge, skin change or tenderness.    Abdominal:     General: Bowel sounds are normal.  Palpations: Abdomen is soft.     Tenderness: There is no abdominal tenderness.  Musculoskeletal:     Cervical back: Normal range of motion. No erythema.     Right lower leg: No edema.     Left lower leg: No edema.  Lymphadenopathy:     Cervical: No cervical adenopathy.  Skin:    General: Skin is warm and dry.     Capillary Refill: Capillary refill takes less than 2 seconds.     Findings: No rash.  Neurological:     General: No focal deficit present.     Mental Status: She is alert and oriented to person, place, and time.     Cranial Nerves: No cranial nerve deficit.     Sensory: No sensory deficit.     Deep Tendon Reflexes: Reflexes are normal and symmetric.  Psychiatric:        Attention and Perception: Attention normal.        Mood and Affect: Mood normal.     Wt Readings from Last 3 Encounters:  10/09/20 143 lb (64.9 kg)  06/04/20 142 lb (64.4 kg)  02/01/20 147 lb (66.7 kg)    BP (!) 142/78   Pulse 67   Temp 97.7 F (36.5 C) (Oral)   Ht 5' 4.5" (1.638 m)   Wt 143 lb (64.9 kg)   SpO2 97%   BMI 24.17 kg/m   Assessment and Plan: 1. Essential hypertension BP slightly elevated today - pt will continue same medication and monitor at home Re-assess next visit - CBC with Differential/Platelet - POCT urinalysis dipstick - atenolol (TENORMIN) 50 MG tablet; Take 1 tablet (50 mg total) by mouth daily.  Dispense: 90 tablet; Refill:  1  2. Type II diabetes mellitus with complication (HCC) Clinically stable by exam and report without s/s of hypoglycemia. DM complicated by HTN. Tolerating medications well without side effects or other concerns. - Comprehensive metabolic panel - Hemoglobin A1c - metFORMIN (GLUCOPHAGE) 1000 MG tablet; Take 1 tablet (1,000 mg total) by mouth 2 (two) times daily with a meal.  Dispense: 180 tablet; Refill: 1 - sitaGLIPtin (JANUVIA) 100 MG tablet; Take 1 tablet (100 mg total) by mouth daily.  Dispense: 90 tablet; Refill: 1  3. Hyperlipidemia associated with type 2 diabetes mellitus ([place]) - Lipid panel  4. Gastroesophageal reflux disease, unspecified whether esophagitis present Symptoms recurrent but only taking medication after sx start Recommend daily PPI No red flag signs such as weight loss, n/v, melena. - CBC with Differential/Platelet - omeprazole (PRILOSEC) 20 MG capsule; Take 1 capsule (20 mg total) by mouth daily.  Dispense: 90 capsule; Refill: 1  5. Depression, major, single episode, in partial remission (HCC) Clinically stable on current regimen with good control of symptoms, No SI or HI. Will continue current therapy. - TSH - PARoxetine (PAXIL) 20 MG tablet; Take 1 tablet (20 mg total) by mouth daily.  Dispense: 90 tablet; Refill: 1  6. Genital herpes simplex, unspecified site - acyclovir (ZOVIRAX) 200 MG capsule; Take 1 capsule (200 mg total) by mouth 5 (five) times daily.  Dispense: 90 capsule; Refill: 1  7. Vaginosis Suspect BV - treat presumptively and follow up if not resolved - metroNIDAZOLE (METROGEL VAGINAL) 0.75 % vaginal gel; Place 1 Applicatorful vaginally 2 (two) times daily. For 5 days for recurrence of vaginitis  Dispense: 70 g; Refill: 1   Partially dictated using Editor, commissioning. Any errors are unintentional.  Halina Maidens, MD [facility]  10/09/2020

## 2020-10-10 LAB — LIPID PANEL
Chol/HDL Ratio: 3.5 ratio (ref 0.0–4.4)
Cholesterol, Total: 165 mg/dL (ref 100–199)
HDL: 47 mg/dL (ref >39)
LDL Chol Calc (NIH): 94 mg/dL (ref 0–99)
Triglycerides: 133 mg/dL (ref 0–149)
VLDL Cholesterol Cal: 24 mg/dL (ref 5–40)

## 2020-10-10 LAB — CBC WITH DIFFERENTIAL/PLATELET
Basophils Absolute: 1.1 10*3/uL — ABNORMAL HIGH (ref 0.0–0.2)
Basos: 12 %
EOS (ABSOLUTE): 0.6 10*3/uL — ABNORMAL HIGH (ref 0.0–0.4)
Eos: 6 %
Hematocrit: 41.4 % (ref 34.0–46.6)
Hemoglobin: 13.7 g/dL (ref 11.1–15.9)
Lymphocytes Absolute: 3.7 10*3/uL — ABNORMAL HIGH (ref 0.7–3.1)
Lymphs: 39 %
MCH: 29.9 pg (ref 26.6–33.0)
MCHC: 33.1 g/dL (ref 31.5–35.7)
MCV: 90 fL (ref 79–97)
Monocytes Absolute: 0.1 10*3/uL (ref 0.1–0.9)
Monocytes: 1 %
Neutrophils Absolute: 3.7 10*3/uL (ref 1.4–7.0)
Neutrophils: 39 %
Platelets: 337 10*3/uL (ref 150–450)
RBC: 4.58 x10E6/uL (ref 3.77–5.28)
RDW: 13.2 % (ref 11.7–15.4)
WBC: 9.5 10*3/uL (ref 3.4–10.8)

## 2020-10-10 LAB — HEMOGLOBIN A1C
Est. average glucose Bld gHb Est-mCnc: 126 mg/dL
Hgb A1c MFr Bld: 6 % — ABNORMAL HIGH (ref 4.8–5.6)

## 2020-10-10 LAB — COMPREHENSIVE METABOLIC PANEL
ALT: 15 IU/L (ref 0–32)
AST: 20 IU/L (ref 0–40)
Albumin/Globulin Ratio: 1.7 (ref 1.2–2.2)
Albumin: 4.7 g/dL (ref 3.8–4.8)
Alkaline Phosphatase: 66 IU/L (ref 44–121)
BUN/Creatinine Ratio: 16 (ref 12–28)
BUN: 15 mg/dL (ref 8–27)
Bilirubin Total: 0.5 mg/dL (ref 0.0–1.2)
CO2: 22 mmol/L (ref 20–29)
Calcium: 9.5 mg/dL (ref 8.7–10.3)
Chloride: 103 mmol/L (ref 96–106)
Creatinine, Ser: 0.95 mg/dL (ref 0.57–1.00)
GFR calc Af Amer: 71 mL/min/{1.73_m2} (ref >59)
GFR calc non Af Amer: 61 mL/min/{1.73_m2} (ref >59)
Globulin, Total: 2.8 g/dL (ref 1.5–4.5)
Glucose: 122 mg/dL — ABNORMAL HIGH (ref 65–99)
Potassium: 4.4 mmol/L (ref 3.5–5.2)
Sodium: 140 mmol/L (ref 134–144)
Total Protein: 7.5 g/dL (ref 6.0–8.5)

## 2020-10-10 LAB — TSH: TSH: 1.33 u[IU]/mL (ref 0.450–4.500)

## 2020-10-10 LAB — IMMATURE CELLS: MYELOCYTES: 3 % — ABNORMAL HIGH (ref 0–0)

## 2020-10-12 ENCOUNTER — Other Ambulatory Visit: Payer: Self-pay

## 2020-10-12 DIAGNOSIS — R7989 Other specified abnormal findings of blood chemistry: Secondary | ICD-10-CM

## 2020-10-23 ENCOUNTER — Other Ambulatory Visit: Payer: Self-pay | Admitting: Internal Medicine

## 2020-10-23 DIAGNOSIS — F5101 Primary insomnia: Secondary | ICD-10-CM

## 2020-10-23 NOTE — Telephone Encounter (Signed)
Requested medications are due for refill today yes  Requested medications are on the active medication list yes  Last refill 2/7  Last visit 10/09/20 (last visit this med/dx was addressed 01/2020  Future visit scheduled yes, 01/2021  Notes to clinic Not Delegated.

## 2020-10-24 NOTE — Progress Notes (Signed)
[facility]  [address] [place], [place] [address] Phone: [phone]  Fax: [phone]   Clinic Day:  10/25/2020  Referring physician: Glean Hess, MD  Chief Complaint: Jenna Stein is a 70 y.o. female with an abnormal CBC who is referred in consultation by Dr. Halina Maidens for assessment and management.   HPI: The patient saw Dr. Army Melia on 10/09/2020 for an annual exam. Hematocrit was 41.4, hemoglobin 13.7, platelets 337,000, WBC 9,500. Myelocytes were 3%. CMP was normal. She was referred to hematology.   CBC followed: 05/07/2015:  Hematocrit 39.8, hemoglobin 13.5, platelets 296,000, WBC 6,900 (ANC 3300). 06/05/2016:  Hematocrit 37.6, hemoglobin 13.1, platelets 242,000, WBC 6,400 (ANC 3200). 08/07/2017:  Hematocrit 38.7, hemoglobin 13.3, platelets 273,000, WBC 7,000 (ANC 3500). 08/13/2018:  Hematocrit 39.3, hemoglobin 13.1, platelets 302,000, WBC 8,200 (ANC 4000). 10/07/2019:  Hematocrit 41.1, hemoglobin 14.1, platelets 317,000, WBC 9,300 (ANC 5000). 10/09/2020:  Hematocrit 41.4, hemoglobin 13.7, platelets 337,000, WBC 9,500 (ANC 3700).  Bone density in 10/2016 showed osteopenia. Bone density and mammogram are scheduled for 12/06/2020.  Symptomatically, she feels "fine." She has some right knee and right ankle pain. She has arthritis in her fingers. She has reflux.  The patient denies fevers, sweats, headaches, changes in vision, runny nose, sore throat, cough, shortness of breath, chest pain, palpitations, nausea, vomiting, diarrhea, urinary symptoms, skin changes, numbness, weakness, balance or coordination problems, and bleeding of any kind.  The patient had COVID-19 about a month ago. Her husband tested positive with an at-home rapid test. The patient never tested herself but they had the same symptoms. They both had a sore throat, fatigue, and coughing for about 2 weeks. They both had negative PCR tests 2-3 weeks ago.  Her sister  had breast cancer in her 37s. She denies a family history of blood disorders.   Past Medical History:  Diagnosis Date  . Allergy   . Anxiety   . Arthritis 2010  . Depression   . Diabetes mellitus without complication ([place])   . GERD (gastroesophageal reflux disease)   . Hyperlipidemia   . Hypertension     Past Surgical History:  Procedure Laterality Date  . CYST EXCISION Left 2017   foot  . HAND SURGERY  07/2012    Family History  Problem Relation Age of Onset  . Hypertension Mother   . Hyperlipidemia Mother   . Arthritis Mother   . Parkinsonism Father   . Arthritis Other   . Hypertension Other   . Hyperlipidemia Other   . Hypothyroidism Other   . Breast cancer Sister 37  . Cancer Sister     Social History:  reports that she has never smoked. She has never used smokeless tobacco. She reports that she does not drink alcohol and does not use drugs. She denies tobacco and alcohol use. She denies any exposure to radiation or toxins. She retired 20 years ago. 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 Her daughter and granddaughter live in [place]. She is originally from [place] and her sister and brother still live there. Her parents are from [place].The patient is alone today.  Allergies:  Allergies  Allergen Reactions  . Clonazepam Other (See Comments)    Nocturnal enuresis; Lethargy   . Trazodone Anxiety and Other (See Comments)    Dizziness     Current Medications: Current Outpatient Medications  Medication Sig Dispense Refill  . acyclovir (ZOVIRAX) 200 MG capsule Take 1 capsule (200 mg total) by mouth 5 (  five) times daily. 90 capsule 1  . atenolol (TENORMIN) 50 MG tablet Take 1 tablet (50 mg total) by mouth daily. 90 tablet 1  . atorvastatin (LIPITOR) 10 MG tablet Take 1 tablet (10 mg total) by mouth at bedtime. 90 tablet 1  . Bilberry 100 MG CAPS Take by mouth.    . celecoxib (CELEBREX) 200 MG capsule TAKE 1 CAPSULE BY MOUTH EVERY DAY 90 capsule 3  . Coenzyme  Q10 (CO Q 10) 100 MG CAPS Take 2 capsules by mouth daily.     [name] estazolam (PROSOM) 2 MG tablet TAKE 1 TABLET (2 MG TOTAL) BY MOUTH AT BEDTIME. 30 tablet 5  . glucose blood (FREESTYLE LITE) test strip Use to test BS twice daily 100 each 12  . hydrochlorothiazide (HYDRODIURIL) 12.5 MG tablet Take 1 tablet (12.5 mg total) by mouth daily. 90 tablet 3  . Lancets (FREESTYLE) lancets Use to test BS once daily 100 each 12  . Lutein 20 MG TABS Take 1 tablet by mouth daily.     . metFORMIN (GLUCOPHAGE) 1000 MG tablet Take 1 tablet (1,000 mg total) by mouth 2 (two) times daily with a meal. 180 tablet 1  . metroNIDAZOLE (METROGEL VAGINAL) 0.75 % vaginal gel Place 1 Applicatorful vaginally 2 (two) times daily. For 5 days for recurrence of vaginitis 70 g 1  . MULTIPLE VITAMIN PO 1 tablet daily.     [name] olopatadine (PATANOL) 0.1 % ophthalmic solution Apply to eye.    . Omega-3 Fatty Acids (FISH OIL PEARLS PO) Take by mouth.    [name] omeprazole (PRILOSEC) 20 MG capsule Take 1 capsule (20 mg total) by mouth daily. 90 capsule 1  . PARoxetine (PAXIL) 20 MG tablet Take 1 tablet (20 mg total) by mouth daily. 90 tablet 1  . sitaGLIPtin (JANUVIA) 100 MG tablet Take 1 tablet (100 mg total) by mouth daily. 90 tablet 1  . triamcinolone cream (KENALOG) 0.5 % APPLY 1 APPLICATION TOPICALLY 3 (THREE) TIMES DAILY. TO RASH ON ABDOMEN 30 g 0  . TURMERIC PO Take 800 mg by mouth.    . valsartan (DIOVAN) 160 MG tablet Take 1 tablet (160 mg total) by mouth daily. 90 tablet 1  . vitamin E (VITAMIN E) 400 UNIT capsule Take 1 capsule (400 Units total) by mouth daily. 1 capsule 0   No current facility-administered medications for this visit.    Review of Systems  Constitutional: Negative for chills, diaphoresis, fever, malaise/fatigue and weight loss.  HENT: Negative for congestion, ear discharge, ear pain, hearing loss, nosebleeds, sinus pain, sore throat and tinnitus.   Eyes: Negative for blurred vision.  Respiratory: Negative for  cough, hemoptysis, sputum production and shortness of breath.   Cardiovascular: Negative for chest pain, palpitations and leg swelling.  Gastrointestinal: Positive for heartburn. Negative for abdominal pain, blood in stool, constipation, diarrhea, melena, nausea and vomiting.  Genitourinary: Negative for dysuria, frequency, hematuria and urgency.  Musculoskeletal: Positive for joint pain (right knee and ankle. Arthritis in fingers). Negative for back pain, myalgias and neck pain.  Skin: Negative for itching and rash.  Neurological: Negative for dizziness, tingling, sensory change, weakness and headaches.  Endo/Heme/Allergies: Does not bruise/bleed easily.  Psychiatric/Behavioral: Negative for depression and memory loss. The patient is not nervous/anxious and does not have insomnia.   All other systems reviewed and are negative.  Performance status (ECOG): 0  Vitals Blood pressure 139/72, pulse 72, temperature (!) 97.4 F (36.3 C), temperature source Tympanic, weight 142 lb 10.2 oz (64.7 kg),  SpO2 98 %.   Physical Exam Vitals and nursing note reviewed.  Constitutional:      General: She is not in acute distress.    Appearance: She is not diaphoretic.  HENT:     Head: Normocephalic and atraumatic.     Comments: Long dar hair.    Mouth/Throat:     Mouth: Mucous membranes are moist.     Pharynx: Oropharynx is clear.  Eyes:     General: No scleral icterus.    Extraocular Movements: Extraocular movements intact.     Conjunctiva/sclera: Conjunctivae normal.     Pupils: Pupils are equal, round, and reactive to light.     Comments: Glasses.  Brown eyes.  Cardiovascular:     Rate and Rhythm: Normal rate and regular rhythm.     Heart sounds: Normal heart sounds. No murmur heard.   Pulmonary:     Effort: Pulmonary effort is normal. No respiratory distress.     Breath sounds: Normal breath sounds. No wheezing or rales.  Chest:     Chest wall: No tenderness.  Breasts:     Right: No  axillary adenopathy or supraclavicular adenopathy.     Left: No axillary adenopathy or supraclavicular adenopathy.    Abdominal:     General: Bowel sounds are normal. There is no distension.     Palpations: Abdomen is soft. There is no hepatomegaly, splenomegaly or mass.     Tenderness: There is no abdominal tenderness. There is no guarding or rebound.  Musculoskeletal:        General: No swelling or tenderness. Normal range of motion.     Cervical back: Normal range of motion and neck supple.  Lymphadenopathy:     Head:     Right side of head: No preauricular, posterior auricular or occipital adenopathy.     Left side of head: No preauricular, posterior auricular or occipital adenopathy.     Cervical: No cervical adenopathy.     Upper Body:     Right upper body: No supraclavicular or axillary adenopathy.     Left upper body: No supraclavicular or axillary adenopathy.     Lower Body: No right inguinal adenopathy. No left inguinal adenopathy.  Skin:    General: Skin is warm and dry.  Neurological:     Mental Status: She is alert and oriented to person, place, and time.  Psychiatric:        Behavior: Behavior normal.        Thought Content: Thought content normal.        Judgment: Judgment normal.    No visits with results within 3 Day(s) from this visit.  Latest known visit with results is:  Office Visit on 10/09/2020  Component Date Value Ref Range Status  . WBC 10/09/2020 9.5  3.4 - 10.8 x10E3/uL Final  . RBC 10/09/2020 4.58  3.77 - 5.28 x10E6/uL Final  . Hemoglobin 10/09/2020 13.7  11.1 - 15.9 g/dL Final  . Hematocrit 10/09/2020 41.4  34.0 - 46.6 % Final  . MCV 10/09/2020 90  79 - 97 fL Final  . MCH 10/09/2020 29.9  26.6 - 33.0 pg Final  . MCHC 10/09/2020 33.1  31.5 - 35.7 g/dL Final  . RDW 10/09/2020 13.2  11.7 - 15.4 % Final  . Platelets 10/09/2020 337  150 - 450 x10E3/uL Final  . Neutrophils 10/09/2020 39  Not Estab. % Final  . Lymphs 10/09/2020 39  Not Estab. % Final   . Monocytes 10/09/2020 1  Not Estab. %  Final  . Eos 10/09/2020 6  Not Estab. % Final  . Basos 10/09/2020 12  Not Estab. % Final  . Immature Cells 10/09/2020 Note   Final  . Neutrophils Absolute 10/09/2020 3.7  1.4 - 7.0 x10E3/uL Final  . Lymphocytes Absolute 10/09/2020 3.7* 0.7 - 3.1 x10E3/uL Final  . Monocytes Absolute 10/09/2020 0.1  0.1 - 0.9 x10E3/uL Final  . EOS (ABSOLUTE) 10/09/2020 0.6* 0.0 - 0.4 x10E3/uL Final  . Basophils Absolute 10/09/2020 1.1* 0.0 - 0.2 x10E3/uL Final  . Hematology Comments: 10/09/2020 Note:   Final   Comment: Manual differential was performed. Observations confirmed by smear review. (Performed by [employer])   . Glucose 10/09/2020 122* 65 - 99 mg/dL Final  . BUN 10/09/2020 15  8 - 27 mg/dL Final  . Creatinine, Ser 10/09/2020 0.95  0.57 - 1.00 mg/dL Final   Comment:                **Effective October 15, 2020 [facility] will begin**                  reporting the 2021 CKD-EPI creatinine equation that                  estimates kidney function without a race variable.   [name] GFR calc non Af Amer 10/09/2020 61  >59 mL/min/1.73 Final  . GFR calc Af Amer 10/09/2020 71  >59 mL/min/1.73 Final   Comment: **In accordance with recommendations from the NKF-ASN Task force,**   [facility] is in the process of updating its eGFR calculation to the   2021 CKD-EPI creatinine equation that estimates kidney function   without a race variable.   . BUN/Creatinine Ratio 10/09/2020 16  12 - 28 Final  . Sodium 10/09/2020 140  134 - 144 mmol/L Final  . Potassium 10/09/2020 4.4  3.5 - 5.2 mmol/L Final  . Chloride 10/09/2020 103  96 - 106 mmol/L Final  . CO2 10/09/2020 22  20 - 29 mmol/L Final  . Calcium 10/09/2020 9.5  8.7 - 10.3 mg/dL Final  . Total Protein 10/09/2020 7.5  6.0 - 8.5 g/dL Final  . Albumin 10/09/2020 4.7  3.8 - 4.8 g/dL Final  . Globulin, Total 10/09/2020 2.8  1.5 - 4.5 g/dL Final  . Albumin/Globulin Ratio 10/09/2020 1.7  1.2 - 2.2 Final  .  Bilirubin Total 10/09/2020 0.5  0.0 - 1.2 mg/dL Final  . Alkaline Phosphatase 10/09/2020 66  44 - 121 IU/L Final  . AST 10/09/2020 20  0 - 40 IU/L Final  . ALT 10/09/2020 15  0 - 32 IU/L Final  . Hgb A1c MFr Bld 10/09/2020 6.0* 4.8 - 5.6 % Final   Comment:          Prediabetes: 5.7 - 6.4          Diabetes: >6.4          Glycemic control for adults with diabetes: <7.0   . Est. average glucose Bld gHb Est-m* 10/09/2020 126  mg/dL Final  . Cholesterol, Total 10/09/2020 165  100 - 199 mg/dL Final  . Triglycerides 10/09/2020 133  0 - 149 mg/dL Final  . HDL 10/09/2020 47  >39 mg/dL Final  . VLDL Cholesterol Cal 10/09/2020 24  5 - 40 mg/dL Final  . LDL Chol Calc (NIH) 10/09/2020 94  0 - 99 mg/dL Final  . Chol/HDL Ratio 10/09/2020 3.5  0.0 - 4.4 ratio Final   Comment:  T. Chol/HDL Ratio                                             Men  Women                               1/2 Avg.Risk  3.4    3.3                                   Avg.Risk  5.0    4.4                                2X Avg.Risk  9.6    7.1                                3X Avg.Risk 23.4   11.0   . TSH 10/09/2020 1.330  0.450 - 4.500 uIU/mL Final  . Color, UA 10/09/2020 [place]   Final  . Clarity, UA 10/09/2020 clear   Final  . Glucose, UA 10/09/2020 Negative  Negative Final  . Bilirubin, UA 10/09/2020 neg   Final  . Ketones, UA 10/09/2020 neg   Final  . Spec Grav, UA 10/09/2020 <=1.005* 1.010 - 1.025 Final  . Blood, UA 10/09/2020 trace(non hem)   Final  . pH, UA 10/09/2020 7.5  5.0 - 8.0 Final  . Protein, UA 10/09/2020 Negative  Negative Final  . Urobilinogen, UA 10/09/2020 0.2  0.2 or 1.0 E.U./dL Final  . Nitrite, UA 10/09/2020 neg   Final  . Leukocytes, UA 10/09/2020 Negative  Negative Final  . Appearance 10/09/2020 clear   Final  . Odor 10/09/2020 none   Final  . MYELOCYTES 10/09/2020 3* 0 - 0 % Final    Assessment:  Jenna Stein is a 70 y.o. female with an abnormal peripheral smear.   There were 3% myelocytes on the CBC on 10/09/2020.  The patient received the [place] COVID-19 vaccine on 09/27/2019 and 10/18/2019. She received the [employer] on 04/18/2020.  She feels that she had COVID-19 about a month ago. Her husband tested positive with an at-home rapid test. The patient never tested herself but they had the same symptoms.  They both had negative PCR tests 2-3 weeks ago.  Symptomatically, she feels "fine." She has some right knee and right ankle pain. She has arthritis in her fingers.  She denies any fevers, sweats or weight loss.  Exam reveals no adenopathy or hepatosplenomegaly.  Plan: 1.   Labs today:  CBC with diff, LDH, uric acid, flow cytometry, BCR-ABL. 2.   Peripheral smear for path review. 3.   Increased myelocytes  Etiology unclear.  Differential includes: infection, leukemoid reaction, recovery from myelosuppression, CML.  No blasts seen on prior CBC suggesting leukemia.  No nucleated RBCs or teardrop cells seen on prior CBC suggesting marrow fibrosis/infiltration.  Discuss laboratory work-up.  4.   RTC in 1 week for MD assessment, review of work-up and discussion regarding direction of therapy.     I discussed the assessment and treatment plan with the patient.  The patient was provided an opportunity to ask questions and all  were answered.  The patient agreed with the plan and demonstrated an understanding of the instructions.  The patient was advised to call back if the symptoms worsen or if the condition fails to improve as anticipated.  I provided 24 minutes of face-to-face time during this this encounter and > 50% was spent counseling as documented under my assessment and plan. An additional 10 minutes were spent reviewing her chart (Epic and Care Everywhere) including notes, labs, and imaging studies.    Tiran Sauseda C. Mike Gip, MD, PhD    10/25/2020, 3:13 PM  I, Mirian Mo Tufford, am acting as Education administrator for [employer]. Mike Gip, MD, PhD.  I, Elonda Giuliano C.  Mike Gip, MD, have reviewed the above documentation for accuracy and completeness, and I agree with the above.

## 2020-10-25 ENCOUNTER — Encounter: Payer: Self-pay | Admitting: Hematology and Oncology

## 2020-10-25 ENCOUNTER — Other Ambulatory Visit: Payer: Self-pay

## 2020-10-25 ENCOUNTER — Inpatient Hospital Stay: Payer: Medicare Other

## 2020-10-25 ENCOUNTER — Inpatient Hospital Stay: Payer: Medicare Other | Attending: Hematology and Oncology | Admitting: Hematology and Oncology

## 2020-10-25 VITALS — BP 139/72 | HR 72 | Temp 97.4°F | Wt 142.6 lb

## 2020-10-25 DIAGNOSIS — M8580 Other specified disorders of bone density and structure, unspecified site: Secondary | ICD-10-CM

## 2020-10-25 DIAGNOSIS — R799 Abnormal finding of blood chemistry, unspecified: Secondary | ICD-10-CM

## 2020-10-25 DIAGNOSIS — Z803 Family history of malignant neoplasm of breast: Secondary | ICD-10-CM | POA: Diagnosis not present

## 2020-10-25 DIAGNOSIS — C9210 Chronic myeloid leukemia, BCR/ABL-positive, not having achieved remission: Secondary | ICD-10-CM | POA: Diagnosis not present

## 2020-10-25 LAB — CBC WITH DIFFERENTIAL/PLATELET
Abs Immature Granulocytes: 0.32 10*3/uL — ABNORMAL HIGH (ref 0.00–0.07)
Basophils Absolute: 0.7 10*3/uL — ABNORMAL HIGH (ref 0.0–0.1)
Basophils Relative: 7 %
Eosinophils Absolute: 0.4 10*3/uL (ref 0.0–0.5)
Eosinophils Relative: 4 %
HCT: 40.8 % (ref 36.0–46.0)
Hemoglobin: 13.7 g/dL (ref 12.0–15.0)
Immature Granulocytes: 3 %
Lymphocytes Relative: 29 %
Lymphs Abs: 2.9 10*3/uL (ref 0.7–4.0)
MCH: 29.6 pg (ref 26.0–34.0)
MCHC: 33.6 g/dL (ref 30.0–36.0)
MCV: 88.1 fL (ref 80.0–100.0)
Monocytes Absolute: 0.6 10*3/uL (ref 0.1–1.0)
Monocytes Relative: 6 %
Neutro Abs: 5.2 10*3/uL (ref 1.7–7.7)
Neutrophils Relative %: 51 %
Platelets: 352 10*3/uL (ref 150–400)
RBC Morphology: NONE SEEN
RBC: 4.63 MIL/uL (ref 3.87–5.11)
RDW: 13 % (ref 11.5–15.5)
WBC: 10.2 10*3/uL (ref 4.0–10.5)
nRBC: 0 % (ref 0.0–0.2)

## 2020-10-25 LAB — LACTATE DEHYDROGENASE: LDH: 174 U/L (ref 98–192)

## 2020-10-26 LAB — PATHOLOGIST SMEAR REVIEW

## 2020-10-27 ENCOUNTER — Encounter: Payer: Self-pay | Admitting: Internal Medicine

## 2020-10-28 ENCOUNTER — Other Ambulatory Visit: Payer: Self-pay | Admitting: Internal Medicine

## 2020-10-28 DIAGNOSIS — I10 Essential (primary) hypertension: Secondary | ICD-10-CM

## 2020-10-28 MED ORDER — VALSARTAN 160 MG PO TABS: 160.0000 mg | ORAL_TABLET | Freq: Every day | ORAL | 1 refills | Status: DC

## 2020-10-29 LAB — COMP PANEL: LEUKEMIA/LYMPHOMA

## 2020-10-29 LAB — BCR-ABL1 FISH
Cells Analyzed: 100
Cells Counted: 100

## 2020-10-31 NOTE — Progress Notes (Signed)
[facility]  [address] [place], [place] [address] Phone: [phone]  Fax: [phone]   Clinic Day:  11/01/2020  Referring physician: Glean Hess, MD  Chief Complaint: Jenna Stein is a 70 y.o. female with increased myelocytes who is seen for review of work-up and discussion regarding direction of therapy.  HPI: The patient was last seen in the hematology clinic on 10/25/2020 for new patient assessment. At that time, she felt "fine".  She denied any B symptoms.  She noted testing positive for COVID-19 about 1 month prior.  Exam revealed no adenopathy or hepatosplenomegaly.    Work-up revealed a hematocrit of 40.8, hemoglobin 13.7, platelets 352,000, WBC 10,200. LDH was 174.  BCR-ABL showed 70% of nuclei positive for BCR/ABL1 fusion.  Flow cytometry revealed left shifted aberrant neutrophils, aberrant monocytes and absolute basophilia. These results could be c/w a myeloproliferative process. Correlation with available clinical, laboratory, and morphologic data was recommended.   Peripheral smear revealed a high normal WBC count, with mild left shift and slight basophilia. No circulating blasts were identified. There were normocytic erythrocytes with unremarkable morphology. Platelet count and morphology were normal. The cause of the patient's left shift and mild basophilia was unclear from morphologic review alone. The overall picture did not appear typical for a myeloproliferative process, such as CML. Other potential causes of leukocytosis should be considered, including acute infection, inflammatory response, drug reaction, and/or stress. Clinical correlation was recommended.  During the interim, she has been "fine." Her fasting blood sugar was high this morning at 146. Blood sugar averages about 110. Her arthritis and heartburn are stable. She denies fevers, sweats, lumps, and bumps.  She agrees to a bone marrow aspirate and biopsy.  We  discussed an abdominal ultrasound. Clinically, she has no splenomegaly.   Past Medical History:  Diagnosis Date  . Allergy   . Anxiety   . Arthritis 2010  . Depression   . Diabetes mellitus without complication ([place])   . GERD (gastroesophageal reflux disease)   . Hyperlipidemia   . Hypertension     Past Surgical History:  Procedure Laterality Date  . CYST EXCISION Left 2017   foot  . HAND SURGERY  07/2012    Family History  Problem Relation Age of Onset  . Hypertension Mother   . Hyperlipidemia Mother   . Arthritis Mother   . Parkinsonism Father   . Arthritis Other   . Hypertension Other   . Hyperlipidemia Other   . Hypothyroidism Other   . Breast cancer Sister 29  . Cancer Sister     Social History:  reports that she has never smoked. She has never used smokeless tobacco. She reports that she does not drink alcohol and does not use drugs. She denies tobacco and alcohol use. She denies any exposure to radiation or toxins. She retired 20 years ago. 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 Her daughter and granddaughter live in [place]. She is originally from [place] and her sister and brother still live there. Her parents are from [place]. Her husband is [employer] fast [employer]. The patient is accompanied by [facility] today.  Allergies:  Allergies  Allergen Reactions  . Clonazepam Other (See Comments)    Nocturnal enuresis; Lethargy   . Trazodone Anxiety and Other (See Comments)    Dizziness     Current Medications: Current Outpatient Medications  Medication Sig Dispense Refill  . acyclovir (ZOVIRAX) 200 MG capsule Take 1 capsule (200 mg total) by mouth 5 (  five) times daily. 90 capsule 1  . atenolol (TENORMIN) 50 MG tablet Take 1 tablet (50 mg total) by mouth daily. 90 tablet 1  . atorvastatin (LIPITOR) 10 MG tablet Take 1 tablet (10 mg total) by mouth at bedtime. 90 tablet 1  . Bilberry 100 MG CAPS Take by mouth.    . celecoxib (CELEBREX) 200 MG capsule TAKE 1 CAPSULE BY MOUTH EVERY  DAY 90 capsule 3  . Coenzyme Q10 (CO Q 10) 100 MG CAPS Take 2 capsules by mouth daily.     [name] estazolam (PROSOM) 2 MG tablet TAKE 1 TABLET (2 MG TOTAL) BY MOUTH AT BEDTIME. 30 tablet 5  . glucose blood (FREESTYLE LITE) test strip Use to test BS twice daily 100 each 12  . hydrochlorothiazide (HYDRODIURIL) 12.5 MG tablet Take 1 tablet (12.5 mg total) by mouth daily. 90 tablet 3  . Lancets (FREESTYLE) lancets Use to test BS once daily 100 each 12  . Lutein 20 MG TABS Take 1 tablet by mouth daily.     . metFORMIN (GLUCOPHAGE) 1000 MG tablet Take 1 tablet (1,000 mg total) by mouth 2 (two) times daily with a meal. 180 tablet 1  . metroNIDAZOLE (METROGEL VAGINAL) 0.75 % vaginal gel Place 1 Applicatorful vaginally 2 (two) times daily. For 5 days for recurrence of vaginitis 70 g 1  . MULTIPLE VITAMIN PO 1 tablet daily.     [name] olopatadine (PATANOL) 0.1 % ophthalmic solution Apply to eye.    . Omega-3 Fatty Acids (FISH OIL PEARLS PO) Take by mouth.    [name] omeprazole (PRILOSEC) 20 MG capsule Take 1 capsule (20 mg total) by mouth daily. 90 capsule 1  . PARoxetine (PAXIL) 20 MG tablet Take 1 tablet (20 mg total) by mouth daily. 90 tablet 1  . sitaGLIPtin (JANUVIA) 100 MG tablet Take 1 tablet (100 mg total) by mouth daily. 90 tablet 1  . triamcinolone cream (KENALOG) 0.5 % APPLY 1 APPLICATION TOPICALLY 3 (THREE) TIMES DAILY. TO RASH ON ABDOMEN 30 g 0  . TURMERIC PO Take 800 mg by mouth.    . valsartan (DIOVAN) 160 MG tablet Take 1 tablet (160 mg total) by mouth daily. 90 tablet 1  . vitamin E (VITAMIN E) 400 UNIT capsule Take 1 capsule (400 Units total) by mouth daily. 1 capsule 0   No current facility-administered medications for this visit.    Review of Systems  Constitutional: Negative for chills, diaphoresis, fever, malaise/fatigue and weight loss (up 2 lbs).       Feels "fine."  HENT: Negative for congestion, ear discharge, ear pain, hearing loss, nosebleeds, sinus pain, sore throat and tinnitus.    Eyes: Negative for blurred vision.  Respiratory: Negative for cough, hemoptysis, sputum production and shortness of breath.   Cardiovascular: Negative for chest pain, palpitations and leg swelling.  Gastrointestinal: Positive for heartburn. Negative for abdominal pain, blood in stool, constipation, diarrhea, melena, nausea and vomiting.  Genitourinary: Negative for dysuria, frequency, hematuria and urgency.  Musculoskeletal: Positive for joint pain (right knee and ankle; arthritis in fingers). Negative for back pain, myalgias and neck pain.  Skin: Negative for itching and rash.  Neurological: Negative for dizziness, tingling, sensory change, weakness and headaches.  Endo/Heme/Allergies: Does not bruise/bleed easily.  Psychiatric/Behavioral: Negative for depression and memory loss. The patient is not nervous/anxious and does not have insomnia.   All other systems reviewed and are negative.  Performance status (ECOG): 0  Vitals Blood pressure (!) 149/73, pulse 70, temperature (!) 96.6 F (  35.9 C), temperature source Tympanic, resp. rate 16, weight 144 lb 1.1 oz (65.4 kg), SpO2 98 %.   Physical Exam Vitals and nursing note reviewed.  Constitutional:      General: She is not in acute distress.    Appearance: She is not diaphoretic.  Eyes:     General: No scleral icterus.    Conjunctiva/sclera: Conjunctivae normal.  Neurological:     Mental Status: She is alert and oriented to person, place, and time.  Psychiatric:        Behavior: Behavior normal.        Thought Content: Thought content normal.        Judgment: Judgment normal.    No visits with results within 3 Day(s) from this visit.  Latest known visit with results is:  Appointment on 10/25/2020  Component Date Value Ref Range Status  . LDH 10/25/2020 174  98 - 192 U/L Final   Performed at [facility], [address] [place], [place] [address]  . Specimen Type 10/25/2020 BLOOD   Final  . Cells Counted  10/25/2020 100   Final  . Cells Analyzed 10/25/2020 100   Final  . FISH Result 10/25/2020 Comment:   Final   70% OF NUCLEI POSITIVE FOR BCR/ABL1 FUSION  . Interpretation 10/25/2020 Comment:   Final   Comment: (NOTE) CML/ALL RELATED CLONE DETECTED nuc ish 9q34(ABL1x3,ASS1x2),22q11.2(BCRx3)(ABL1 con BCRx2)[70/100]        The fluorescence in situ hybridization (FISH) study on the sample received was positive for the BCR-ABL1 gene fusion. Unique sequence dual fusion DNA probes for the BCR (22q11.2) and ABL1 (9q34) loci and a control gene, ASS1 (Argininosuccinate Synthetase) located adjacent to ABL1 showed two BCR-ABL1 fusion signals, a single ABL1 signal and a single BCR signal in all abnormal interphase cells analyzed. The presence of BCR-ABL1 fusion signals is associated with the [place] translocation, the hallmark of CML, as well as ALL.      In those cases where blood is submitted for BCR/ABL1 FISH, the results should be interpreted in conjunction with PMN ratios. .      This analysis is limited to abnormalities detectable by the specific probes included in the study. FISH results should be interpreted within the context of a full cytogenetic analysis and hematologi                          c evaluation. The DNA probe vendor for this study was [employer], research (physical sciences)). A BCR-ABL1 gene fusion in greater than 3 interphase nuclei in a patient with a new clinical diagnosis is considered positive..      This test was developed and its performance characteristics determined by [facility]). It has not been cleared or approved by the U.S. Food and Drug Administration.   . Director Review: 10/25/2020 Comment:   Final   Comment: (NOTE) Laurie Panda, PHD Performed At: [facility] [place], [place] [id number] Katina Degree MDPhD [id number]   . PATH INTERP XXX-IMP 10/25/2020 Comment   Final   Comment: (NOTE) Left  shifted aberrant neutrophils, aberrant monocytes and absolute basophilia, see comment.   Hulen Skains COMMENT IMP 10/25/2020 Comment   Corrected   Comment: (NOTE) These results could be consistent with a myeloproliferative process. Correlation with available clinical, laboratory, and morphologic data is recommended.  Cytogenetic analysis, BCR-ABL testing, and/or JAK2 evaluation will be contributory. Bone marrow evaluation is recommended if clinically  indicated.   [name] CLINICAL INFO 10/25/2020 Comment   Corrected   Comment: (NOTE) A recent CBC was not available for review at the time this report was prepared.   [name] Specimen Type 10/25/2020 Comment   Final   Peripheral blood  . ASSESSMENT OF LEUKOCYTES 10/25/2020 Comment   Final   Comment: (NOTE) No monoclonal B cell population is detected. kappa:lambda ratio 1.6 There is no loss of, or aberrant expression of, the pan T cell antigens to suggest a neoplastic T cell process. CD4:CD8 ratio 1.4 No circulating blasts are detected. Granulocytes show left-shifted maturation. A subset of granulocytes show aberrant CD56 expression. Monocytes show aberrant expression of CD56, a finding that can be seen in association with both reactive/activated processes as well as neoplastic processes. Absolute basophilia is detected. Analysis of the leukocyte population shows: granulocytes 71%, monocytes 4%, lymphocytes 25%, blasts <0.1%, B cells 2%, T cells 18%,  NK cells 5%.   . % Viable Cells 10/25/2020 Comment   Corrected   96%  . ANALYSIS AND GATING STRATEGY 10/25/2020 Comment   Final   8 color analysis with CD45/SSC gating  . IMMUNOPHENOTYPING STUDY 10/25/2020 Comment   Final   Comment: (NOTE) CD2       Normal         CD3       Normal CD4       Normal         CD5       Normal CD7       Normal         CD8       Normal CD10      Normal         CD11b     Normal CD13      Normal         CD14      Normal CD16      Normal         CD19       Normal CD20      Normal         CD33      Normal CD34      Normal         CD38      Normal CD45      Normal         CD56      See Text CD57      Normal         CD117     Normal HLA-DR    Normal         KAPPA     Normal LAMBDA    Normal         CD64      Normal   . PATHOLOGIST NAME 10/25/2020 Comment   Final   Henrietta Hoover, M.D.  . COMMENT: 10/25/2020 Comment   Corrected   Comment: (NOTE) Each antibody in this assay was utilized to assess for potential abnormalities of studied cell populations or to characterize identified abnormalities. This test was developed and its performance characteristics determined by [facility].  It has not been cleared or approved by the U.S. Food and Drug Administration. The FDA has determined that such clearance or approval is not necessary. This test is used for clinical purposes.  It should not be regarded as investigational or for research. Performed At: [facility] [address] [place], [place] [id number] Katina Degree MDPhD [id number] Performed At: [facility] [address] [place], [place] [id number] Katina Degree MDPhD AS:[id number]   . Path Review 10/25/2020 Blood smear is reviewed.   Final   Comment: High normal WBC count, with mild left shift and slight basophilia. No circulating blasts identified. Normocytic erythrocytes with unremarkable morphology. Normal platelet count and morphology. The cause of the patient's left shift and mild basophilia is unclear from morphologic review alone. The overall picture does not appear typical for a myeloproliferative process, such as CML. Other potential causes of leukocytosis should be considered, including acute infection, inflammatory response, drug reaction, and/or stress. Clinical correlation is recommended. Reviewed by Kathi Simpers, M.D. Performed at [facility], [address] [place], [place] [address]   . WBC 10/25/2020 10.2  4.0 - 10.5 K/uL Final  . RBC 10/25/2020  4.63  3.87 - 5.11 MIL/uL Final  . Hemoglobin 10/25/2020 13.7  12.0 - 15.0 g/dL Final  . HCT 10/25/2020 40.8  36.0 - 46.0 % Final  . MCV 10/25/2020 88.1  80.0 - 100.0 fL Final  . MCH 10/25/2020 29.6  26.0 - 34.0 pg Final  . MCHC 10/25/2020 33.6  30.0 - 36.0 g/dL Final  . RDW 10/25/2020 13.0  11.5 - 15.5 % Final  . Platelets 10/25/2020 352  150 - 400 K/uL Final  . nRBC 10/25/2020 0.0  0.0 - 0.2 % Final  . Neutrophils Relative % 10/25/2020 51  % Final  . Neutro Abs 10/25/2020 5.2  1.7 - 7.7 K/uL Final  . Lymphocytes Relative 10/25/2020 29  % Final  . Lymphs Abs 10/25/2020 2.9  0.7 - 4.0 K/uL Final  . Monocytes Relative 10/25/2020 6  % Final  . Monocytes Absolute 10/25/2020 0.6  0.1 - 1.0 K/uL Final  . Eosinophils Relative 10/25/2020 4  % Final  . Eosinophils Absolute 10/25/2020 0.4  0.0 - 0.5 K/uL Final  . Basophils Relative 10/25/2020 7  % Final  . Basophils Absolute 10/25/2020 0.7* 0.0 - 0.1 K/uL Final  . RBC Morphology 10/25/2020 NO SCHISTOCYTES SEEN   Final  . Immature Granulocytes 10/25/2020 3  % Final  . Abs Immature Granulocytes 10/25/2020 0.32* 0.00 - 0.07 K/uL Final   Performed at [facility], [address], [place], [place] [address]    Assessment:  Jenna Stein is a 70 y.o. female with chronic myelogenous leukemia (CML).  She presented with an abnormal peripheral smear (3% myelocytes) on 10/09/2020.  Work-up on 10/25/2020 revealed a hematocrit of 40.8, hemoglobin 13.7, platelets 352,000, WBC 10,200. LDH was 174.  BCR-ABL showed 70% of nuclei positive for BCR/ABL1 fusion.  Flow cytometry revealed left shifted aberrant neutrophils, aberrant monocytes and absolute basophilia.   Peripheral smear revealed a high normal WBC count, with mild left shift and slight basophilia. No circulating blasts were identified. There were normocytic erythrocytes with unremarkable morphology. Platelet count and morphology were normal.   The patient received the [place] COVID-19 vaccine  on 09/27/2019 and 10/18/2019. She received the [employer] on 04/18/2020.  Symptomatically, she feels "fine.  She denies any fevers, sweats, weight loss or early satiety.  Plan: 1.   Labs today:  BCR-ABL quantitative by PCR, hepatitis B core antibody total, hepatitis B surface antigen, hepatitis C antibody. 2.   Chronic myelogenous leukemia  Work-up revealed BCR-ABL + c/w CML.  Discuss obtaining an abdominal ultrasound to assess spleen size to determine risk score.  Additional labs today.  Bone marrow aspirate and biopsy.  Discuss treatment with a tyrosine kinase inhibitor (TKI)- first generation or second  generation based on score.   Multiple questions asked and answered. 3.   Limited spleen ultrasound on 11/05/2020. 4.   Bone marrow aspirate and biopsy on 11/05/2020. 5.   Patient on multiple supplements- contact pharmacy regarding any potential interactions with TKIs. 6.   RTC 2 weeks after bone marrow for MD assessment and discussion regarding direction of therapy.     I discussed the assessment and treatment plan with the patient.  The patient was provided an opportunity to ask questions and all were answered.  The patient agreed with the plan and demonstrated an understanding of the instructions.  The patient was advised to call back if the symptoms worsen or if the condition fails to improve as anticipated.   Nuria Phebus C. Mike Gip, MD, PhD    11/01/2020, 4:28 PM  I, Mirian Mo Tufford, am acting as Education administrator for [employer]. Mike Gip, MD, PhD.  I, Monisha Siebel C. Mike Gip, MD, have reviewed the above documentation for accuracy and completeness, and I agree with the above.

## 2020-11-01 ENCOUNTER — Encounter: Payer: Self-pay | Admitting: Hematology and Oncology

## 2020-11-01 ENCOUNTER — Other Ambulatory Visit: Payer: Self-pay

## 2020-11-01 ENCOUNTER — Inpatient Hospital Stay (HOSPITAL_BASED_OUTPATIENT_CLINIC_OR_DEPARTMENT_OTHER): Payer: Medicare Other | Admitting: Hematology and Oncology

## 2020-11-01 ENCOUNTER — Inpatient Hospital Stay: Payer: Medicare Other

## 2020-11-01 VITALS — BP 149/73 | HR 70 | Temp 96.6°F | Resp 16 | Wt 144.1 lb

## 2020-11-01 DIAGNOSIS — R799 Abnormal finding of blood chemistry, unspecified: Secondary | ICD-10-CM | POA: Diagnosis not present

## 2020-11-01 DIAGNOSIS — Z803 Family history of malignant neoplasm of breast: Secondary | ICD-10-CM | POA: Diagnosis not present

## 2020-11-01 DIAGNOSIS — M8580 Other specified disorders of bone density and structure, unspecified site: Secondary | ICD-10-CM | POA: Diagnosis not present

## 2020-11-01 DIAGNOSIS — C9210 Chronic myeloid leukemia, BCR/ABL-positive, not having achieved remission: Secondary | ICD-10-CM | POA: Diagnosis not present

## 2020-11-01 NOTE — Patient Instructions (Signed)
Chronic Myelogenous Leukemia  Chronic myelogenous leukemia (CML) is a rare form of cancer of the blood cells. Chronic means that the leukemia develops more slowly than other kinds of leukemia. CML results from an abnormal chromosome called the [place] chromosome. This chromosome causes the body to make too many abnormal white blood cells, which keeps the body from making enough healthy blood cells such as red blood cells, white blood cells, and platelets. There are three phases of this condition:  Chronic phase. During this phase, leukemia cells grow slowly. There are minimal symptoms during this phase.  Accelerated phase. During this phase, there is an increase in the percentage of the leukemia cells in the bone marrow.  Blast phase. During this phase, there are many leukemia cells in the bone marrow. The leukemia cells are most aggressive and difficult to control during this phase. Symptoms are present and worsen during the accelerated and blast phases. What are the causes? The cause of this condition is not known. What increases the risk? You are more likely to develop this condition if:  You are 70 years old and older.  You are female.  You have had radiation treatment before.  You have the [place] chromosome. What are the signs or symptoms? Symptoms of this condition include:  Feeling more tired than usual, even after resting.  Pain in the joints and a feeling of general discomfort.  Unplanned weight loss.  Heavy sweating at night.  Fever.  A feeling of fullness in the upper left part of the abdomen.  Paleness.  Easy bruising or bleeding.  Frequent infections. It takes a while for symptoms to occur. There may be no symptoms in the early stages of this condition. How is this diagnosed? This condition may be diagnosed with:  A physical exam. Your health care provider will check for an enlarged spleen, liver, or lymph nodes.  Complete blood count with  differential. This test measures the characteristics of your white blood cells, red blood cells, and platelets.  Bone marrow tests.  An imaging test. A CT scan is used to check for swelling or anything abnormal in your spleen, liver, and lymph nodes.  Tests to screen for the [place] chromosome and other genetic abnormalities. These may include: ? Cytogenetic analysis. This test checks the number and shape of chromosomes in the cell. Any damage to the chromosome can be discovered by this test. ? Fluorescence in situ hybridization (FISH). This test checks for defects in chromosomes and how those defects affect the functioning of the cell. Results from a [place] test will be used to determine treatment and assess the outcome of that treatment. ? Reverse transcription polymerase chain reaction test. This checks for problems in the chromosome, including the presence of the [place] chromosome in the cell.   How is this treated? Treatment for this condition depends on the phase of the disease. Treatment may include:  Targeted medicines to treat specific gene mutations. These are medicines that work to stop the leukemia cells from growing and multiplying.  Chemotherapy. This treatment uses medicines to kill leukemia cells.  Biological therapy (immunotherapy). This treatment boosts the ability of your body's defense system (immune system) to fight the leukemia cells.  Bone marrow or peripheral blood stem cell transplant. This treatment replaces your own bone marrow or stem cells with bone marrow or stem cells from a donor.  Surgery to remove the spleen.  New treatments through clinical trials. Additional medicines may be needed to help manage symptoms. Follow these  instructions at home: Medicines  Take over-the-counter and prescription medicines only as told by your health care provider.  If you were prescribed an antibiotic medicine, take it as told by your health care provider. Do not  stop taking the antibiotic even if you start to feel better. If you are on chemotherapy:  Wash your hands often, especially before meals, after being outside, and after using the toilet. Have visitors do the same.  Keep your teeth and gums clean and well cared for. Use soft toothbrushes.  Protect your skin from the sun by using sunscreen and wearing protective clothing.  Make sure that your family members get a flu shot (influenza vaccine) every year. General instructions  Avoid contact sports or other rough activities. Ask your health care provider what activities are safe for you.  Avoid crowded places and people who are sick.  Tell your cancer care team if you develop side effects. They may be able to recommend ways to relieve them.  Try to eat regular, healthy meals. Some of your treatments might affect your appetite.  Find healthy ways of coping with stress, such as by doing yoga or meditation or by joining a support group.  Keep all follow-up visits as told by your health care provider. This is important. Where to find more information  American Cancer Society: www.cancer.org  Leukemia and Lymphoma Society: PreviewPal.pl  National Cancer Institute ([place]): www.cancer.gov Contact a health care provider if you:  Feel light-headed.  Develop bleeding from your gums or nose.  Cannot eat or drink without vomiting. Get help right away if you:  Have a fever or chills.  Develop chest pain.  Have trouble breathing, or you feel short of breath.  Faint.  See blood in your urine or stool (feces).  Have excessive bleeding.  Have any severe or uncontrolled symptoms. These symptoms may represent a serious problem that is an emergency. Do not wait to see if the symptoms will go away. Get medical help right away. Call your local emergency services (911 in the U.S.). Do not drive yourself to the hospital. Summary  CML is a chronic leukemia that produces abnormal white blood  cells in the body.  Treatment may include targeted medicines to treat specific gene mutations, chemotherapy, biological therapy, bone marrow or peripheral blood stem cell transplant, or surgery.  Take over-the-counter and prescription medicines only as told by your health care provider.  You may be asked to stop some activities. Ask your health care provider what activities are safe for you.  Get help right away if you have any severe or uncontrolled symptoms. This information is not intended to replace advice given to you by your health care provider. Make sure you discuss any questions you have with your health care provider. Document Revised: 03/03/2019 Document Reviewed: 03/03/2019 Elsevier Patient Education  [place].

## 2020-11-01 NOTE — Progress Notes (Signed)
Pt in for follow up for results and work up review. Husband accompanying pt today.

## 2020-11-02 ENCOUNTER — Other Ambulatory Visit: Payer: Self-pay | Admitting: Student

## 2020-11-02 ENCOUNTER — Encounter: Payer: Self-pay | Admitting: *Deleted

## 2020-11-02 ENCOUNTER — Telehealth: Payer: Self-pay | Admitting: *Deleted

## 2020-11-02 NOTE — Telephone Encounter (Signed)
Left vm for patient to call back to review details of upcoming appt for CT guided bone marrow biopsy.

## 2020-11-02 NOTE — Telephone Encounter (Signed)
Patient scheduled for CT guided bone marrow biopsy on 3/21 at 9:00, arrive at 8:00. Patient also scheduled for ABD [place] at 10:00 on 3/21-scheduling message sent to r/s due to bone marrow biopsy. Patient aware of appts and verbalized understanding of appt details.

## 2020-11-02 NOTE — Patient Instructions (Signed)
Patient seen in clinic on 3/17, diagnosed with CML. Patient will be starting oral chemotherapy, drug TBD. Patient takes several supplements that Dr. Mike Gip would like to verify are compatible with oral chemotherapy drugs. Copy of all supplements along with patient info have been placed in folder on oral chemo pharmacy door.

## 2020-11-02 NOTE — Progress Notes (Signed)
Reviewed for drug-drug interaction (DDI) with imatinib, dasatinib, nilotinib, and bosutinib: Of note, the information on DDIs with herbal medications is limited. [facility] Natural Medicines interaction checker used for this review.  Patient reported herbals:  1) Nattokinase plus Fucoidan: no documented DDI with imatinib, dasatinib, nilotinib, and bosutinib 2) N-acetylcysteine: no documented DDI with imatinib, dasatinib, nilotinib, and bosutinib 3) Lipopo( reviewed by individual ingredients, Baker's Yeast extract, Lactobacillus, and fermented wheat extract):   Nilotinib: moderate interaction, fermented wheat germ extract appears to stimulate immune function  This interaction appears to be only documented with nilotinib, but based on the interaction description it was likely cause a problem for all CML medications  Recommendation: discontinue the Lipopo to avoid this potential risk   Darl Pikes, PharmD, BCPS, BCOP, CPP Hematology/Oncology Clinical Pharmacist Practitioner [facility]/HP/AP [facility] [phone]  11/02/2020 11:19 AM

## 2020-11-02 NOTE — Progress Notes (Signed)
Jenna Stein,  Thanks for the information!  Tillie Rung,  Can you let the patient know?  M

## 2020-11-05 ENCOUNTER — Other Ambulatory Visit: Payer: Self-pay

## 2020-11-05 ENCOUNTER — Ambulatory Visit
Admission: RE | Admit: 2020-11-05 | Discharge: 2020-11-05 | Disposition: A | Discharge status: Home / Self Care | Payer: Medicare Other | Source: Ambulatory Visit | Attending: Hematology and Oncology | Admitting: Hematology and Oncology

## 2020-11-05 ENCOUNTER — Ambulatory Visit (HOSPITAL_BASED_OUTPATIENT_CLINIC_OR_DEPARTMENT_OTHER): Payer: Medicare Other

## 2020-11-05 ENCOUNTER — Encounter: Payer: Self-pay | Admitting: Hematology and Oncology

## 2020-11-05 DIAGNOSIS — C9210 Chronic myeloid leukemia, BCR/ABL-positive, not having achieved remission: Secondary | ICD-10-CM | POA: Insufficient documentation

## 2020-11-05 DIAGNOSIS — D72822 Plasmacytosis: Secondary | ICD-10-CM | POA: Diagnosis not present

## 2020-11-05 DIAGNOSIS — D72824 Basophilia: Secondary | ICD-10-CM | POA: Diagnosis not present

## 2020-11-05 DIAGNOSIS — R897 Abnormal histological findings in specimens from other organs, systems and tissues: Secondary | ICD-10-CM | POA: Diagnosis not present

## 2020-11-05 LAB — CBC WITH DIFFERENTIAL/PLATELET
Abs Immature Granulocytes: 0.59 10*3/uL — ABNORMAL HIGH (ref 0.00–0.07)
Basophils Absolute: 0.9 10*3/uL — ABNORMAL HIGH (ref 0.0–0.1)
Basophils Relative: 6 %
Eosinophils Absolute: 0.5 10*3/uL (ref 0.0–0.5)
Eosinophils Relative: 3 %
HCT: 38.4 % (ref 36.0–46.0)
Hemoglobin: 12.7 g/dL (ref 12.0–15.0)
Immature Granulocytes: 4 %
Lymphocytes Relative: 23 %
Lymphs Abs: 3.2 10*3/uL (ref 0.7–4.0)
MCH: 29.3 pg (ref 26.0–34.0)
MCHC: 33.1 g/dL (ref 30.0–36.0)
MCV: 88.7 fL (ref 80.0–100.0)
Monocytes Absolute: 0.7 10*3/uL (ref 0.1–1.0)
Monocytes Relative: 5 %
Neutro Abs: 8.2 10*3/uL — ABNORMAL HIGH (ref 1.7–7.7)
Neutrophils Relative %: 59 %
Platelets: 309 10*3/uL (ref 150–400)
RBC: 4.33 MIL/uL (ref 3.87–5.11)
RDW: 12.8 % (ref 11.5–15.5)
Smear Review: NORMAL
WBC: 14.1 10*3/uL — ABNORMAL HIGH (ref 4.0–10.5)
nRBC: 0.1 % (ref 0.0–0.2)

## 2020-11-05 LAB — GLUCOSE, CAPILLARY: Glucose-Capillary: 113 mg/dL — ABNORMAL HIGH (ref 70–99)

## 2020-11-05 MED ORDER — MIDAZOLAM HCL 2 MG/2ML IJ SOLN
INTRAMUSCULAR | Status: AC
  Filled 2020-11-05: qty 2

## 2020-11-05 MED ORDER — SODIUM CHLORIDE 0.9 % IV SOLN: INTRAVENOUS | Status: DC

## 2020-11-05 MED ORDER — FENTANYL CITRATE (PF) 100 MCG/2ML IJ SOLN
INTRAMUSCULAR | Status: AC | PRN
  Administered 2020-11-05: 50 ug via INTRAVENOUS

## 2020-11-05 MED ORDER — HEPARIN SOD (PORK) LOCK FLUSH 100 UNIT/ML IV SOLN
INTRAVENOUS | Status: AC
  Filled 2020-11-05: qty 5

## 2020-11-05 MED ORDER — FENTANYL CITRATE (PF) 100 MCG/2ML IJ SOLN
INTRAMUSCULAR | Status: AC
  Filled 2020-11-05: qty 2

## 2020-11-05 MED ORDER — MIDAZOLAM HCL 2 MG/2ML IJ SOLN
INTRAMUSCULAR | Status: AC | PRN
Start: 2020-11-05 — End: 2020-11-05
  Administered 2020-11-05: 1 mg via INTRAVENOUS

## 2020-11-05 NOTE — Procedures (Signed)
Interventional Radiology Procedure Note  Procedure: CT guided bone marrow aspiration and biopsy  Complications: None  EBL: < 10 mL  Findings: Aspirate and core biopsy performed of bone marrow in right iliac bone.  Plan: Bedrest supine x 1 hrs  Maritsa Hunsucker T. Danay Mckellar, M.D Pager:  [phone]   

## 2020-11-05 NOTE — Progress Notes (Signed)
Patient clinically stable post BMB per Dr Kathlene Cote, tolerated well. Denies complaints at this time.awake/alert and oriented post procedure. Vitals stable pre and post procedure. Received Versed 1 mg along with Fentanyl 50 mcg IV for procedure. Report given  To Carlynn Spry RN post procedure/recovery.

## 2020-11-05 NOTE — H&P (Signed)
Chief Complaint: Evaluation of CML. Request is for bone marrow biopsy  Referring Physician(s): Leona C  Supervising Physician: Aletta Edouard  Patient Status: [facility] - Out-pt  History of Present Illness: Jenna Stein is a 70 y.o. female History of DM, HLD, HTN, GERD, newly diagnosed with Chronic myeloid leukemia. Team is requesting a bone marrow biopsy for further evaluation. Currently without any significant complaints. Patient alert and laying in bed, calm and comfortable. Denies any fevers, headache, chest pain, SOB, cough, abdominal pain, nausea, vomiting or bleeding. Return precautions and treatment recommendations and follow-up discussed with the patient who is agreeable with the plan.   Past Medical History:  Diagnosis Date  . Allergy   . Anxiety   . Arthritis 2010  . Depression   . Diabetes mellitus without complication ([place])   . GERD (gastroesophageal reflux disease)   . Hyperlipidemia   . Hypertension     Past Surgical History:  Procedure Laterality Date  . CYST EXCISION Left 2017   foot  . HAND SURGERY  07/2012    Allergies: Clonazepam and Trazodone  Medications: Prior to Admission medications   Medication Sig Start Date End Date Taking? Authorizing Provider  acyclovir (ZOVIRAX) 200 MG capsule Take 1 capsule (200 mg total) by mouth 5 (five) times daily. 10/09/20   Glean Hess, MD  atenolol (TENORMIN) 50 MG tablet Take 1 tablet (50 mg total) by mouth daily. 10/09/20   Glean Hess, MD  atorvastatin (LIPITOR) 10 MG tablet Take 1 tablet (10 mg total) by mouth at bedtime. 07/01/20   Glean Hess, MD  Bilberry 100 MG CAPS Take by mouth.    [provider]  celecoxib (CELEBREX) 200 MG capsule TAKE 1 CAPSULE BY MOUTH EVERY DAY 06/04/20   Glean Hess, MD  Coenzyme Q10 (CO Q 10) 100 MG CAPS Take 2 capsules by mouth daily.     [provider]  estazolam (PROSOM) 2 MG tablet TAKE 1 TABLET (2 MG TOTAL) BY MOUTH AT  BEDTIME. 10/23/20   Glean Hess, MD  glucose blood (FREESTYLE LITE) test strip Use to test BS twice daily 02/01/20   Glean Hess, MD  hydrochlorothiazide (HYDRODIURIL) 12.5 MG tablet Take 1 tablet (12.5 mg total) by mouth daily. 06/04/20   Glean Hess, MD  Lancets (FREESTYLE) lancets Use to test BS once daily 10/16/16   Glean Hess, MD  Lutein 20 MG TABS Take 1 tablet by mouth daily.     [provider]  metFORMIN (GLUCOPHAGE) 1000 MG tablet Take 1 tablet (1,000 mg total) by mouth 2 (two) times daily with a meal. 10/09/20   Glean Hess, MD  metroNIDAZOLE (METROGEL VAGINAL) 0.75 % vaginal gel Place 1 Applicatorful vaginally 2 (two) times daily. For 5 days for recurrence of vaginitis 10/09/20   Glean Hess, MD  MULTIPLE VITAMIN PO 1 tablet daily.  04/04/09   [provider]  olopatadine (PATANOL) 0.1 % ophthalmic solution Apply to eye.    [provider]  Omega-3 Fatty Acids (FISH OIL PEARLS PO) Take by mouth.    [provider]  omeprazole (PRILOSEC) 20 MG capsule Take 1 capsule (20 mg total) by mouth daily. 10/09/20   Glean Hess, MD  PARoxetine (PAXIL) 20 MG tablet Take 1 tablet (20 mg total) by mouth daily. 10/09/20   Glean Hess, MD  sitaGLIPtin (JANUVIA) 100 MG tablet Take 1 tablet (100 mg total) by mouth daily. 10/09/20   Halina Maidens  H, MD  triamcinolone cream (KENALOG) 0.5 % APPLY 1 APPLICATION TOPICALLY 3 (THREE) TIMES DAILY. TO RASH ON ABDOMEN 08/27/20   Glean Hess, MD  TURMERIC PO Take 800 mg by mouth.    [provider]  valsartan (DIOVAN) 160 MG tablet Take 1 tablet (160 mg total) by mouth daily. 10/28/20   Glean Hess, MD  vitamin E (VITAMIN E) 400 UNIT capsule Take 1 capsule (400 Units total) by mouth daily. 09/05/15   Margarita Rana, MD     Family History  Problem Relation Age of Onset  . Hypertension Mother   . Hyperlipidemia Mother   . Arthritis Mother   . Parkinsonism Father   .  Arthritis Other   . Hypertension Other   . Hyperlipidemia Other   . Hypothyroidism Other   . Breast cancer Sister 55  . Cancer Sister     Social History   Socioeconomic History  . Marital status: Married    Spouse name: Not on file  . Number of children: 1  . Years of education: Not on file  . Highest education level: Bachelor's degree (e.g., BA, AB, BS)  Occupational History  . Occupation: Retired  Tobacco Use  . Smoking status: Never Smoker  . Smokeless tobacco: Never Used  . Tobacco comment: smoking cessation materials not required  Vaping Use  . Vaping Use: Never used  Substance and Sexual Activity  . Alcohol use: No  . Drug use: No  . Sexual activity: Not Currently    Birth control/protection: None  Other Topics Concern  . Not on file  Social History Narrative  . Not on file   Social Determinants of Health   Financial Resource Strain: Low Risk   . Difficulty of Paying Living Expenses: Not hard at all  Food Insecurity: No Food Insecurity  . Worried About Charity fundraiser in the Last Year: Never true  . Ran Out of Food in the Last Year: Never true  Transportation Needs: No Transportation Needs  . Lack of Transportation (Medical): No  . Lack of Transportation (Non-Medical): No  Physical Activity: Sufficiently Active  . Days of Exercise per Week: 5 days  . Minutes of Exercise per Session: 30 min  Stress: No Stress Concern Present  . Feeling of Stress : Not at all  Social Connections: Moderately Isolated  . Frequency of Communication with Friends and Family: More than three times a week  . Frequency of Social Gatherings with Friends and Family: Once a week  . Attends Religious Services: Never  . Active Member of Clubs or Organizations: No  . Attends Archivist Meetings: Never  . Marital Status: Married     Review of Systems: A 12 point ROS discussed and pertinent positives are indicated in the HPI above.  All other systems are  negative.  Review of Systems  Constitutional: Negative for fatigue and fever.  HENT: Negative for congestion.   Respiratory: Negative for cough and shortness of breath.   Gastrointestinal: Negative for abdominal pain, diarrhea, nausea and vomiting.    Vital Signs: BP (!) 151/81   Pulse 68   Temp 97.9 F (36.6 C) (Oral)   Ht 5' 4" (1.626 m)   Wt 141 lb (64 kg)   SpO2 98%   BMI 24.20 kg/m   Physical Exam Vitals and nursing note reviewed.  Constitutional:      Appearance: She is well-developed.  HENT:     Head: Normocephalic and atraumatic.  Eyes:  Conjunctiva/sclera: Conjunctivae normal.  Cardiovascular:     Rate and Rhythm: Normal rate and regular rhythm.     Heart sounds: Normal heart sounds.  Pulmonary:     Effort: Pulmonary effort is normal.  Musculoskeletal:        General: Normal range of motion.     Cervical back: Normal range of motion.  Skin:    General: Skin is warm.  Neurological:     Mental Status: She is alert and oriented to person, place, and time.     Imaging: No results found.  Labs:  CBC: Recent Labs    10/09/20 1114 10/25/20 1540  WBC 9.5 10.2  HGB 13.7 13.7  HCT 41.4 40.8  PLT 337 352    COAGS: No results for input(s): INR, APTT in the last 8760 hours.  BMP: Recent Labs    06/04/20 1401 10/09/20 1114  NA 136 140  K 4.2 4.4  CL 97 103  CO2 25 22  GLUCOSE 113* 122*  BUN 9 15  CALCIUM 9.7 9.5  CREATININE 0.84 0.95  GFRNONAA 72 61  GFRAA 83 71    LIVER FUNCTION TESTS: Recent Labs    06/04/20 1401 10/09/20 1114  BILITOT 0.6 0.5  AST 25 20  ALT 25 15  ALKPHOS 59 66  PROT 7.3 7.5  ALBUMIN 4.8 4.7    Assessment and Plan:  69 y.o. female. History of DM, HLD, HTN, GERD, newly diagnosed with Chronic myeloid leukemia. Team is requesting a bone marrow biopsy for further evaluation.  WBC is 14.1.  All other labs and medications are within acceptable parameters. Allergies include clozanapam and trazodone. Ms Taber  has been NPO since midnight.  Risks and benefits of bone marrow biopsy was discussed with the patient and/or patient's family including, but not limited to bleeding, infection, damage to adjacent structures or low yield requiring additional tests.  All of the questions were answered and there is agreement to proceed.  Consent signed and in chart.   Thank you for this interesting consult.  I greatly enjoyed meeting Cyndal Eckroth and look forward to participating in their care.  A copy of this report was sent to the requesting provider on this date.  Electronically Signed: Jennifer C Omohundro, NP 11/05/2020, 8:03 AM   I spent a total of  30 Minutes   in face to face in clinical consultation, greater than 50% of which was counseling/coordinating care for bone marrow biopsy  

## 2020-11-05 NOTE — Discharge Instructions (Signed)
Bone Marrow Aspiration and Bone Marrow Biopsy, Adult, Care After This sheet gives you information about how to care for yourself after your procedure. Your health care provider may also give you more specific instructions. If you have problems or questions, contact your health care provider. What can I expect after the procedure? After the procedure, it is common to have:  Mild pain and tenderness.  Swelling.  Bruising. Follow these instructions at home: Puncture site care  Follow instructions from your health care provider about how to take care of the puncture site. Make sure you: ? Wash your hands with soap and water before and after you change your bandage (dressing). If soap and water are not available, use hand sanitizer. ? Change your dressing as told by your health care provider.  Check your puncture site every day for signs of infection. Check for: ? More redness, swelling, or pain. ? Fluid or blood. ? Warmth. ? Pus or a bad smell.   Activity  Return to your normal activities as told by your health care provider. Ask your health care provider what activities are safe for you.  Do not lift anything that is heavier than 10 lb (4.5 kg), or the limit that you are told, until your health care provider says that it is safe.  Do not drive for 24 hours if you were given a sedative during your procedure. General instructions  Take over-the-counter and prescription medicines only as told by your health care provider.  Do not take baths, swim, or use a hot tub until your health care provider approves. Ask your health care provider if you may take showers. You may only be allowed to take sponge baths.  If directed, put ice on the affected area. To do this: ? Put ice in a plastic bag. ? Place a towel between your skin and the bag. ? Leave the ice on for 20 minutes, 2-3 times a day.  Keep all follow-up visits as told by your health care provider. This is important.   Contact a  health care provider if:  Your pain is not controlled with medicine.  You have a fever.  You have more redness, swelling, or pain around the puncture site.  You have fluid or blood coming from the puncture site.  Your puncture site feels warm to the touch.  You have pus or a bad smell coming from the puncture site. Summary  After the procedure, it is common to have mild pain, tenderness, swelling, and bruising.  Follow instructions from your health care provider about how to take care of the puncture site and what activities are safe for you.  Take over-the-counter and prescription medicines only as told by your health care provider.  Contact a health care provider if you have any signs of infection, such as fluid or blood coming from the puncture site. This information is not intended to replace advice given to you by your health care provider. Make sure you discuss any questions you have with your health care provider. Document Revised: 12/21/2018 Document Reviewed: 12/21/2018 Elsevier Patient Education  2021 [employer]  

## 2020-11-06 LAB — SURGICAL PATHOLOGY

## 2020-11-08 LAB — BCR-ABL1, CML/ALL, PCR, QUANT

## 2020-11-12 ENCOUNTER — Encounter (HOSPITAL_COMMUNITY): Payer: Self-pay | Admitting: Hematology and Oncology

## 2020-11-15 ENCOUNTER — Other Ambulatory Visit: Payer: Self-pay | Admitting: *Deleted

## 2020-11-15 ENCOUNTER — Encounter (HOSPITAL_COMMUNITY): Payer: Self-pay

## 2020-11-15 DIAGNOSIS — C9210 Chronic myeloid leukemia, BCR/ABL-positive, not having achieved remission: Secondary | ICD-10-CM

## 2020-11-15 NOTE — Progress Notes (Signed)
[facility]  [address] [place], [place] [address] Phone: [phone]  Fax: [phone]   Clinic Day:  11/19/2020  Referring physician: Glean Hess, MD  Chief Complaint: Jenna Stein is a 70 y.o. female with chronic phase CML who is seen for review of bone marrow and discussion regarding direction of therapy.  HPI: The patient was last seen in the hematology clinic on 11/01/2020. At that time, she felt "fine".  BCR-ABL1 revealed 123.6615% b2a2, < 0.0032% b3a2, and < 0.0032% E1A2 transcript. The ABL1 e13a2 (b2a2, p210) fusion transcript was positive.  We discussed the plan for a baseline bone marrow prior to initiation of a TKI (first or second generation).  Bone marrow on 11/05/2020 showed a slightly hypercellular marrow with mild granulocytic proliferation and atypical megakaryocytes. The combined bone marrow and peripheral blood findings were very limited but were generally c/w early involvement by chronic myeloid leukemia, chronic phase especially in the presence of previously documented positivity for BCR/ABL1 fusion in peripheral blood analysis.  Cytogenetics revealed 81, XX, t(9;22)(q34.1; q11.2)[19] / 70, XX [1].  Molecular genetics showed a BCR-ABL1 translocation t(9;22) with 65.5684 % BCR-ABL1/ABL1 (IS), major breakpoint p210, log reduction 0.001.  Limited abdominal ultrasound on 11/16/2020 revealed no splenomegaly (7.2 x 9.5 x 4.4 cm).  Relative risk (RR) Sokal 0.81 (intermediate) and Hasford 684.83 (low) based on age (25), spleen (0), platelets (309), basophils (6%), eosinophils (3%), and myeloblasts (occ-1%). ELTS risk score 1.5596 (low risk) based on 0% blasts and 1.6648 (intermediate-risk) based on 1% blasts.  During the interim, she has felt good. She is a little bit more tired. Her joint pain is stable. She takes omeprazole for heartburn. She denies nausea, vomiting and diarrhea.  She takes vitamin D 5,000 IU daily. She takes Zinc 50  mg. She drinks 2 cups of coffee per day, which keeps her from being constipated.  She is planning to go to [place] in 05/2021.   Past Medical History:  Diagnosis Date  . Allergy   . Anxiety   . Arthritis 2010  . Chronic myeloid leukemia, BCR/ABL-positive, not having achieved remission ([place]) 11/17/2020  . Depression   . Diabetes mellitus without complication ([place])   . GERD (gastroesophageal reflux disease)   . Hyperlipidemia   . Hypertension     Past Surgical History:  Procedure Laterality Date  . CYST EXCISION Left 2017   foot  . HAND SURGERY  07/2012    Family History  Problem Relation Age of Onset  . Hypertension Mother   . Hyperlipidemia Mother   . Arthritis Mother   . Parkinsonism Father   . Arthritis Other   . Hypertension Other   . Hyperlipidemia Other   . Hypothyroidism Other   . Breast cancer Sister 44  . Cancer Sister     Social History:  reports that she has never smoked. She has never used smokeless tobacco. She reports that she does not drink alcohol and does not use drugs. She denies tobacco and alcohol use. She denies any exposure to radiation or toxins. She retired 20 years ago. 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 Her daughter and granddaughter live in [place]. She is originally from [place] and her sister and brother still live there. Her parents are from [place]. Her husband is [employer] fast [employer]. The patient is accompanied by [facility] today.  Allergies:  Allergies  Allergen Reactions  . Clonazepam Other (See Comments)    Nocturnal enuresis; Lethargy   . Trazodone Anxiety and  Other (See Comments)    Dizziness     Current Medications: Current Outpatient Medications  Medication Sig Dispense Refill  . acyclovir (ZOVIRAX) 200 MG capsule Take 1 capsule (200 mg total) by mouth 5 (five) times daily. 90 capsule 1  . atenolol (TENORMIN) 50 MG tablet Take 1 tablet (50 mg total) by mouth daily. 90 tablet 1  . atorvastatin (LIPITOR) 10 MG tablet Take 1 tablet (10 mg  total) by mouth at bedtime. 90 tablet 1  . Bilberry 100 MG CAPS Take by mouth.    . celecoxib (CELEBREX) 200 MG capsule TAKE 1 CAPSULE BY MOUTH EVERY DAY 90 capsule 3  . Coenzyme Q10 (CO Q 10) 100 MG CAPS Take 2 capsules by mouth daily.     [name] estazolam (PROSOM) 2 MG tablet TAKE 1 TABLET (2 MG TOTAL) BY MOUTH AT BEDTIME. 30 tablet 5  . glucose blood (FREESTYLE LITE) test strip Use to test BS twice daily 100 each 12  . hydrochlorothiazide (HYDRODIURIL) 12.5 MG tablet Take 1 tablet (12.5 mg total) by mouth daily. 90 tablet 3  . imatinib (GLEEVEC) 400 MG tablet Take 1 tablet (400 mg total) by mouth daily. Take with meals and large glass of water.Caution:Chemotherapy. 30 tablet 1  . Lancets (FREESTYLE) lancets Use to test BS once daily 100 each 12  . Lutein 20 MG TABS Take 1 tablet by mouth daily.     . metFORMIN (GLUCOPHAGE) 1000 MG tablet Take 1 tablet (1,000 mg total) by mouth 2 (two) times daily with a meal. 180 tablet 1  . metroNIDAZOLE (METROGEL VAGINAL) 0.75 % vaginal gel Place 1 Applicatorful vaginally 2 (two) times daily. For 5 days for recurrence of vaginitis 70 g 1  . MULTIPLE VITAMIN PO 1 tablet daily.     . Omega-3 Fatty Acids (FISH OIL PEARLS PO) Take by mouth.    [name] omeprazole (PRILOSEC) 20 MG capsule Take 1 capsule (20 mg total) by mouth daily. 90 capsule 1  . PARoxetine (PAXIL) 20 MG tablet Take 1 tablet (20 mg total) by mouth daily. 90 tablet 1  . sitaGLIPtin (JANUVIA) 100 MG tablet Take 1 tablet (100 mg total) by mouth daily. 90 tablet 1  . TURMERIC PO Take 800 mg by mouth.    . valsartan (DIOVAN) 160 MG tablet Take 1 tablet (160 mg total) by mouth daily. 90 tablet 1  . Vitamin D, Ergocalciferol, (DRISDOL) 1.25 MG (50000 UNIT) CAPS capsule Take 50,000 Units by mouth every 7 (seven) days.    . vitamin E (VITAMIN E) 400 UNIT capsule Take 1 capsule (400 Units total) by mouth daily. 1 capsule 0  . zinc gluconate 50 MG tablet Take 50 mg by mouth daily.    [name] olopatadine (PATANOL) 0.1 %  ophthalmic solution Apply to eye. (Patient not taking: No sig reported)    . triamcinolone cream (KENALOG) 0.5 % APPLY 1 APPLICATION TOPICALLY 3 (THREE) TIMES DAILY. TO RASH ON ABDOMEN (Patient not taking: No sig reported) 30 g 0   No current facility-administered medications for this visit.    Review of Systems  Constitutional: Positive for malaise/fatigue (slight) and weight loss (3 lbs). Negative for chills, diaphoresis and fever.  HENT: Negative for congestion, ear discharge, ear pain, hearing loss, nosebleeds, sinus pain, sore throat and tinnitus.   Eyes: Negative for blurred vision.  Respiratory: Negative for cough, hemoptysis, sputum production and shortness of breath.   Cardiovascular: Negative for chest pain, palpitations and leg swelling.  Gastrointestinal: Negative for abdominal pain, blood  in stool, constipation, diarrhea, heartburn (on omeprazole), melena, nausea and vomiting.  Genitourinary: Negative for dysuria, frequency, hematuria and urgency.  Musculoskeletal: Positive for joint pain (right knee and ankle. arthritis in fingers). Negative for back pain, myalgias and neck pain.  Skin: Negative for itching and rash.  Neurological: Negative for dizziness, tingling, sensory change, weakness and headaches.  Endo/Heme/Allergies: Does not bruise/bleed easily.  Psychiatric/Behavioral: Negative for depression and memory loss. The patient is not nervous/anxious and does not have insomnia.   All other systems reviewed and are negative.  Performance status (ECOG): 0  Vitals Blood pressure 135/74, pulse 75, temperature 98.7 F (37.1 C), resp. rate 18, weight 141 lb 6.8 oz (64.1 kg), SpO2 99 %.   Physical Exam Vitals and nursing note reviewed.  Constitutional:      General: She is not in acute distress.    Appearance: She is not diaphoretic.  HENT:     Head: Normocephalic and atraumatic.     Mouth/Throat:     Mouth: Mucous membranes are moist.     Pharynx: Oropharynx is clear.   Eyes:     General: No scleral icterus.    Extraocular Movements: Extraocular movements intact.     Conjunctiva/sclera: Conjunctivae normal.     Pupils: Pupils are equal, round, and reactive to light.  Cardiovascular:     Rate and Rhythm: Normal rate and regular rhythm.     Heart sounds: Normal heart sounds. No murmur heard.   Pulmonary:     Effort: Pulmonary effort is normal. No respiratory distress.     Breath sounds: Normal breath sounds. No wheezing or rales.  Chest:     Chest wall: No tenderness.  Breasts:     Right: No axillary adenopathy or supraclavicular adenopathy.     Left: No axillary adenopathy or supraclavicular adenopathy.    Abdominal:     General: Bowel sounds are normal. There is no distension.     Palpations: Abdomen is soft. There is no hepatomegaly, splenomegaly (spleen tucked under rib) or mass.     Tenderness: There is no abdominal tenderness. There is no guarding or rebound.  Musculoskeletal:        General: No swelling or tenderness. Normal range of motion.     Cervical back: Normal range of motion and neck supple.  Lymphadenopathy:     Head:     Right side of head: No preauricular, posterior auricular or occipital adenopathy.     Left side of head: No preauricular, posterior auricular or occipital adenopathy.     Cervical: No cervical adenopathy.     Upper Body:     Right upper body: No supraclavicular or axillary adenopathy.     Left upper body: No supraclavicular or axillary adenopathy.     Lower Body: No right inguinal adenopathy. No left inguinal adenopathy.  Skin:    General: Skin is warm and dry.     Comments: Well healed bone marrow site.  Neurological:     Mental Status: She is alert and oriented to person, place, and time.  Psychiatric:        Behavior: Behavior normal.        Thought Content: Thought content normal.        Judgment: Judgment normal.    No visits with results within 3 Day(s) from this visit.  Latest known visit with  results is:  Hospital Outpatient Visit on 11/05/2020  Component Date Value Ref Range Status  . WBC 11/05/2020 14.1* 4.0 - 10.5 K/uL  Final  . RBC 11/05/2020 4.33  3.87 - 5.11 MIL/uL Final  . Hemoglobin 11/05/2020 12.7  12.0 - 15.0 g/dL Final  . HCT 11/05/2020 38.4  36.0 - 46.0 % Final  . MCV 11/05/2020 88.7  80.0 - 100.0 fL Final  . MCH 11/05/2020 29.3  26.0 - 34.0 pg Final  . MCHC 11/05/2020 33.1  30.0 - 36.0 g/dL Final  . RDW 11/05/2020 12.8  11.5 - 15.5 % Final  . Platelets 11/05/2020 309  150 - 400 K/uL Final  . nRBC 11/05/2020 0.1  0.0 - 0.2 % Final  . Neutrophils Relative % 11/05/2020 59  % Final  . Neutro Abs 11/05/2020 8.2* 1.7 - 7.7 K/uL Final  . Lymphocytes Relative 11/05/2020 23  % Final  . Lymphs Abs 11/05/2020 3.2  0.7 - 4.0 K/uL Final  . Monocytes Relative 11/05/2020 5  % Final  . Monocytes Absolute 11/05/2020 0.7  0.1 - 1.0 K/uL Final  . Eosinophils Relative 11/05/2020 3  % Final  . Eosinophils Absolute 11/05/2020 0.5  0.0 - 0.5 K/uL Final  . Basophils Relative 11/05/2020 6  % Final  . Basophils Absolute 11/05/2020 0.9* 0.0 - 0.1 K/uL Final  . WBC Morphology 11/05/2020 MILD LEFT SHIFT (1-5% METAS, OCC MYELO, OCC BANDS)   Final  . RBC Morphology 11/05/2020 MORPHOLOGY UNREMARKABLE   Final  . Smear Review 11/05/2020 Normal platelet morphology   Final  . Immature Granulocytes 11/05/2020 4  % Final  . Abs Immature Granulocytes 11/05/2020 0.59* 0.00 - 0.07 K/uL Final   Performed at [facility], [address], [place], [place] [address]  . Glucose-Capillary 11/05/2020 113* 70 - 99 mg/dL Final   Glucose reference range applies only to samples taken after fasting for at least 8 hours.  . SURGICAL PATHOLOGY 11/05/2020    Final-Edited                   Value:Surgical Pathology CASE: [id number] PATIENT: LI WEI Nimmons Bone Marrow Report   Clinical History: CML   DIAGNOSIS:  BONE MARROW, ASPIRATE, CLOT, CORE: -Slightly hypercellular marrow with mild  granulocytic proliferation and atypical megakaryocytes -See comment  PERIPHERAL BLOOD: -Leukocytosis with neutrophilia and basophilia  COMMENT:  The combined bone marrow and peripheral blood findings are very limited but generally consistent with early involvement by chronic myeloid leukemia, chronic phase especially in the presence of previously documented positivity for BCR/ABL1 fusion as seen in peripheral blood analysis.  Correlation with cytogenetic and molecular studies is recommended.  MICROSCOPIC DESCRIPTION:  PERIPHERAL BLOOD SMEAR: The red blood cells display mild anisopoikilocytosis with mild polychromasia. The white blood cells are slightly increased in number, mostly with neutrophils some of which appears slightly hypogranular.  This is asso                         ciated with mild left shift and basophilia.  A significant blastic component is not seen.  The platelets are normal in number.  BONE MARROW ASPIRATE: Bone marrow particles present Erythroid precursors: Progressive maturation with only occasional late precursors displaying nuclear cytoplasmic dyssynchrony Granulocytic precursors: Relatively abundant with progressive maturation.  Scattered slightly hypogranular neutrophils are seen.  No increase in blastic cells identified. Megakaryocytes: Abundant with many small and/or hypolobated/unilobated forms. Lymphocytes/plasma cells: Large aggregates not present  TOUCH PREPARATIONS: A mixture of cell types present  CLOT AND BIOPSY: The sections show 50 to 60% cellularity with a mixture of myeloid cell types.  Expansile sheets  of blastic cells are not identified.  A small interstitial lymphoid aggregate mostly composed of small lymphoid cells is seen.  IRON STAIN: Iron stains are performed on a bone marrow aspirate or touch                          imprint smear and section of clot. The controls stained appropriately.       Storage Iron: Present      Ring  Sideroblasts: Absent  ADDITIONAL DATA/TESTING: The bone marrow specimen was sent for cytogenetic analysis and BCR/ABL1 quant and a separate report will follow  CELL COUNT DATA:  Bone Marrow count performed on 500 cells shows: Blasts:   0%   Myeloid:  71% Promyelocytes: 4%   Erythroid:     23% Myelocytes:    14%  Lymphocytes:   3% Metamyelocytes:     9%   Plasma cells:  2% Bands:    6% Neutrophils:   31%  M:E ratio:     3.1 Eosinophils:   6% Basophils:     1% Monocytes:     1%  Lab Data: CBC performed on 11/05/20 shows: WBC: 14.1 k/uL Neutrophils:   59% Hgb: 12.7 g/dL Lymphocytes:   27% HCT: 38.4 %    Monocytes:     4% MCV: 88.7 fL   Eosinophils:   2% RDW: 12.8 %    Basophils:     7% PLT: 309 k/uL  GROSS DESCRIPTION:  A: Aspirate smear  B: The specimen is received in B-plus fixative and consists of a 5.0 x 5.0 x 1.0 mm aggregate of red-brown                          clotted blood.  The specimen is entirely submitted in 1 cassette.  C: The specimen is received in B-plus fixative and consists of a 1.8 x 1.3 x 0.3 cm aggregate of red-brown clotted blood and tan-brown bone. Specimen is entirely submitted in 1 cassette, following decalcification with Immunocal.  Craig Staggers 11/05/2020)   Final Diagnosis performed by Susanne Greenhouse, MD.   Electronically signed 11/06/2020 Technical and / or Professional components performed at [facility], [place] [address], [place], [place] [address].  Immunohistochemistry Technical component (if applicable) was performed at [facility]. [address] [place], [place], [place] [address].   IMMUNOHISTOCHEMISTRY DISCLAIMER (if applicable): Some of these immunohistochemical stains may have been developed and the performance characteristics determine by [facility]. Some may not have been cleared or approved by the U.S. Food and Drug Administration. The FDA has dete                         rmined that  such clearance or approval is not necessary. This test is used for clinical purposes. It should not be regarded as investigational or for research. This laboratory is certified under the [place] (CLIA-88) as qualified to perform high complexity clinical laboratory testing.  The controls stained appropriately.     Assessment:  Jenna Stein is a 70 y.o. female with chronic myelogenous leukemia (CML).  She presented with an abnormal peripheral smear (3% myelocytes) on 10/09/2020.  Work-up on 10/25/2020 revealed a hematocrit of 40.8, hemoglobin 13.7, platelets 352,000, WBC 10,200. LDH was 174.  BCR-ABL showed 70% of nuclei positive for BCR/ABL1 fusion.  Flow cytometry revealed left shifted aberrant  neutrophils, aberrant monocytes and absolute basophilia.   BCR-ABL1 revealed 123.6615% b2a2, < 0.0032% b3a2, and < 0.0032% E1A2 transcript. The ABL1 e13a2 (b2a2, p210) fusion transcript was positive.    Bone marrow on 11/05/2020 showed a slightly hypercellular marrow with mild granulocytic proliferation and atypical megakaryocytes. The combined bone marrow and peripheral blood findings were very limited but were generally c/w early involvement by chronic myeloid leukemia, chronic phase especially in the presence of previously documented positivity for BCR/ABL1 fusion in peripheral blood analysis.  Cytogenetics revealed 41, XX, t(9;22)(q34.1; q11.2)[19] / 68, XX [1].  Molecular genetics showed a BCR-ABL1 translocation t(9;22) with 65.5684 % BCR-ABL1/ABL1 (IS), major breakpoint p210, log reduction 0.001.  Relative risk (RR) Sokal 0.81 (intermediate) and Hasford 684.83 (low) based on age (80), spleen (0), platelets (309), basophils (6%), eosinophils (3%), and myeloblasts (occ-1%). ELTS risk score 1.5596 (low risk) based on 0% blasts and 1.6648 (intermediate-risk) based on 1% blasts.  Limited abdominal ultrasound on 11/16/2020 revealed no splenomegaly (7.2 x 9.5 x 4.4  cm).    The patient received the [place] COVID-19 vaccine on 09/27/2019 and 10/18/2019. She received the [employer] on 04/18/2020.  Symptomatically, she feels a little tired.  Exam reveals no adenopathy or hepatosplenomegaly.  Plan: 1.   Labs today:  CBC with diff, CMP. 2.   Chronic myelogenous leukemia  Review bone marrow and molecular genetics.  Review relative risk scores (Sokal, Hasford, and ELTS).   She has relatively low risk disease.  Discuss initiation of a first generation TKI (imatinib).   Potential side effects reviewed.   Information provided.   Rx:  imatinib 400 mg po q day.  Specialty pharmacy contacted [facility]).  Discuss close monitoring on treatment to ensure she meets milestones (3, 6, and 12 month and subsequently).  Discuss referral to [facility] Katina Dung at [facility].  Patient in agreement. 3.   Discuss reviewing all supplements to ensure no interaction with imatinib. 4.   Patient to contact clinic when imatinib arrives then f/u weekly labs (CBC with diff) x 3. 5.   RTC 4 weeks after initiation of imatinib for MD assessment and labs (CBC with diff, CMP).     I discussed the assessment and treatment plan with the patient.  The patient was provided an opportunity to ask questions and all were answered.  The patient agreed with the plan and demonstrated an understanding of the instructions.  The patient was advised to call back if the symptoms worsen or if the condition fails to improve as anticipated.  I provided 33 minutes of face-to-face time during this this encounter and > 50% was spent counseling as documented under my assessment and plan. An additional 5 minutes were spent reviewing her chart (Epic and Care Everywhere) including notes, labs, and imaging studies.    Lajuan Godbee C. Mike Gip, MD, PhD    11/19/2020, 2:03 PM  I, Mirian Mo Tufford, am acting as Education administrator for [employer]. Mike Gip, MD, PhD.  I, Amal Renbarger C. Mike Gip, MD, have reviewed the above documentation for  accuracy and completeness, and I agree with the above.

## 2020-11-16 ENCOUNTER — Other Ambulatory Visit: Payer: Self-pay

## 2020-11-16 ENCOUNTER — Ambulatory Visit
Admission: RE | Admit: 2020-11-16 | Discharge: 2020-11-16 | Disposition: A | Discharge status: Home / Self Care | Payer: Medicare Other | Source: Ambulatory Visit | Attending: Hematology and Oncology | Admitting: Hematology and Oncology

## 2020-11-16 DIAGNOSIS — C9210 Chronic myeloid leukemia, BCR/ABL-positive, not having achieved remission: Secondary | ICD-10-CM | POA: Insufficient documentation

## 2020-11-17 ENCOUNTER — Other Ambulatory Visit: Payer: Self-pay | Admitting: Hematology and Oncology

## 2020-11-17 ENCOUNTER — Encounter: Payer: Self-pay | Admitting: Hematology and Oncology

## 2020-11-17 DIAGNOSIS — C9211 Chronic myeloid leukemia, BCR/ABL-positive, in remission: Secondary | ICD-10-CM | POA: Insufficient documentation

## 2020-11-17 DIAGNOSIS — C9210 Chronic myeloid leukemia, BCR/ABL-positive, not having achieved remission: Secondary | ICD-10-CM

## 2020-11-17 HISTORY — DX: Chronic myeloid leukemia, BCR/ABL-positive, not having achieved remission: C92.10

## 2020-11-19 ENCOUNTER — Encounter: Payer: Self-pay | Admitting: Hematology and Oncology

## 2020-11-19 ENCOUNTER — Telehealth: Payer: Self-pay

## 2020-11-19 ENCOUNTER — Telehealth: Payer: Self-pay | Admitting: Pharmacy Technician

## 2020-11-19 ENCOUNTER — Inpatient Hospital Stay: Payer: Medicare Other

## 2020-11-19 ENCOUNTER — Other Ambulatory Visit (HOSPITAL_COMMUNITY): Payer: Self-pay

## 2020-11-19 ENCOUNTER — Other Ambulatory Visit: Payer: Self-pay | Admitting: Hematology and Oncology

## 2020-11-19 ENCOUNTER — Other Ambulatory Visit: Payer: Self-pay

## 2020-11-19 ENCOUNTER — Inpatient Hospital Stay: Payer: Medicare Other | Attending: Hematology and Oncology | Admitting: Hematology and Oncology

## 2020-11-19 VITALS — BP 135/74 | HR 75 | Temp 98.7°F | Resp 18 | Wt 141.4 lb

## 2020-11-19 DIAGNOSIS — C9210 Chronic myeloid leukemia, BCR/ABL-positive, not having achieved remission: Secondary | ICD-10-CM | POA: Diagnosis not present

## 2020-11-19 LAB — CBC WITH DIFFERENTIAL/PLATELET
Abs Immature Granulocytes: 0.46 10*3/uL — ABNORMAL HIGH (ref 0.00–0.07)
Basophils Absolute: 0.7 10*3/uL — ABNORMAL HIGH (ref 0.0–0.1)
Basophils Relative: 7 %
Eosinophils Absolute: 0.3 10*3/uL (ref 0.0–0.5)
Eosinophils Relative: 3 %
HCT: 39.9 % (ref 36.0–46.0)
Hemoglobin: 13.5 g/dL (ref 12.0–15.0)
Immature Granulocytes: 5 %
Lymphocytes Relative: 24 %
Lymphs Abs: 2.3 10*3/uL (ref 0.7–4.0)
MCH: 29.5 pg (ref 26.0–34.0)
MCHC: 33.8 g/dL (ref 30.0–36.0)
MCV: 87.3 fL (ref 80.0–100.0)
Monocytes Absolute: 0.5 10*3/uL (ref 0.1–1.0)
Monocytes Relative: 6 %
Neutro Abs: 5.3 10*3/uL (ref 1.7–7.7)
Neutrophils Relative %: 55 %
Platelets: 342 10*3/uL (ref 150–400)
RBC: 4.57 MIL/uL (ref 3.87–5.11)
RDW: 13 % (ref 11.5–15.5)
WBC: 9.5 10*3/uL (ref 4.0–10.5)
nRBC: 0 % (ref 0.0–0.2)

## 2020-11-19 LAB — COMPREHENSIVE METABOLIC PANEL
ALT: 20 U/L (ref 0–44)
AST: 26 U/L (ref 15–41)
Albumin: 4.4 g/dL (ref 3.5–5.0)
Alkaline Phosphatase: 53 U/L (ref 38–126)
Anion gap: 8 (ref 5–15)
BUN: 14 mg/dL (ref 8–23)
CO2: 26 mmol/L (ref 22–32)
Calcium: 9.4 mg/dL (ref 8.9–10.3)
Chloride: 99 mmol/L (ref 98–111)
Creatinine, Ser: 0.86 mg/dL (ref 0.44–1.00)
GFR, Estimated: >60 mL/min (ref >60)
Glucose, Bld: 94 mg/dL (ref 70–99)
Potassium: 3.9 mmol/L (ref 3.5–5.1)
Sodium: 133 mmol/L — ABNORMAL LOW (ref 135–145)
Total Bilirubin: 0.9 mg/dL (ref 0.3–1.2)
Total Protein: 7.6 g/dL (ref 6.5–8.1)

## 2020-11-19 MED ORDER — IMATINIB MESYLATE 400 MG PO TABS
400.0000 mg | ORAL_TABLET | Freq: Every day | ORAL | 1 refills | Status: DC
  Filled 2020-11-19: qty 30, 30d supply, fill #0

## 2020-11-19 NOTE — Progress Notes (Signed)
START ON PATHWAY REGIMEN - CML     A cycle is every 28 days:     Imatinib   **Always confirm dose/schedule in your pharmacy ordering system**  Patient Characteristics: BCR-ABL Positive, Chronic Phase, First Line, Low Risk (ELTS Score ? 1.5680), Initial Therapy BCR-ABL Status: Positive Line of Therapy: First Line Risk Category: Low Risk  (ELTS Score ? 1.5680) Treatment Type: Initial Therapy Intent of Therapy: Curative Intent, Discussed with Patient

## 2020-11-19 NOTE — Progress Notes (Signed)
Patient here for biopsy results. Pt would like to know if it is ok to take selenium.

## 2020-11-19 NOTE — Telephone Encounter (Signed)
Oral Oncology Patient Advocate Encounter  After completing a benefits investigation, prior authorization for Gleevec (imatinib) is not required at this time through [place].  Patient's copay is $2489.31  [place] Patient [place] Phone [phone] Fax [phone] 11/19/2020 2:39 PM

## 2020-11-19 NOTE — Telephone Encounter (Signed)
Per checkout note from 4/4: Patient to contact clinic when imatinib arrives then f/u weekly labs (CBC with diff) x 4.

## 2020-11-19 NOTE — Patient Instructions (Signed)
Imatinib tablets What is this medicine? IMATINIB (i MAT in ib) is a medicine that targets proteins in cancer cells and stops the cancer cells from growing. It is used to treat certain leukemias, myelodysplastic syndromes, and other cancers. It is also used to treat specific digestive tract tumors called GISTs. This medicine may be used for other purposes; ask your health care provider or pharmacist if you have questions. COMMON BRAND NAME(S): Gleevec What should I tell my health care provider before I take this medicine? They need to know if you have any of these conditions:  bleeding problems  infection (especially a virus infection such as chickenpox, cold sores, or herpes)  heart disease  heart failure  kidney disease  liver disease  lung disease  stomach problems  an unusual or allergic reaction to imatinib, other medicines, foods, dyes, or preservatives  pregnant or trying to get pregnant  breast-feeding How should I use this medicine? Take this medicine by mouth with a full glass of water. Take it with food to decrease the chance of it upsetting your stomach. Do not take with grapefruit juice. Follow the directions on the prescription label. Take your medicine at regular intervals. Do not take it more often than directed. Do not stop taking except on your doctor's advice. If you have difficulty swallowing the tablets, let your doctor, pharmacist, or health care professional know. They can help you with advice. Talk to your pediatrician regarding the use of this medicine in children. While this drug may be prescribed for children as young as 1 year for selected conditions, precautions do apply. Overdosage: If you think you have taken too much of this medicine contact a poison control center or emergency room at once. NOTE: This medicine is only for you. Do not share this medicine with others. What if I miss a dose? If you miss a dose, take it as soon as you can. If it is  almost time for your next dose, take only that dose and skip your missed dose. Do not take double or extra doses. What may interact with this medicine?  antiviral medicines for HIV or AIDS  bosentan  cisapride  clarithromycin  cyclosporine  dexamethasone  diltiazem  ergot alkaloids like dihydroergotamine, ergonovine, ergotamine, methylergonovine  erythromycin  grapefruit or grapefruit juice  medicines for cholesterol like atorvastatin lovastatin, simvastatin  medicines for depression, anxiety, or psychotic disturbances  medicines for fungal infections like ketoconazole and itraconazole  medicines for irregular heart beat like amiodarone, bepridil, dofetilide, encainide, flecainide, propafenone, quinidine  medicines for seizures like carbamazepine, phenobarbital, phenytoin  medicines for sleep  NSAIDS, medicines for pain and inflammation, like ibuprofen or naproxen  pimozide  rifabutin  rifampin  sildenafil  sirolimus  St. John's wort  tacrolimus  vaccines  verapamil  warfarin Talk to your doctor or health care professional before taking any of these medicines:  acetaminophen  aspirin  ibuprofen  ketoprofen  naproxen This list may not describe all possible interactions. Give your health care provider a list of all the medicines, herbs, non-prescription drugs, or dietary supplements you use. Also tell them if you smoke, drink alcohol, or use illegal drugs. Some items may interact with your medicine. What should I watch for while using this medicine? Visit your doctor for checks on your progress. You will need to have regular blood tests while on this medicine. Report any new symptoms promptly. This medicine may cause serious skin reactions. They can happen weeks to months after starting the medicine.  Contact your health care provider right away if you notice fevers or flu-like symptoms with a rash. The rash may be red or purple and then turn into  blisters or peeling of the skin. Or, you might notice a red rash with swelling of the face, lips or lymph nodes in your neck or under your arms. Call your doctor or health care provider for advice if you get a fever, chills or sore throat, or other symptoms of a cold or flu. Do not treat yourself. This drug decreases your body's ability to fight infections. Try to avoid being around people who are sick. This medicine may increase your risk to bruise or bleed. Call your doctor or health care provider if you notice any unusual bleeding. You may get drowsy or dizzy. Do not drive, use machinery, or do anything that needs mental alertness until you know how this medicine affects you. Do not become pregnant while taking this medicine or for 14 days after stopping it. Women should inform their doctor if they wish to become pregnant or think they might be pregnant. There is a potential for serious side effects to an unborn child. Talk to your health care provider or pharmacist for more information. Do not breast-feed an infant while taking this medicine or for 1 month after stopping it. What side effects may I notice from receiving this medicine? Side effects that you should report to your doctor or health care professional as soon as possible:  allergic reactions like skin rash, itching or hives, swelling of the face, lips, or tongue  breathing problems  changes in vision  dark urine  general ill feeling or flu-like symptoms  light-colored stools  loss of appetite  low blood counts - this medicine may decrease the number of white blood cells, red blood cells and platelets. You may be at increased risk for infections and bleeding.  mouth sores  rash, fever, and swollen lymph nodes  redness, blistering, peeling or loosening of the skin, including inside the mouth  right upper belly pain  signs of decreased platelets or bleeding - bruising, pinpoint red spots on the skin, black, tarry stools,  blood in the urine, nosebleeds  signs of decreased red blood cells - unusually weak or tired, fainting spells, lightheadedness  signs of infection - fever or chills, cough, sore throat, pain or difficulty passing urine  swelling of the legs or ankles  trouble passing urine or change in the amount of urine  vomiting  yellowing of the eyes or skin Side effects that usually do not require medical attention (report to your doctor or health care professional if they continue or are bothersome):  decreased appetite  diarrhea  difficulty sleeping  headache  heartburn  joint pain  muscle cramps or pain  nausea  upset stomach This list may not describe all possible side effects. Call your doctor for medical advice about side effects. You may report side effects to FDA at 1-800-FDA-1088. Where should I keep my medicine? Keep out of reach of children. Store tablets at room temperature between 15 and 30 degrees C (59 and 86 degrees F). Protect from moisture. Keep tightly closed. Throw away any unused medicine after the expiration date. NOTE: This sheet is a summary. It may not cover all possible information. If you have questions about this medicine, talk to your doctor, pharmacist, or health care provider.  2021 Elsevier/Gold Standard (2018-11-05 14:33:57)

## 2020-11-20 ENCOUNTER — Telehealth: Payer: Self-pay | Admitting: Pharmacist

## 2020-11-20 DIAGNOSIS — C9210 Chronic myeloid leukemia, BCR/ABL-positive, not having achieved remission: Secondary | ICD-10-CM

## 2020-11-20 NOTE — Telephone Encounter (Signed)
Error

## 2020-11-20 NOTE — Telephone Encounter (Signed)
Oral Oncology Pharmacist Encounter  Received new prescription for Gleevec (imatinib) for the treatment of newly diagnosed CML, planned duration until disease progression or unacceptable drug toxicity.  CMP/CBC from 11/19/20 assessed, no relevant lab abnormalities. Prescription dose and frequency assessed.   Current medication list in Epic reviewed, one DDIs with imatinib identified: -Atorvastatin: imatinib may increase the concentration of atorvastatin. No baseline dose adjustment needed. Monitor for increased atorvastatin adverse effects (eg, myopathy, rhabdomyolysis).  Evaluated chart and no patient barriers to medication adherence identified.   Prescription has been e-scribed to [place] for benefits analysis and approval.  Oral Oncology Clinic will continue to follow for insurance authorization, copayment issues, initial counseling and start date.  Patient agreed to treatment on 11/19/20 per MD documentation.  Darl Pikes, PharmD, BCPS, BCOP, CPP Hematology/Oncology Clinical Pharmacist Practitioner [facility] [phone]  11/20/2020 9:07 AM

## 2020-11-21 ENCOUNTER — Telehealth: Payer: Self-pay

## 2020-11-21 ENCOUNTER — Other Ambulatory Visit (HOSPITAL_COMMUNITY): Payer: Self-pay

## 2020-11-21 NOTE — Telephone Encounter (Signed)
-----   Message from Lequita Asal, MD sent at 11/21/2020 12:50 PM EDT ----- Regarding: Referral to Dr Katina Dung  Patient and I discussed.  I spoke with Dr Katina Dung who agrees to see.  His coordinator at [facility] is [place]: [phone].  Phone: [phone].  Please call and send records.  Thanks,  M

## 2020-11-21 NOTE — Telephone Encounter (Signed)
faxed

## 2020-11-22 MED ORDER — IMATINIB MESYLATE 400 MG PO TABS: 400.0000 mg | ORAL_TABLET | Freq: Every day | ORAL | 1 refills | Status: DC

## 2020-11-22 NOTE — Telephone Encounter (Signed)
[place] pharmacy help desk [phone]) to see options for patient to receive Imatinib at a lower cost.  Rep stated that patient could receive Imatinib through the [facility]s [facility] Mail program at a $0 copay.  This will not go towards their OOP max, but patient will be able to access medication without cost.  We will follow up with [facility] to verify copay is $0 and patient can receive medication.  It can take 14-21 days for prescription to be processed and delivered.  [place] Patient [place] Phone [phone] Fax [phone] 11/22/2020 12:02 PM

## 2020-11-22 NOTE — Telephone Encounter (Signed)
Oral Chemotherapy Pharmacist Encounter  Patient Education I spoke with patient for overview of new oral chemotherapy medication: Gleevec (imatinib) for the treatment of newly diagnosed CML, planned duration until disease progression or unacceptable drug toxicity.   Pt is doing well. Counseled patient on administration, dosing, side effects, monitoring, drug-food interactions, safe handling, storage, and disposal. Patient will take 1 tablet (400 mg total) by mouth daily. Take with meals and large glass of water.  Side effects include but not limited to: rash/itchy skin/ diarrhea, edema, decreased wbc, fatigue.    Reviewed with patient importance of keeping a medication schedule and plan for any missed doses.  After discussion with patient no patient barriers to medication adherence identified.   Jenna Stein voiced understanding and appreciation. All questions answered. Medication handout provided.  Her imatinib will be coming from [facility] [facility], she know to call by Wednesday of next week if she has not received a call from [facility].  She also mentioned her referral appt at [facility] was set-up for 11/30/20.  Provided patient with Oral [place] [facility] phone number. Patient knows to call the office with questions or concerns. Oral Chemotherapy Navigation Clinic will continue to follow.  Jenna Stein, PharmD, BCPS, BCOP, CPP Hematology/Oncology Clinical Pharmacist Practitioner [facility] [place] [phone]  11/22/2020 1:39 PM

## 2020-11-23 NOTE — Telephone Encounter (Signed)
FYI

## 2020-11-28 ENCOUNTER — Encounter: Payer: Self-pay | Admitting: Pharmacist

## 2020-11-28 NOTE — Progress Notes (Signed)
[place]  [phone] [phone]  Patient Care Team: Glean Hess, MD as PCP - General (Internal Medicine) [facility] (Ophthalmology)   Name of the patient: Jenna Stein  [id number]  21-Nov-1950   Date of visit: 11/28/20  HPI: Patient is a 70 y.o. female with newly diagnosed CML.  Reason for Consult: Gleevec (imatinib) oral chemotherapy education. Education conducted in person at the [facility].   PAST MEDICAL HISTORY: Past Medical History:  Diagnosis Date  . Allergy   . Anxiety   . Arthritis 2010  . Chronic myeloid leukemia, BCR/ABL-positive, not having achieved remission ([place]) 11/17/2020  . Depression   . Diabetes mellitus without complication ([place])   . GERD (gastroesophageal reflux disease)   . Hyperlipidemia   . Hypertension     HEMATOLOGY/ONCOLOGY HISTORY:  Oncology History   No history exists.    ALLERGIES:  is allergic to clonazepam and trazodone.  MEDICATIONS:  Current Outpatient Medications  Medication Sig Dispense Refill  . acyclovir (ZOVIRAX) 200 MG capsule Take 1 capsule (200 mg total) by mouth 5 (five) times daily. 90 capsule 1  . atenolol (TENORMIN) 50 MG tablet Take 1 tablet (50 mg total) by mouth daily. 90 tablet 1  . atorvastatin (LIPITOR) 10 MG tablet Take 1 tablet (10 mg total) by mouth at bedtime. 90 tablet 1  . Bilberry 100 MG CAPS Take by mouth.    . celecoxib (CELEBREX) 200 MG capsule TAKE 1 CAPSULE BY MOUTH EVERY DAY 90 capsule 3  . Coenzyme Q10 (CO Q 10) 100 MG CAPS Take 2 capsules by mouth daily.     [name] estazolam (PROSOM) 2 MG tablet TAKE 1 TABLET (2 MG TOTAL) BY MOUTH AT BEDTIME. 30 tablet 5  . glucose blood (FREESTYLE LITE) test strip Use to test BS twice daily 100 each 12  . hydrochlorothiazide (HYDRODIURIL) 12.5 MG tablet Take 1 tablet (12.5 mg total) by mouth daily. 90 tablet 3  . imatinib (GLEEVEC) 400 MG tablet Take 1 tablet (400 mg total) by  mouth daily. Take with meals and large glass of water.Caution:Chemotherapy. 30 tablet 1  . Lancets (FREESTYLE) lancets Use to test BS once daily 100 each 12  . Lutein 20 MG TABS Take 1 tablet by mouth daily.     . metFORMIN (GLUCOPHAGE) 1000 MG tablet Take 1 tablet (1,000 mg total) by mouth 2 (two) times daily with a meal. 180 tablet 1  . metroNIDAZOLE (METROGEL VAGINAL) 0.75 % vaginal gel Place 1 Applicatorful vaginally 2 (two) times daily. For 5 days for recurrence of vaginitis 70 g 1  . MULTIPLE VITAMIN PO 1 tablet daily.     [name] olopatadine (PATANOL) 0.1 % ophthalmic solution Apply to eye. (Patient not taking: No sig reported)    . Omega-3 Fatty Acids (FISH OIL PEARLS PO) Take by mouth.    [name] omeprazole (PRILOSEC) 20 MG capsule Take 1 capsule (20 mg total) by mouth daily. 90 capsule 1  . PARoxetine (PAXIL) 20 MG tablet Take 1 tablet (20 mg total) by mouth daily. 90 tablet 1  . sitaGLIPtin (JANUVIA) 100 MG tablet Take 1 tablet (100 mg total) by mouth daily. 90 tablet 1  . triamcinolone cream (KENALOG) 0.5 % APPLY 1 APPLICATION TOPICALLY 3 (THREE) TIMES DAILY. TO RASH ON ABDOMEN (Patient not taking: No sig reported) 30 g 0  . TURMERIC PO Take 800 mg by mouth.    . valsartan (DIOVAN) 160 MG tablet Take 1 tablet (  160 mg total) by mouth daily. 90 tablet 1  . Vitamin D, Ergocalciferol, (DRISDOL) 1.25 MG (50000 UNIT) CAPS capsule Take 50,000 Units by mouth every 7 (seven) days.    . vitamin E (VITAMIN E) 400 UNIT capsule Take 1 capsule (400 Units total) by mouth daily. 1 capsule 0  . zinc gluconate 50 MG tablet Take 50 mg by mouth daily.     No current facility-administered medications for this visit.    VITAL SIGNS: There were no vitals taken for this visit. There were no vitals filed for this visit.  Estimated body mass index is 24.28 kg/m as calculated from the following:   Height as of 11/05/20: 5\' 4"  (1.626 m).   Weight as of 11/19/20: 64.1 kg (141 lb 6.8 oz).  LABS: CBC:    Component  Value Date/Time   WBC 9.5 11/19/2020 1410   HGB 13.5 11/19/2020 1410   HGB 13.7 10/09/2020 1114   HCT 39.9 11/19/2020 1410   HCT 41.4 10/09/2020 1114   PLT 342 11/19/2020 1410   PLT 337 10/09/2020 1114   MCV 87.3 11/19/2020 1410   MCV 90 10/09/2020 1114   NEUTROABS 5.3 11/19/2020 1410   NEUTROABS 3.7 10/09/2020 1114   LYMPHSABS 2.3 11/19/2020 1410   LYMPHSABS 3.7 (H) 10/09/2020 1114   MONOABS 0.5 11/19/2020 1410   EOSABS 0.3 11/19/2020 1410   EOSABS 0.6 (H) 10/09/2020 1114   BASOSABS 0.7 (H) 11/19/2020 1410   BASOSABS 1.1 (H) 10/09/2020 1114   Comprehensive Metabolic Panel:    Component Value Date/Time   NA 133 (L) 11/19/2020 1410   NA 140 10/09/2020 1114   K 3.9 11/19/2020 1410   CL 99 11/19/2020 1410   CO2 26 11/19/2020 1410   BUN 14 11/19/2020 1410   BUN 15 10/09/2020 1114   CREATININE 0.86 11/19/2020 1410   GLUCOSE 94 11/19/2020 1410   CALCIUM 9.4 11/19/2020 1410   AST 26 11/19/2020 1410   ALT 20 11/19/2020 1410   ALKPHOS 53 11/19/2020 1410   BILITOT 0.9 11/19/2020 1410   BILITOT 0.5 10/09/2020 1114   PROT 7.6 11/19/2020 1410   PROT 7.5 10/09/2020 1114   ALBUMIN 4.4 11/19/2020 1410   ALBUMIN 4.7 10/09/2020 1114     Assessment and Plan-  Start plan: She will get started with imatinib 400mg  samples today while she await shipment from [facility]   Patient Education I spoke with patient and her husband Konrad Dolores for overview of new oral chemotherapy medication: Gleevec (imatinib)for the treatment of newly diagnosed CML, planned durationuntil disease progression or unacceptable drug toxicity.   Provided them with NCCN CML Patient Guidelines, a print out of her most recent BCR-ABL labs, and a molecular response semantic. Review all documents, explained her her response to treatment will be determined, and answer all of their questions related to these documents  Administration: Counseled patient on administration, dosing, side effects, monitoring, drug-food  interactions, safe handling, storage, and disposal. Patient will take 1 tablet (400 mg total) by mouth daily. Take with meals and large glass of water.  She plans on taking her imatinib with food in the evening.  Side Effects: Side effects include but not limited to: rash/itchy skin, diarrhea, N/V, fatigue, decreased wbc/hgb/plt, edema.    -Bowel movements: usually Ms. Steichen has to management constipation, but she is still going to pick up some loperamide to have on hand if needed.  Adherence: After discussion with patient no patient barriers to medication adherence identified.  Reviewed with patient importance of  keeping a medication schedule and plan for any missed doses.  Mr. and Mrs. Hancox voiced understanding and appreciation. All questions answered. Medication handout provided.  Provided patient with Oral [place] [facility] [facility] phone number. Patient knows to call the office with questions or concerns. Oral Chemotherapy Navigation Clinic will continue to follow.  Patient expressed understanding and was in agreement with this plan. She also understands that She can call clinic at any time with any questions, concerns, or complaints.   Medication Access Issues: Patient called [facility] to check on the status of her imatinib, they stated they had the order but they were still processing it.  Of note: - Ms. Rispoli is going out of the country in the fall for a month. I asked her to let use know in advance so we can make sure she has enough medication to make it through her trip. - She has two daughters and one of them is a Marine scientist. - She likes to garden and keeps very active, she knows our goal is to treatment her and allow her to maintain her normal level of activity.  Thank you for allowing me to participate in the care of this patient.   Time Total: 45 mins  Visit consisted of counseling and education on dealing with issues of symptom management in the setting of serious and  potentially life-threatening illness.Greater than 50%  of this time was spent counseling and coordinating care related to the above assessment and plan.  Signed by: Darl Pikes, PharmD, BCPS, Salley Slaughter, CPP Hematology/Oncology Clinical Pharmacist Practitioner [facility] [phone]  11/28/2020 3:10 PM

## 2020-11-30 DIAGNOSIS — C9210 Chronic myeloid leukemia, BCR/ABL-positive, not having achieved remission: Secondary | ICD-10-CM | POA: Diagnosis not present

## 2020-12-04 NOTE — Telephone Encounter (Signed)
Called [facility] Jenna Stein) to check delivery status of Imatinib.  Patients medication has been mailed and she should receive it by the end of this week. [facility] uses [employer] to deliver its medications.  Jenna Stein [place] Phone [phone] Fax [phone] 12/04/2020 1:03 PM

## 2020-12-05 ENCOUNTER — Inpatient Hospital Stay: Payer: Medicare Other

## 2020-12-05 ENCOUNTER — Other Ambulatory Visit: Payer: Self-pay

## 2020-12-05 DIAGNOSIS — C9210 Chronic myeloid leukemia, BCR/ABL-positive, not having achieved remission: Secondary | ICD-10-CM | POA: Diagnosis not present

## 2020-12-05 LAB — CBC WITH DIFFERENTIAL/PLATELET
Abs Immature Granulocytes: 0.05 10*3/uL (ref 0.00–0.07)
Basophils Absolute: 0.6 10*3/uL — ABNORMAL HIGH (ref 0.0–0.1)
Basophils Relative: 8 %
Eosinophils Absolute: 0.3 10*3/uL (ref 0.0–0.5)
Eosinophils Relative: 4 %
HCT: 38.9 % (ref 36.0–46.0)
Hemoglobin: 13.1 g/dL (ref 12.0–15.0)
Immature Granulocytes: 1 %
Lymphocytes Relative: 34 %
Lymphs Abs: 2.6 10*3/uL (ref 0.7–4.0)
MCH: 29.4 pg (ref 26.0–34.0)
MCHC: 33.7 g/dL (ref 30.0–36.0)
MCV: 87.4 fL (ref 80.0–100.0)
Monocytes Absolute: 0.5 10*3/uL (ref 0.1–1.0)
Monocytes Relative: 6 %
Neutro Abs: 3.6 10*3/uL (ref 1.7–7.7)
Neutrophils Relative %: 47 %
Platelets: 310 10*3/uL (ref 150–400)
RBC: 4.45 MIL/uL (ref 3.87–5.11)
RDW: 12.9 % (ref 11.5–15.5)
Smear Review: NORMAL
WBC: 7.5 10*3/uL (ref 4.0–10.5)
nRBC: 0 % (ref 0.0–0.2)

## 2020-12-05 LAB — PATHOLOGIST SMEAR REVIEW

## 2020-12-06 ENCOUNTER — Ambulatory Visit
Admission: RE | Admit: 2020-12-06 | Discharge: 2020-12-06 | Disposition: A | Discharge status: Home / Self Care | Payer: Medicare Other | Source: Ambulatory Visit | Attending: Internal Medicine | Admitting: Internal Medicine

## 2020-12-06 ENCOUNTER — Other Ambulatory Visit: Payer: Self-pay

## 2020-12-06 DIAGNOSIS — Z780 Asymptomatic menopausal state: Secondary | ICD-10-CM | POA: Insufficient documentation

## 2020-12-06 DIAGNOSIS — M85852 Other specified disorders of bone density and structure, left thigh: Secondary | ICD-10-CM | POA: Diagnosis not present

## 2020-12-06 DIAGNOSIS — Z1231 Encounter for screening mammogram for malignant neoplasm of breast: Secondary | ICD-10-CM | POA: Insufficient documentation

## 2020-12-10 DIAGNOSIS — E119 Type 2 diabetes mellitus without complications: Secondary | ICD-10-CM | POA: Diagnosis not present

## 2020-12-10 LAB — HM DIABETES EYE EXAM

## 2020-12-11 ENCOUNTER — Other Ambulatory Visit: Payer: Self-pay

## 2020-12-11 DIAGNOSIS — C9210 Chronic myeloid leukemia, BCR/ABL-positive, not having achieved remission: Secondary | ICD-10-CM

## 2020-12-12 ENCOUNTER — Other Ambulatory Visit: Payer: Self-pay

## 2020-12-12 ENCOUNTER — Inpatient Hospital Stay: Payer: Medicare Other | Admitting: Oncology

## 2020-12-12 DIAGNOSIS — C9210 Chronic myeloid leukemia, BCR/ABL-positive, not having achieved remission: Secondary | ICD-10-CM

## 2020-12-12 LAB — CBC WITH DIFFERENTIAL/PLATELET
Abs Immature Granulocytes: 0.02 10*3/uL (ref 0.00–0.07)
Basophils Absolute: 0.1 10*3/uL (ref 0.0–0.1)
Basophils Relative: 2 %
Eosinophils Absolute: 0.9 10*3/uL — ABNORMAL HIGH (ref 0.0–0.5)
Eosinophils Relative: 18 %
HCT: 38.9 % (ref 36.0–46.0)
Hemoglobin: 13 g/dL (ref 12.0–15.0)
Immature Granulocytes: 0 %
Lymphocytes Relative: 21 %
Lymphs Abs: 1.1 10*3/uL (ref 0.7–4.0)
MCH: 29.1 pg (ref 26.0–34.0)
MCHC: 33.4 g/dL (ref 30.0–36.0)
MCV: 87 fL (ref 80.0–100.0)
Monocytes Absolute: 0.3 10*3/uL (ref 0.1–1.0)
Monocytes Relative: 5 %
Neutro Abs: 2.7 10*3/uL (ref 1.7–7.7)
Neutrophils Relative %: 54 %
Platelets: 187 10*3/uL (ref 150–400)
RBC: 4.47 MIL/uL (ref 3.87–5.11)
RDW: 13.1 % (ref 11.5–15.5)
WBC: 5.1 10*3/uL (ref 4.0–10.5)
nRBC: 0 % (ref 0.0–0.2)

## 2020-12-12 LAB — COMPREHENSIVE METABOLIC PANEL
ALT: 41 U/L (ref 0–44)
AST: 39 U/L (ref 15–41)
Albumin: 4.1 g/dL (ref 3.5–5.0)
Alkaline Phosphatase: 53 U/L (ref 38–126)
Anion gap: 9 (ref 5–15)
BUN: 12 mg/dL (ref 8–23)
CO2: 25 mmol/L (ref 22–32)
Calcium: 9.2 mg/dL (ref 8.9–10.3)
Chloride: 95 mmol/L — ABNORMAL LOW (ref 98–111)
Creatinine, Ser: 0.99 mg/dL (ref 0.44–1.00)
GFR, Estimated: >60 mL/min (ref >60)
Glucose, Bld: 126 mg/dL — ABNORMAL HIGH (ref 70–99)
Potassium: 3.9 mmol/L (ref 3.5–5.1)
Sodium: 129 mmol/L — ABNORMAL LOW (ref 135–145)
Total Bilirubin: 1.1 mg/dL (ref 0.3–1.2)
Total Protein: 7 g/dL (ref 6.5–8.1)

## 2020-12-14 ENCOUNTER — Other Ambulatory Visit: Payer: Self-pay

## 2020-12-14 DIAGNOSIS — C9210 Chronic myeloid leukemia, BCR/ABL-positive, not having achieved remission: Secondary | ICD-10-CM

## 2020-12-16 ENCOUNTER — Encounter: Payer: Self-pay | Admitting: Internal Medicine

## 2020-12-19 ENCOUNTER — Encounter: Payer: Self-pay | Admitting: Internal Medicine

## 2020-12-19 ENCOUNTER — Inpatient Hospital Stay: Payer: Medicare Other | Attending: Oncology | Admitting: Oncology

## 2020-12-19 ENCOUNTER — Other Ambulatory Visit: Payer: Self-pay

## 2020-12-19 DIAGNOSIS — C9210 Chronic myeloid leukemia, BCR/ABL-positive, not having achieved remission: Secondary | ICD-10-CM | POA: Diagnosis not present

## 2020-12-19 DIAGNOSIS — E871 Hypo-osmolality and hyponatremia: Secondary | ICD-10-CM | POA: Diagnosis not present

## 2020-12-19 DIAGNOSIS — D649 Anemia, unspecified: Secondary | ICD-10-CM | POA: Diagnosis not present

## 2020-12-19 LAB — CBC WITH DIFFERENTIAL/PLATELET
Abs Immature Granulocytes: 0.01 10*3/uL (ref 0.00–0.07)
Basophils Absolute: 0.1 10*3/uL (ref 0.0–0.1)
Basophils Relative: 3 %
Eosinophils Absolute: 0.7 10*3/uL — ABNORMAL HIGH (ref 0.0–0.5)
Eosinophils Relative: 17 %
HCT: 34.4 % — ABNORMAL LOW (ref 36.0–46.0)
Hemoglobin: 11.8 g/dL — ABNORMAL LOW (ref 12.0–15.0)
Immature Granulocytes: 0 %
Lymphocytes Relative: 35 %
Lymphs Abs: 1.3 10*3/uL (ref 0.7–4.0)
MCH: 29.8 pg (ref 26.0–34.0)
MCHC: 34.3 g/dL (ref 30.0–36.0)
MCV: 86.9 fL (ref 80.0–100.0)
Monocytes Absolute: 0.3 10*3/uL (ref 0.1–1.0)
Monocytes Relative: 8 %
Neutro Abs: 1.4 10*3/uL — ABNORMAL LOW (ref 1.7–7.7)
Neutrophils Relative %: 37 %
Platelets: 191 10*3/uL (ref 150–400)
RBC: 3.96 MIL/uL (ref 3.87–5.11)
RDW: 12.9 % (ref 11.5–15.5)
WBC: 3.8 10*3/uL — ABNORMAL LOW (ref 4.0–10.5)
nRBC: 0 % (ref 0.0–0.2)

## 2020-12-20 ENCOUNTER — Telehealth: Payer: Self-pay

## 2020-12-20 NOTE — Progress Notes (Signed)
Patient was to come back to clinic to have labs drawn after she initiated new treatment for CML.  Labs show a decreased hemoglobin and white blood cell count which is an expected finding after initiating treatment.  We just let her know to she is at an increased risk for infections due to her low white count.  Faythe Casa, NP 12/20/2020 11:46 AM

## 2020-12-20 NOTE — Progress Notes (Signed)
She really needs to hydrate with electrolytes (Gatorade....etc). She will be back on 12/26/20 for repeat labs so we can re-evaluate then.   Faythe Casa, NP 12/20/2020 1:32 PM

## 2020-12-20 NOTE — Progress Notes (Signed)
Hi Catherine, I forgot to look at her CMP.  Her sodium level is low at 129.  Will you see if she is staying hydrated or having nausea, vomiting or diarrhea?  I think she will need to come in sooner than next Wednesday for IV fluids.  Let me know what she says and we can get her scheduled for IV fluids on Monday.  Faythe Casa, NP 12/20/2020 11:47 AM

## 2020-12-20 NOTE — Telephone Encounter (Signed)
Per Sonia Baller, pts sodium levels are low and advised pt to drink lots of electrolytes. With pt stating she is not in discomfort or experiencing any sx. Sonia Baller said we can keep her scheduled appt for 5/11 to have levels rechecked. Pt understood suggestion and will return for labs.

## 2020-12-25 ENCOUNTER — Other Ambulatory Visit: Payer: Self-pay

## 2020-12-25 DIAGNOSIS — C9210 Chronic myeloid leukemia, BCR/ABL-positive, not having achieved remission: Secondary | ICD-10-CM

## 2020-12-26 ENCOUNTER — Ambulatory Visit: Payer: Medicare Other | Admitting: Oncology

## 2020-12-26 ENCOUNTER — Other Ambulatory Visit: Payer: Self-pay

## 2020-12-26 ENCOUNTER — Inpatient Hospital Stay: Payer: Medicare Other | Admitting: Oncology

## 2020-12-26 DIAGNOSIS — C9210 Chronic myeloid leukemia, BCR/ABL-positive, not having achieved remission: Secondary | ICD-10-CM | POA: Diagnosis not present

## 2020-12-26 DIAGNOSIS — D649 Anemia, unspecified: Secondary | ICD-10-CM | POA: Diagnosis not present

## 2020-12-26 DIAGNOSIS — E871 Hypo-osmolality and hyponatremia: Secondary | ICD-10-CM | POA: Diagnosis not present

## 2020-12-26 LAB — CBC WITH DIFFERENTIAL/PLATELET
Abs Immature Granulocytes: 0 10*3/uL (ref 0.00–0.07)
Basophils Absolute: 0.1 10*3/uL (ref 0.0–0.1)
Basophils Relative: 3 %
Eosinophils Absolute: 0.3 10*3/uL (ref 0.0–0.5)
Eosinophils Relative: 11 %
HCT: 34.5 % — ABNORMAL LOW (ref 36.0–46.0)
Hemoglobin: 11.4 g/dL — ABNORMAL LOW (ref 12.0–15.0)
Immature Granulocytes: 0 %
Lymphocytes Relative: 43 %
Lymphs Abs: 1.2 10*3/uL (ref 0.7–4.0)
MCH: 29.1 pg (ref 26.0–34.0)
MCHC: 33 g/dL (ref 30.0–36.0)
MCV: 88 fL (ref 80.0–100.0)
Monocytes Absolute: 0.2 10*3/uL (ref 0.1–1.0)
Monocytes Relative: 8 %
Neutro Abs: 1 10*3/uL — ABNORMAL LOW (ref 1.7–7.7)
Neutrophils Relative %: 35 %
Platelets: 165 10*3/uL (ref 150–400)
RBC: 3.92 MIL/uL (ref 3.87–5.11)
RDW: 13.4 % (ref 11.5–15.5)
WBC: 2.8 10*3/uL — ABNORMAL LOW (ref 4.0–10.5)
nRBC: 0 % (ref 0.0–0.2)

## 2020-12-26 LAB — COMPREHENSIVE METABOLIC PANEL
ALT: 62 U/L — ABNORMAL HIGH (ref 0–44)
AST: 53 U/L — ABNORMAL HIGH (ref 15–41)
Albumin: 4.2 g/dL (ref 3.5–5.0)
Alkaline Phosphatase: 60 U/L (ref 38–126)
Anion gap: 7 (ref 5–15)
BUN: 12 mg/dL (ref 8–23)
CO2: 25 mmol/L (ref 22–32)
Calcium: 9 mg/dL (ref 8.9–10.3)
Chloride: 101 mmol/L (ref 98–111)
Creatinine, Ser: 0.9 mg/dL (ref 0.44–1.00)
GFR, Estimated: >60 mL/min (ref >60)
Glucose, Bld: 114 mg/dL — ABNORMAL HIGH (ref 70–99)
Potassium: 3.7 mmol/L (ref 3.5–5.1)
Sodium: 133 mmol/L — ABNORMAL LOW (ref 135–145)
Total Bilirubin: 0.9 mg/dL (ref 0.3–1.2)
Total Protein: 6.9 g/dL (ref 6.5–8.1)

## 2020-12-26 NOTE — Progress Notes (Signed)
Hey Dr. Tasia Catchings,   I believe you will be taking over care for this patient. She recently started Gleevac and is scheduled to see you in a week. She is becoming more and more neutropenic. ANC is 100. Should we have her HOLD the medication for a few days or until she see's you?   Faythe Casa, NP 12/26/2020 3:42 PM

## 2021-01-02 ENCOUNTER — Other Ambulatory Visit: Payer: Self-pay | Admitting: Oncology

## 2021-01-02 DIAGNOSIS — C9210 Chronic myeloid leukemia, BCR/ABL-positive, not having achieved remission: Secondary | ICD-10-CM

## 2021-01-03 ENCOUNTER — Inpatient Hospital Stay (HOSPITAL_BASED_OUTPATIENT_CLINIC_OR_DEPARTMENT_OTHER): Payer: Medicare Other | Admitting: Oncology

## 2021-01-03 ENCOUNTER — Encounter: Payer: Self-pay | Admitting: Oncology

## 2021-01-03 ENCOUNTER — Inpatient Hospital Stay: Payer: Medicare Other

## 2021-01-03 ENCOUNTER — Other Ambulatory Visit: Payer: Self-pay

## 2021-01-03 VITALS — BP 154/82 | HR 71 | Temp 97.4°F | Resp 18 | Wt 142.1 lb

## 2021-01-03 DIAGNOSIS — D701 Agranulocytosis secondary to cancer chemotherapy: Secondary | ICD-10-CM

## 2021-01-03 DIAGNOSIS — T451X5A Adverse effect of antineoplastic and immunosuppressive drugs, initial encounter: Secondary | ICD-10-CM

## 2021-01-03 DIAGNOSIS — Z5111 Encounter for antineoplastic chemotherapy: Secondary | ICD-10-CM

## 2021-01-03 DIAGNOSIS — D649 Anemia, unspecified: Secondary | ICD-10-CM | POA: Diagnosis not present

## 2021-01-03 DIAGNOSIS — R7401 Elevation of levels of liver transaminase levels: Secondary | ICD-10-CM

## 2021-01-03 DIAGNOSIS — E871 Hypo-osmolality and hyponatremia: Secondary | ICD-10-CM | POA: Diagnosis not present

## 2021-01-03 DIAGNOSIS — E0789 Other specified disorders of thyroid: Secondary | ICD-10-CM

## 2021-01-03 DIAGNOSIS — C9210 Chronic myeloid leukemia, BCR/ABL-positive, not having achieved remission: Secondary | ICD-10-CM

## 2021-01-03 DIAGNOSIS — R799 Abnormal finding of blood chemistry, unspecified: Secondary | ICD-10-CM

## 2021-01-03 LAB — CBC WITH DIFFERENTIAL/PLATELET
Abs Immature Granulocytes: 0 10*3/uL (ref 0.00–0.07)
Basophils Absolute: 0.1 10*3/uL (ref 0.0–0.1)
Basophils Relative: 2 %
Eosinophils Absolute: 0.2 10*3/uL (ref 0.0–0.5)
Eosinophils Relative: 6 %
HCT: 32.9 % — ABNORMAL LOW (ref 36.0–46.0)
Hemoglobin: 11.2 g/dL — ABNORMAL LOW (ref 12.0–15.0)
Immature Granulocytes: 0 %
Lymphocytes Relative: 48 %
Lymphs Abs: 1.4 10*3/uL (ref 0.7–4.0)
MCH: 30 pg (ref 26.0–34.0)
MCHC: 34 g/dL (ref 30.0–36.0)
MCV: 88.2 fL (ref 80.0–100.0)
Monocytes Absolute: 0.2 10*3/uL (ref 0.1–1.0)
Monocytes Relative: 8 %
Neutro Abs: 1.1 10*3/uL — ABNORMAL LOW (ref 1.7–7.7)
Neutrophils Relative %: 36 %
Platelets: 156 10*3/uL (ref 150–400)
RBC: 3.73 MIL/uL — ABNORMAL LOW (ref 3.87–5.11)
RDW: 13.6 % (ref 11.5–15.5)
WBC: 3 10*3/uL — ABNORMAL LOW (ref 4.0–10.5)
nRBC: 0 % (ref 0.0–0.2)

## 2021-01-03 LAB — COMPREHENSIVE METABOLIC PANEL
ALT: 61 U/L — ABNORMAL HIGH (ref 0–44)
AST: 47 U/L — ABNORMAL HIGH (ref 15–41)
Albumin: 4.2 g/dL (ref 3.5–5.0)
Alkaline Phosphatase: 66 U/L (ref 38–126)
Anion gap: 8 (ref 5–15)
BUN: 13 mg/dL (ref 8–23)
CO2: 24 mmol/L (ref 22–32)
Calcium: 9 mg/dL (ref 8.9–10.3)
Chloride: 98 mmol/L (ref 98–111)
Creatinine, Ser: 0.95 mg/dL (ref 0.44–1.00)
GFR, Estimated: >60 mL/min (ref >60)
Glucose, Bld: 123 mg/dL — ABNORMAL HIGH (ref 70–99)
Potassium: 3.6 mmol/L (ref 3.5–5.1)
Sodium: 130 mmol/L — ABNORMAL LOW (ref 135–145)
Total Bilirubin: 1 mg/dL (ref 0.3–1.2)
Total Protein: 7.1 g/dL (ref 6.5–8.1)

## 2021-01-03 LAB — TSH: TSH: 1.011 u[IU]/mL (ref 0.350–4.500)

## 2021-01-03 NOTE — Progress Notes (Signed)
Pt reports having occasional diarrhea due to taking the [place]. Pt also reports blood sugar and blood pressure fluctuating since she started Gleevec.

## 2021-01-04 ENCOUNTER — Other Ambulatory Visit: Payer: Self-pay | Admitting: *Deleted

## 2021-01-04 ENCOUNTER — Telehealth: Payer: Self-pay

## 2021-01-04 DIAGNOSIS — C9210 Chronic myeloid leukemia, BCR/ABL-positive, not having achieved remission: Secondary | ICD-10-CM

## 2021-01-04 MED ORDER — IMATINIB MESYLATE 400 MG PO TABS: 400.0000 mg | ORAL_TABLET | Freq: Every day | ORAL | 1 refills | Status: DC

## 2021-01-04 NOTE — Telephone Encounter (Signed)
-----   Message from Earlie Server, MD sent at 01/04/2021  3:25 PM EDT ----- Please arrange her to do cbc lipase in 1 week and cbc in 2 weeks, thursdays at [facility].  Lab MD [place]   cbc cmp lipase in 4 weeks  ( I ordered lipase yesterday but not done)  I called her and she knows the plan.

## 2021-01-04 NOTE — Telephone Encounter (Signed)
Orders have been entered.  The Lipase order that was entered on 01/03/21 is for the wrong resulting agency so the order did not cross over to our labs.

## 2021-01-04 NOTE — Telephone Encounter (Signed)
Please schedule as MD request and inform patient of appt details.

## 2021-01-07 NOTE — Progress Notes (Signed)
[facility]  [address], [address] [place], [place] [address] Phone: [phone]  Fax: [phone]   Clinic Day:  01/07/2021  Referring physician: Glean Hess, MD  Chief Complaint: Jenna Stein is a 70 y.o. female with chronic phase CML who is seen for management of CML.  PERTINENT ONCOLOGY HISTORY Jenna Stein is a 70 y.o.afemale who has above oncology history reviewed by me today presented for follow up visit for management of CML Patient previously followed up by Dr.Corcoran, patient switched care to me on 01/07/21 Extensive medical record review was performed by me  10/09/2020 abnormal peripheral smear (3% myelocytes) 10/25/2020  lab work-up revealed a hematocrit of 40.8, hemoglobin 13.7, platelets 352,000, WBC 10,200. LDH was 174.  BCR-ABL showed 70% of nuclei positive for BCR/ABL1 fusion.  Flow cytometry revealed left shifted aberrant neutrophils, aberrant monocytes and absolute basophilia.   BCR-ABL1 revealed 123.6615% b2a2, < 0.0032% b3a2, and < 0.0032% E1A2 transcript. The ABL1 e13a2 (b2a2, p210) fusion transcript was positive.   11/05/2020 bone marrow biopsy showed  slightly hypercellular marrow with mild granulocytic proliferation and atypical megakaryocytes. The combined bone marrow and peripheral blood findings were very limited but were generally c/w early involvement by chronic myeloid leukemia, chronic phase especially in the presence of previously documented positivity for BCR/ABL1 fusion in peripheral blood analysis.  Cytogenetics revealed 3, XX, t(9;22)(q34.1; q11.2)[19] / 80, XX [1].  Molecular genetics showed a BCR-ABL1 translocation t(9;22) with 65.5684 % BCR-ABL1/ABL1 (IS), major breakpoint p210, log reduction 0.001.  11/16/2020 abdominal ultrasound showed no splenomegaly (7.2 x 9.5 x 4.4 cm).   Relative risk (RR) Sokal 0.81 (intermediate) and Hasford 684.83 (low) based on age (62), spleen (0), platelets (309), basophils (6%),  eosinophils (3%), and myeloblasts (occ-1%). ELTS risk score 1.5596 (low risk) based on 0% blasts and 1.6648 (intermediate-risk) based on 1% blasts.  11/28/2020, started on Gleevec 400 mg daily. Patient reports feeling tired.  She was accompanied by her husband to establish care with me today. Denies any nausea vomiting diarrhea.  Denies any fever, chills.  Past Medical History:  Diagnosis Date  . Allergy   . Anxiety   . Arthritis 2010  . Chronic myeloid leukemia, BCR/ABL-positive, not having achieved remission ([place]) 11/17/2020  . Depression   . Diabetes mellitus without complication ([place])   . GERD (gastroesophageal reflux disease)   . Hyperlipidemia   . Hypertension     Past Surgical History:  Procedure Laterality Date  . CYST EXCISION Left 2017   foot  . HAND SURGERY  07/2012    Family History  Problem Relation Age of Onset  . Hypertension Mother   . Hyperlipidemia Mother   . Arthritis Mother   . Parkinsonism Father   . Arthritis Other   . Hypertension Other   . Hyperlipidemia Other   . Hypothyroidism Other   . Breast cancer Sister 77  . Cancer Sister     Social History:  reports that she has never smoked. She has never used smokeless tobacco. She reports that she does not drink alcohol and does not use drugs. She denies tobacco and alcohol use. She denies any exposure to radiation or toxins. She retired 20 years ago. 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 Her daughter and granddaughter live in [place]. She is originally from [place] and her sister and brother still live there. Her parents are from [place]. Her husband is [employer]. The patient is accompanied by [facility] today.  Allergies:  Allergies  Allergen  Reactions  . Clonazepam Other (See Comments)    Nocturnal enuresis; Lethargy   . Trazodone Anxiety and Other (See Comments)    Dizziness     Current Medications: Current Outpatient Medications  Medication Sig Dispense Refill  . acyclovir (ZOVIRAX) 200 MG capsule  Take 1 capsule (200 mg total) by mouth 5 (five) times daily. 90 capsule 1  . atenolol (TENORMIN) 50 MG tablet Take 1 tablet (50 mg total) by mouth daily. 90 tablet 1  . atorvastatin (LIPITOR) 10 MG tablet Take 1 tablet (10 mg total) by mouth at bedtime. 90 tablet 1  . Bilberry 100 MG CAPS Take by mouth.    . celecoxib (CELEBREX) 200 MG capsule TAKE 1 CAPSULE BY MOUTH EVERY DAY 90 capsule 3  . Coenzyme Q10 (CO Q 10) 100 MG CAPS Take 2 capsules by mouth daily.     [name] estazolam (PROSOM) 2 MG tablet TAKE 1 TABLET (2 MG TOTAL) BY MOUTH AT BEDTIME. 30 tablet 5  . glucose blood (FREESTYLE LITE) test strip Use to test BS twice daily 100 each 12  . hydrochlorothiazide (HYDRODIURIL) 12.5 MG tablet Take 1 tablet (12.5 mg total) by mouth daily. 90 tablet 3  . Lancets (FREESTYLE) lancets Use to test BS once daily 100 each 12  . Lutein 20 MG TABS Take 1 tablet by mouth daily.     . metFORMIN (GLUCOPHAGE) 1000 MG tablet Take 1 tablet (1,000 mg total) by mouth 2 (two) times daily with a meal. 180 tablet 1  . metroNIDAZOLE (METROGEL VAGINAL) 0.75 % vaginal gel Place 1 Applicatorful vaginally 2 (two) times daily. For 5 days for recurrence of vaginitis 70 g 1  . MULTIPLE VITAMIN PO 1 tablet daily.     [name] olopatadine (PATANOL) 0.1 % ophthalmic solution Apply to eye.    . Omega-3 Fatty Acids (FISH OIL PEARLS PO) Take by mouth.    [name] omeprazole (PRILOSEC) 20 MG capsule Take 1 capsule (20 mg total) by mouth daily. 90 capsule 1  . PARoxetine (PAXIL) 20 MG tablet Take 1 tablet (20 mg total) by mouth daily. 90 tablet 1  . sitaGLIPtin (JANUVIA) 100 MG tablet Take 1 tablet (100 mg total) by mouth daily. 90 tablet 1  . triamcinolone cream (KENALOG) 0.5 % APPLY 1 APPLICATION TOPICALLY 3 (THREE) TIMES DAILY. TO RASH ON ABDOMEN 30 g 0  . TURMERIC PO Take 800 mg by mouth.    . valsartan (DIOVAN) 160 MG tablet Take 1 tablet (160 mg total) by mouth daily. 90 tablet 1  . Vitamin D, Ergocalciferol, (DRISDOL) 1.25 MG (50000 UNIT) CAPS  capsule Take 50,000 Units by mouth every 7 (seven) days.    . vitamin E (VITAMIN E) 400 UNIT capsule Take 1 capsule (400 Units total) by mouth daily. 1 capsule 0  . imatinib (GLEEVEC) 400 MG tablet Take 1 tablet (400 mg total) by mouth daily. Take with meals and large glass of water.Caution:Chemotherapy. 30 tablet 1   No current facility-administered medications for this visit.    Review of Systems  Constitutional: Positive for malaise/fatigue. Negative for chills, fever and weight loss.  HENT: Negative for sore throat.   Eyes: Negative for redness.  Respiratory: Negative for cough, shortness of breath and wheezing.   Cardiovascular: Negative for chest pain, palpitations and leg swelling.  Gastrointestinal: Negative for abdominal pain, blood in stool, nausea and vomiting.  Genitourinary: Negative for dysuria.  Musculoskeletal: Positive for joint pain.  Skin: Negative for rash.  Neurological: Negative for dizziness, tingling and  tremors.  Endo/Heme/Allergies: Does not bruise/bleed easily.  Psychiatric/Behavioral: Negative for hallucinations.   Performance status (ECOG): 0 Vitals Blood pressure (!) 154/82, pulse 71, temperature (!) 97.4 F (36.3 C), resp. rate 18, weight 142 lb 1.4 oz (64.4 kg).  Physical Exam Constitutional:      General: She is not in acute distress.    Appearance: She is not diaphoretic.  HENT:     Head: Normocephalic and atraumatic.     Nose: Nose normal.     Mouth/Throat:     Pharynx: No oropharyngeal exudate.  Eyes:     General: No scleral icterus.    Pupils: Pupils are equal, round, and reactive to light.  Cardiovascular:     Rate and Rhythm: Normal rate and regular rhythm.     Heart sounds: No murmur heard.   Pulmonary:     Effort: Pulmonary effort is normal. No respiratory distress.     Breath sounds: No rales.  Chest:     Chest wall: No tenderness.  Abdominal:     General: There is no distension.     Palpations: Abdomen is soft.      Tenderness: There is no abdominal tenderness.  Musculoskeletal:        General: Normal range of motion.     Cervical back: Normal range of motion and neck supple.  Skin:    General: Skin is warm and dry.     Findings: No erythema.  Neurological:     Mental Status: She is alert and oriented to person, place, and time.     Cranial Nerves: No cranial nerve deficit.     Motor: No abnormal muscle tone.     Coordination: Coordination normal.  Psychiatric:        Mood and Affect: Affect normal.         Assessment and plan.  1. Chronic myeloid leukemia, BCR/ABL-positive, not having achieved remission ([place])   2. Other specified disorders of thyroid   3. Chemotherapy-induced neutropenia (HCC)   4. Encounter for antineoplastic chemotherapy   5. Normocytic anemia   6. Hyponatremia   7. Transaminitis     # Chronic myelogenous leukemia-low risk disease [Sokal, has 4, ELTS] Labs reviewed and discussed with patient and husband. Clinically she tolerates well. Second opinion from Dr. Katina Dung at [facility] reviewed. Continue Gleevec 400 mg daily.  #Leukopenia predominantly neutropenia.  Likely marrow suppression.  ANC 1.   Repeat CBC today showed a stable ANC at 1.1.  Continue Gleevec 400 mg daily.  #Anemia, hemoglobin 11.2, stable. Check CBC weekly x2 #Hyponatremia, encourage oral hydration.  Close monitor. #Transaminitis, grade 1, continue close monitor.  Follow-up with me in 4 weeks. Earlie Server, MD, PhD Hematology Oncology [place] [facility] 01/07/2021

## 2021-01-10 ENCOUNTER — Inpatient Hospital Stay: Payer: Medicare Other

## 2021-01-10 ENCOUNTER — Other Ambulatory Visit: Payer: Self-pay

## 2021-01-10 DIAGNOSIS — E871 Hypo-osmolality and hyponatremia: Secondary | ICD-10-CM | POA: Diagnosis not present

## 2021-01-10 DIAGNOSIS — D649 Anemia, unspecified: Secondary | ICD-10-CM | POA: Diagnosis not present

## 2021-01-10 DIAGNOSIS — C9210 Chronic myeloid leukemia, BCR/ABL-positive, not having achieved remission: Secondary | ICD-10-CM | POA: Diagnosis not present

## 2021-01-10 LAB — BASIC METABOLIC PANEL
Anion gap: 7 (ref 5–15)
BUN: 10 mg/dL (ref 8–23)
CO2: 25 mmol/L (ref 22–32)
Calcium: 8.9 mg/dL (ref 8.9–10.3)
Chloride: 98 mmol/L (ref 98–111)
Creatinine, Ser: 0.97 mg/dL (ref 0.44–1.00)
GFR, Estimated: >60 mL/min (ref >60)
Glucose, Bld: 138 mg/dL — ABNORMAL HIGH (ref 70–99)
Potassium: 3.3 mmol/L — ABNORMAL LOW (ref 3.5–5.1)
Sodium: 130 mmol/L — ABNORMAL LOW (ref 135–145)

## 2021-01-10 LAB — CBC WITH DIFFERENTIAL/PLATELET
Abs Immature Granulocytes: 0 10*3/uL (ref 0.00–0.07)
Basophils Absolute: 0 10*3/uL (ref 0.0–0.1)
Basophils Relative: 1 %
Eosinophils Absolute: 0.2 10*3/uL (ref 0.0–0.5)
Eosinophils Relative: 6 %
HCT: 33.9 % — ABNORMAL LOW (ref 36.0–46.0)
Hemoglobin: 11.5 g/dL — ABNORMAL LOW (ref 12.0–15.0)
Immature Granulocytes: 0 %
Lymphocytes Relative: 50 %
Lymphs Abs: 1.5 10*3/uL (ref 0.7–4.0)
MCH: 30 pg (ref 26.0–34.0)
MCHC: 33.9 g/dL (ref 30.0–36.0)
MCV: 88.5 fL (ref 80.0–100.0)
Monocytes Absolute: 0.2 10*3/uL (ref 0.1–1.0)
Monocytes Relative: 6 %
Neutro Abs: 1.1 10*3/uL — ABNORMAL LOW (ref 1.7–7.7)
Neutrophils Relative %: 37 %
Platelets: 163 10*3/uL (ref 150–400)
RBC: 3.83 MIL/uL — ABNORMAL LOW (ref 3.87–5.11)
RDW: 13.9 % (ref 11.5–15.5)
WBC: 3 10*3/uL — ABNORMAL LOW (ref 4.0–10.5)
nRBC: 0 % (ref 0.0–0.2)

## 2021-01-10 LAB — LIPASE, BLOOD: Lipase: 62 U/L — ABNORMAL HIGH (ref 11–51)

## 2021-01-17 ENCOUNTER — Inpatient Hospital Stay: Payer: Medicare Other | Attending: Oncology

## 2021-01-17 ENCOUNTER — Other Ambulatory Visit: Payer: Self-pay

## 2021-01-17 DIAGNOSIS — Z79899 Other long term (current) drug therapy: Secondary | ICD-10-CM | POA: Diagnosis not present

## 2021-01-17 DIAGNOSIS — D649 Anemia, unspecified: Secondary | ICD-10-CM | POA: Diagnosis not present

## 2021-01-17 DIAGNOSIS — R748 Abnormal levels of other serum enzymes: Secondary | ICD-10-CM | POA: Insufficient documentation

## 2021-01-17 DIAGNOSIS — C9210 Chronic myeloid leukemia, BCR/ABL-positive, not having achieved remission: Secondary | ICD-10-CM | POA: Insufficient documentation

## 2021-01-17 DIAGNOSIS — Z803 Family history of malignant neoplasm of breast: Secondary | ICD-10-CM | POA: Insufficient documentation

## 2021-01-17 DIAGNOSIS — D701 Agranulocytosis secondary to cancer chemotherapy: Secondary | ICD-10-CM | POA: Diagnosis not present

## 2021-01-17 DIAGNOSIS — T451X5A Adverse effect of antineoplastic and immunosuppressive drugs, initial encounter: Secondary | ICD-10-CM | POA: Insufficient documentation

## 2021-01-17 LAB — CBC WITH DIFFERENTIAL/PLATELET
Abs Immature Granulocytes: 0.01 10*3/uL (ref 0.00–0.07)
Basophils Absolute: 0 10*3/uL (ref 0.0–0.1)
Basophils Relative: 1 %
Eosinophils Absolute: 0.2 10*3/uL (ref 0.0–0.5)
Eosinophils Relative: 5 %
HCT: 31.8 % — ABNORMAL LOW (ref 36.0–46.0)
Hemoglobin: 10.6 g/dL — ABNORMAL LOW (ref 12.0–15.0)
Immature Granulocytes: 0 %
Lymphocytes Relative: 42 %
Lymphs Abs: 1.4 10*3/uL (ref 0.7–4.0)
MCH: 29.8 pg (ref 26.0–34.0)
MCHC: 33.3 g/dL (ref 30.0–36.0)
MCV: 89.3 fL (ref 80.0–100.0)
Monocytes Absolute: 0.3 10*3/uL (ref 0.1–1.0)
Monocytes Relative: 10 %
Neutro Abs: 1.4 10*3/uL — ABNORMAL LOW (ref 1.7–7.7)
Neutrophils Relative %: 42 %
Platelets: 181 10*3/uL (ref 150–400)
RBC: 3.56 MIL/uL — ABNORMAL LOW (ref 3.87–5.11)
RDW: 14.3 % (ref 11.5–15.5)
WBC: 3.3 10*3/uL — ABNORMAL LOW (ref 4.0–10.5)
nRBC: 0 % (ref 0.0–0.2)

## 2021-01-25 ENCOUNTER — Ambulatory Visit (INDEPENDENT_AMBULATORY_CARE_PROVIDER_SITE_OTHER): Payer: Medicare Other | Admitting: Internal Medicine

## 2021-01-25 ENCOUNTER — Ambulatory Visit: Payer: Medicare Other | Admitting: Internal Medicine

## 2021-01-25 ENCOUNTER — Encounter: Payer: Self-pay | Admitting: Internal Medicine

## 2021-01-25 ENCOUNTER — Other Ambulatory Visit: Payer: Self-pay

## 2021-01-25 VITALS — BP 138/78 | HR 64 | Temp 98.2°F | Ht 64.0 in | Wt 140.0 lb

## 2021-01-25 DIAGNOSIS — I10 Essential (primary) hypertension: Secondary | ICD-10-CM | POA: Diagnosis not present

## 2021-01-25 DIAGNOSIS — E1169 Type 2 diabetes mellitus with other specified complication: Secondary | ICD-10-CM

## 2021-01-25 DIAGNOSIS — A6004 Herpesviral vulvovaginitis: Secondary | ICD-10-CM

## 2021-01-25 DIAGNOSIS — C9210 Chronic myeloid leukemia, BCR/ABL-positive, not having achieved remission: Secondary | ICD-10-CM | POA: Diagnosis not present

## 2021-01-25 DIAGNOSIS — E785 Hyperlipidemia, unspecified: Secondary | ICD-10-CM | POA: Diagnosis not present

## 2021-01-25 DIAGNOSIS — E118 Type 2 diabetes mellitus with unspecified complications: Secondary | ICD-10-CM

## 2021-01-25 LAB — POCT GLYCOSYLATED HEMOGLOBIN (HGB A1C): Hemoglobin A1C: 5.9 % — AB (ref 4.0–5.6)

## 2021-01-25 MED ORDER — VALSARTAN 320 MG PO TABS: 320.0000 mg | ORAL_TABLET | Freq: Every day | ORAL | 1 refills | Status: DC

## 2021-01-25 MED ORDER — ATORVASTATIN CALCIUM 10 MG PO TABS: 10.0000 mg | ORAL_TABLET | Freq: Every day | ORAL | 1 refills | Status: DC

## 2021-01-25 MED ORDER — FREESTYLE LITE TEST VI STRP: ORAL_STRIP | 12 refills | Status: DC

## 2021-01-25 MED ORDER — SITAGLIPTIN PHOSPHATE 100 MG PO TABS: 100.0000 mg | ORAL_TABLET | Freq: Every day | ORAL | 1 refills | Status: DC

## 2021-01-25 NOTE — Patient Instructions (Signed)
Acyclovir 5 times a day for 5 days

## 2021-01-25 NOTE — Progress Notes (Signed)
Date:  01/25/2021   Name:  Jenna Stein   DOB:  05/02/1951   MRN:  [id number]   Chief Complaint: Diabetes (Last BS 120 this morning /) and Hypertension  Diabetes She presents for her follow-up diabetic visit. She has type 2 diabetes mellitus. Her disease course has been stable. Pertinent negatives for hypoglycemia include no headaches, nervousness/anxiousness or tremors. Associated symptoms include fatigue. Pertinent negatives for diabetes include no chest pain, no polydipsia and no polyuria. Current diabetic treatment includes oral agent (dual therapy) Celesta Gentile, metformin). An ACE inhibitor/angiotensin II receptor blocker is being taken.  Hypertension This is a chronic problem. The problem is controlled (higher readings since starting Gleevec - in the 150/80 range). Pertinent negatives include no chest pain, headaches, palpitations or shortness of breath. Past treatments include diuretics, beta blockers and angiotensin blockers. The current treatment provides significant improvement.  CML - diagnosed recently.  Tolerating Gleevec well - so side diarrhea and fatigue but counts are increasing.  Lab Results  Component Value Date   CREATININE 0.97 01/10/2021   BUN 10 01/10/2021   NA 130 (L) 01/10/2021   K 3.3 (L) 01/10/2021   CL 98 01/10/2021   CO2 25 01/10/2021   Lab Results  Component Value Date   CHOL 165 10/09/2020   HDL 47 10/09/2020   LDLCALC 94 10/09/2020   TRIG 133 10/09/2020   CHOLHDL 3.5 10/09/2020   Lab Results  Component Value Date   TSH 1.011 01/03/2021   Lab Results  Component Value Date   HGBA1C 6.0 (H) 10/09/2020   Lab Results  Component Value Date   WBC 3.3 (L) 01/17/2021   HGB 10.6 (L) 01/17/2021   HCT 31.8 (L) 01/17/2021   MCV 89.3 01/17/2021   PLT 181 01/17/2021   Lab Results  Component Value Date   ALT 61 (H) 01/03/2021   AST 47 (H) 01/03/2021   ALKPHOS 66 01/03/2021   BILITOT 1.0 01/03/2021     Review of Systems  Constitutional:   Positive for fatigue. Negative for appetite change, fever and unexpected weight change.  HENT:  Negative for tinnitus and trouble swallowing.   Eyes:  Negative for visual disturbance.  Respiratory:  Negative for cough, chest tightness and shortness of breath.   Cardiovascular:  Negative for chest pain, palpitations and leg swelling.  Gastrointestinal:  Positive for diarrhea (intermittent). Negative for abdominal pain.  Endocrine: Negative for polydipsia and polyuria.  Genitourinary:  Negative for dysuria and hematuria.  Musculoskeletal:  Negative for arthralgias.  Skin:  Positive for rash (painful itching bumps on labia).  Neurological:  Negative for tremors, numbness and headaches.  Psychiatric/Behavioral:  Negative for dysphoric mood and sleep disturbance. The patient is not nervous/anxious.    Patient Active Problem List   Diagnosis Date Noted   Chronic myeloid leukemia, BCR/ABL-positive, not having achieved remission ([place]) 11/17/2020   Abnormal blood smear 10/25/2020   Gastroesophageal reflux disease 08/13/2018   Slow transit constipation 08/13/2018   Type II diabetes mellitus with complication ([place]) 15/40/0867   Dry mouth 10/10/2016   Ovarian failure 10/10/2016   Allergic rhinitis, seasonal 04/30/2015   Hepatic steatosis 04/30/2015   Osteoarthritis of both hands 04/30/2015   Genital herpes 04/04/2009   Depression, major, single episode, in partial remission ([place]) 08/18/1998   Essential hypertension 08/18/1998   Hyperlipidemia associated with type 2 diabetes mellitus ([place]) 08/18/1998   Primary insomnia 08/18/1998    Allergies  Allergen Reactions   Clonazepam Other (See Comments)    Nocturnal enuresis;  Lethargy    Trazodone Anxiety and Other (See Comments)    Dizziness     Past Surgical History:  Procedure Laterality Date   CYST EXCISION Left 2017   foot   HAND SURGERY  07/2012    Social History   Tobacco Use   Smoking status: Never   Smokeless tobacco: Never    Tobacco comments:    smoking cessation materials not required  Vaping Use   Vaping Use: Never used  Substance Use Topics   Alcohol use: No   Drug use: No     Medication list has been reviewed and updated.  Current Meds  Medication Sig   acyclovir (ZOVIRAX) 200 MG capsule Take 1 capsule (200 mg total) by mouth 5 (five) times daily.   atenolol (TENORMIN) 50 MG tablet Take 1 tablet (50 mg total) by mouth daily.   atorvastatin (LIPITOR) 10 MG tablet Take 1 tablet (10 mg total) by mouth at bedtime.   celecoxib (CELEBREX) 200 MG capsule TAKE 1 CAPSULE BY MOUTH EVERY DAY   Coenzyme Q10 (CO Q 10) 100 MG CAPS Take 2 capsules by mouth daily.    estazolam (PROSOM) 2 MG tablet TAKE 1 TABLET (2 MG TOTAL) BY MOUTH AT BEDTIME.   glucose blood (FREESTYLE LITE) test strip Use to test BS twice daily   hydrochlorothiazide (HYDRODIURIL) 12.5 MG tablet Take 1 tablet (12.5 mg total) by mouth daily.   imatinib (GLEEVEC) 400 MG tablet Take 1 tablet (400 mg total) by mouth daily. Take with meals and large glass of water.Caution:Chemotherapy.   Lancets (FREESTYLE) lancets Use to test BS once daily   Lutein 20 MG TABS Take 1 tablet by mouth daily.    metFORMIN (GLUCOPHAGE) 1000 MG tablet Take 1 tablet (1,000 mg total) by mouth 2 (two) times daily with a meal.   metroNIDAZOLE (METROGEL VAGINAL) 0.75 % vaginal gel Place 1 Applicatorful vaginally 2 (two) times daily. For 5 days for recurrence of vaginitis   MULTIPLE VITAMIN PO 1 tablet daily.    olopatadine (PATANOL) 0.1 % ophthalmic solution Apply to eye.   Omega-3 Fatty Acids (FISH OIL PEARLS PO) Take by mouth.   omeprazole (PRILOSEC) 20 MG capsule Take 1 capsule (20 mg total) by mouth daily.   PARoxetine (PAXIL) 20 MG tablet Take 1 tablet (20 mg total) by mouth daily.   sitaGLIPtin (JANUVIA) 100 MG tablet Take 1 tablet (100 mg total) by mouth daily.   triamcinolone cream (KENALOG) 0.5 % APPLY 1 APPLICATION TOPICALLY 3 (THREE) TIMES DAILY. TO RASH ON ABDOMEN    TURMERIC PO Take 800 mg by mouth.   valsartan (DIOVAN) 160 MG tablet Take 1 tablet (160 mg total) by mouth daily.   Vitamin D, Ergocalciferol, (DRISDOL) 1.25 MG (50000 UNIT) CAPS capsule Take 50,000 Units by mouth every 7 (seven) days.   vitamin E (VITAMIN E) 400 UNIT capsule Take 1 capsule (400 Units total) by mouth daily.   [DISCONTINUED] Bilberry 100 MG CAPS Take by mouth.    PHQ 2/9 Scores 01/25/2021 10/09/2020 09/10/2020 06/04/2020  PHQ - 2 Score 2 0 0 0  PHQ- 9 Score 3 0 - 0    GAD 7 : Generalized Anxiety Score 01/25/2021 10/09/2020 06/04/2020 02/01/2020  Nervous, Anxious, on Edge 0 0 0 1  Control/stop worrying 0 0 0 2  Worry too much - different things 0 0 0 2  Trouble relaxing 0 0 0 0  Restless 0 0 0 0  Easily annoyed or irritable 0 0 0 0  Afraid - awful might happen 0 0 0 3  Total GAD 7 Score 0 0 0 8  Anxiety Difficulty - - Not difficult at all Not difficult at all    BP Readings from Last 3 Encounters:  01/25/21 138/78  01/03/21 (!) 154/82  11/19/20 135/74    Physical Exam Vitals and nursing note reviewed.  Constitutional:      General: She is not in acute distress.    Appearance: Normal appearance. She is well-developed.  HENT:     Head: Normocephalic and atraumatic.  Cardiovascular:     Rate and Rhythm: Normal rate and regular rhythm.     Pulses: Normal pulses.  Pulmonary:     Effort: Pulmonary effort is normal. No respiratory distress.     Breath sounds: No wheezing or rhonchi.  Musculoskeletal:     Cervical back: Normal range of motion.     Right lower leg: No edema.     Left lower leg: No edema.  Lymphadenopathy:     Cervical: No cervical adenopathy.  Skin:    General: Skin is warm and dry.     Findings: No rash.  Neurological:     General: No focal deficit present.     Mental Status: She is alert and oriented to person, place, and time.  Psychiatric:        Mood and Affect: Mood normal.        Behavior: Behavior normal.    Wt Readings from Last  3 Encounters:  01/25/21 140 lb (63.5 kg)  01/03/21 142 lb 1.4 oz (64.4 kg)  11/19/20 141 lb 6.8 oz (64.1 kg)    BP 138/78   Pulse 64   Temp 98.2 F (36.8 C) (Oral)   Ht 5\' 4"  (1.626 m)   Wt 140 lb (63.5 kg)   SpO2 99%   BMI 24.03 kg/m   Assessment and Plan: 1. Type II diabetes mellitus with complication (HCC) Clinically stable by exam and report without s/s of hypoglycemia. DM complicated by hypertension and dyslipidemia. Tolerating medications well without side effects or other concerns. - POCT HgB A1C = 5.9 - glucose blood (FREESTYLE LITE) test strip; Use to test BS twice daily  Dispense: 100 each; Refill: 12 - sitaGLIPtin (JANUVIA) 100 MG tablet; Take 1 tablet (100 mg total) by mouth daily.  Dispense: 90 tablet; Refill: 1  2. Essential hypertension Higher readings over the past month. Will continue medications but double Valsartan.   Monitor BP at home - call if not improved. - valsartan (DIOVAN) 320 MG tablet; Take 1 tablet (320 mg total) by mouth daily.  Dispense: 90 tablet; Refill: 1  3. Hyperlipidemia associated with type 2 diabetes mellitus (HCC) Continue statin therapy - atorvastatin (LIPITOR) 10 MG tablet; Take 1 tablet (10 mg total) by mouth at bedtime.  Dispense: 90 tablet; Refill: 1  4. Herpes simplex vulvovaginitis Suspect HSV as cause of labial lesions Take acyclovir 200 mg 5 times a day for 5 days Follow up if no improvement  5. Chronic myeloid leukemia, BCR/ABL-positive, not having achieved remission [facility]) Now undergoing treatment with Gleevec Follow up with oncology as instructed.   Partially dictated using Editor, commissioning. Any errors are unintentional.  Halina Maidens, MD [facility]  01/25/2021

## 2021-01-31 ENCOUNTER — Encounter: Payer: Self-pay | Admitting: Oncology

## 2021-01-31 ENCOUNTER — Other Ambulatory Visit: Payer: Self-pay

## 2021-01-31 ENCOUNTER — Inpatient Hospital Stay (HOSPITAL_BASED_OUTPATIENT_CLINIC_OR_DEPARTMENT_OTHER): Payer: Medicare Other | Admitting: Oncology

## 2021-01-31 ENCOUNTER — Inpatient Hospital Stay: Payer: Medicare Other

## 2021-01-31 VITALS — BP 144/80 | HR 73 | Temp 96.5°F | Resp 16 | Wt 141.1 lb

## 2021-01-31 DIAGNOSIS — T451X5A Adverse effect of antineoplastic and immunosuppressive drugs, initial encounter: Secondary | ICD-10-CM

## 2021-01-31 DIAGNOSIS — D649 Anemia, unspecified: Secondary | ICD-10-CM | POA: Diagnosis not present

## 2021-01-31 DIAGNOSIS — R748 Abnormal levels of other serum enzymes: Secondary | ICD-10-CM | POA: Diagnosis not present

## 2021-01-31 DIAGNOSIS — Z5111 Encounter for antineoplastic chemotherapy: Secondary | ICD-10-CM

## 2021-01-31 DIAGNOSIS — C9210 Chronic myeloid leukemia, BCR/ABL-positive, not having achieved remission: Secondary | ICD-10-CM

## 2021-01-31 DIAGNOSIS — D701 Agranulocytosis secondary to cancer chemotherapy: Secondary | ICD-10-CM

## 2021-01-31 DIAGNOSIS — Z79899 Other long term (current) drug therapy: Secondary | ICD-10-CM | POA: Diagnosis not present

## 2021-01-31 DIAGNOSIS — Z803 Family history of malignant neoplasm of breast: Secondary | ICD-10-CM | POA: Diagnosis not present

## 2021-01-31 LAB — COMPREHENSIVE METABOLIC PANEL
ALT: 23 U/L (ref 0–44)
AST: 29 U/L (ref 15–41)
Albumin: 4.3 g/dL (ref 3.5–5.0)
Alkaline Phosphatase: 54 U/L (ref 38–126)
Anion gap: 7 (ref 5–15)
BUN: 12 mg/dL (ref 8–23)
CO2: 28 mmol/L (ref 22–32)
Calcium: 9.2 mg/dL (ref 8.9–10.3)
Chloride: 100 mmol/L (ref 98–111)
Creatinine, Ser: 0.88 mg/dL (ref 0.44–1.00)
GFR, Estimated: >60 mL/min (ref >60)
Glucose, Bld: 111 mg/dL — ABNORMAL HIGH (ref 70–99)
Potassium: 3.8 mmol/L (ref 3.5–5.1)
Sodium: 135 mmol/L (ref 135–145)
Total Bilirubin: 0.8 mg/dL (ref 0.3–1.2)
Total Protein: 6.8 g/dL (ref 6.5–8.1)

## 2021-01-31 LAB — CBC WITH DIFFERENTIAL/PLATELET
Abs Immature Granulocytes: 0.01 10*3/uL (ref 0.00–0.07)
Basophils Absolute: 0 10*3/uL (ref 0.0–0.1)
Basophils Relative: 1 %
Eosinophils Absolute: 0.3 10*3/uL (ref 0.0–0.5)
Eosinophils Relative: 6 %
HCT: 32.7 % — ABNORMAL LOW (ref 36.0–46.0)
Hemoglobin: 11.4 g/dL — ABNORMAL LOW (ref 12.0–15.0)
Immature Granulocytes: 0 %
Lymphocytes Relative: 39 %
Lymphs Abs: 1.8 10*3/uL (ref 0.7–4.0)
MCH: 30.9 pg (ref 26.0–34.0)
MCHC: 34.9 g/dL (ref 30.0–36.0)
MCV: 88.6 fL (ref 80.0–100.0)
Monocytes Absolute: 0.3 10*3/uL (ref 0.1–1.0)
Monocytes Relative: 7 %
Neutro Abs: 2.1 10*3/uL (ref 1.7–7.7)
Neutrophils Relative %: 47 %
Platelets: 202 10*3/uL (ref 150–400)
RBC: 3.69 MIL/uL — ABNORMAL LOW (ref 3.87–5.11)
RDW: 14.6 % (ref 11.5–15.5)
WBC: 4.5 10*3/uL (ref 4.0–10.5)
nRBC: 0 % (ref 0.0–0.2)

## 2021-01-31 LAB — LIPASE, BLOOD: Lipase: 57 U/L — ABNORMAL HIGH (ref 11–51)

## 2021-01-31 NOTE — Progress Notes (Signed)
[facility]  [address] [place],  [address] Phone: [phone]  Fax: [phone]   Clinic Day:  01/31/2021  Referring physician: Glean Hess, MD  Chief Complaint: Jenna Stein is a 70 y.o. female with chronic phase CML who is seen for management of CML.  PERTINENT ONCOLOGY HISTORY Jenna Stein is a 70 y.o.afemale who has above oncology history reviewed by me today presented for follow up visit for management of CML Patient previously followed up by Dr.Corcoran, patient switched care to me on 01/31/21 Extensive medical record review was performed by me  10/09/2020 abnormal peripheral smear (3% myelocytes) 10/25/2020  lab work-up revealed a hematocrit of 40.8, hemoglobin 13.7, platelets 352,000, WBC 10,200. LDH was 174.  BCR-ABL showed 70% of nuclei positive for BCR/ABL1 fusion.  Flow cytometry revealed left shifted aberrant neutrophils, aberrant monocytes and absolute basophilia.   BCR-ABL1 revealed 123.6615% b2a2, < 0.0032% b3a2, and < 0.0032% E1A2 transcript. The ABL1 e13a2 (b2a2, p210) fusion transcript was positive.   11/05/2020 bone marrow biopsy showed  slightly hypercellular marrow with mild granulocytic proliferation and atypical megakaryocytes. The combined bone marrow and peripheral blood findings were very limited but were generally c/w early involvement by chronic myeloid leukemia, chronic phase especially in the presence of previously documented positivity for BCR/ABL1 fusion in peripheral blood analysis.  Cytogenetics revealed 78, XX, t(9;22)(q34.1; q11.2)[19] / 64, XX [1].  Molecular genetics showed a BCR-ABL1 translocation t(9;22) with 65.5684 % BCR-ABL1/ABL1 (IS), major breakpoint p210, log reduction 0.001.  11/16/2020 abdominal ultrasound showed no splenomegaly (7.2 x 9.5 x 4.4 cm).   Relative risk (RR) Sokal 0.81 (intermediate) and Hasford 684.83 (low) based on age (65), spleen (0), platelets (309), basophils (6%),  eosinophils (3%), and myeloblasts (occ-1%). ELTS risk score 1.5596 (low risk) based on 0% blasts and 1.6648 (intermediate-risk) based on 1% blasts.  11/28/2020, started on Gleevec 400 mg daily.  INTERVAL HISTORY Jenna Stein is a 70 y.o. female who has above history reviewed by me today presents for follow up visit for management of CML Problems and complaints are listed below:  Patient has been on Gleevec 400 mg daily.  She reports feeling okay.  Tolerating treatment so far.  No new complaints.  Past Medical History:  Diagnosis Date   Allergy    Anxiety    Arthritis 2010   Chronic myeloid leukemia, BCR/ABL-positive, not having achieved remission ([place]) 11/17/2020   Depression    Diabetes mellitus without complication (HCC)    GERD (gastroesophageal reflux disease)    Hyperlipidemia    Hypertension     Past Surgical History:  Procedure Laterality Date   CYST EXCISION Left 2017   foot   HAND SURGERY  07/2012    Family History  Problem Relation Age of Onset   Hypertension Mother    Hyperlipidemia Mother    Arthritis Mother    Parkinsonism Father    Arthritis Other    Hypertension Other    Hyperlipidemia Other    Hypothyroidism Other    Breast cancer Sister 23   Cancer Sister     Social History:  reports that she has never smoked. She has never used smokeless tobacco. She reports that she does not drink alcohol and does not use drugs. She denies tobacco and alcohol use. She denies any exposure to radiation or toxins. She retired 20 years ago. 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 Her daughter and granddaughter live in [place]. She is originally from [place]. Jenna Stein  Her husband is [employer] fast streamer. The patient is accompanied by [facility] today.  Allergies:  Allergies  Allergen Reactions   Clonazepam Other (See Comments)    Nocturnal enuresis; Lethargy    Trazodone Anxiety and Other (See Comments)    Dizziness     Current Medications: Current Outpatient Medications  Medication Sig  Dispense Refill   acyclovir (ZOVIRAX) 200 MG capsule Take 1 capsule (200 mg total) by mouth 5 (five) times daily. 90 capsule 1   atenolol (TENORMIN) 50 MG tablet Take 1 tablet (50 mg total) by mouth daily. 90 tablet 1   atorvastatin (LIPITOR) 10 MG tablet Take 1 tablet (10 mg total) by mouth at bedtime. 90 tablet 1   celecoxib (CELEBREX) 200 MG capsule TAKE 1 CAPSULE BY MOUTH EVERY DAY 90 capsule 3   Coenzyme Q10 (CO Q 10) 100 MG CAPS Take 2 capsules by mouth daily.      estazolam (PROSOM) 2 MG tablet TAKE 1 TABLET (2 MG TOTAL) BY MOUTH AT BEDTIME. 30 tablet 5   glucose blood (FREESTYLE LITE) test strip Use to test BS twice daily 100 each 12   hydrochlorothiazide (HYDRODIURIL) 12.5 MG tablet Take 1 tablet (12.5 mg total) by mouth daily. 90 tablet 3   imatinib (GLEEVEC) 400 MG tablet Take 1 tablet (400 mg total) by mouth daily. Take with meals and large glass of water.Caution:Chemotherapy. 30 tablet 1   Lancets (FREESTYLE) lancets Use to test BS once daily 100 each 12   Lutein 20 MG TABS Take 1 tablet by mouth daily.      metFORMIN (GLUCOPHAGE) 1000 MG tablet Take 1 tablet (1,000 mg total) by mouth 2 (two) times daily with a meal. 180 tablet 1   metroNIDAZOLE (METROGEL VAGINAL) 0.75 % vaginal gel Place 1 Applicatorful vaginally 2 (two) times daily. For 5 days for recurrence of vaginitis 70 g 1   MULTIPLE VITAMIN PO 1 tablet daily.      olopatadine (PATANOL) 0.1 % ophthalmic solution Apply to eye.     Omega-3 Fatty Acids (FISH OIL PEARLS PO) Take by mouth.     omeprazole (PRILOSEC) 20 MG capsule Take 1 capsule (20 mg total) by mouth daily. 90 capsule 1   PARoxetine (PAXIL) 20 MG tablet Take 1 tablet (20 mg total) by mouth daily. 90 tablet 1   PATADAY 0.2 % SOLN Instill 1 drop into both eyes once a day     sitaGLIPtin (JANUVIA) 100 MG tablet Take 1 tablet (100 mg total) by mouth daily. 90 tablet 1   triamcinolone cream (KENALOG) 0.5 % APPLY 1 APPLICATION TOPICALLY 3 (THREE) TIMES DAILY. TO RASH ON  ABDOMEN 30 g 0   TURMERIC PO Take 800 mg by mouth.     valsartan (DIOVAN) 320 MG tablet Take 1 tablet (320 mg total) by mouth daily. 90 tablet 1   Vitamin D, Ergocalciferol, (DRISDOL) 1.25 MG (50000 UNIT) CAPS capsule Take 50,000 Units by mouth every 7 (seven) days.     vitamin E (VITAMIN E) 400 UNIT capsule Take 1 capsule (400 Units total) by mouth daily. 1 capsule 0   No current facility-administered medications for this visit.    Review of Systems  Constitutional:  Positive for malaise/fatigue. Negative for chills, fever and weight loss.  HENT:  Negative for sore throat.   Eyes:  Negative for redness.  Respiratory:  Negative for cough, shortness of breath and wheezing.   Cardiovascular:  Negative for chest pain, palpitations and leg swelling.  Gastrointestinal:  Negative for abdominal pain,  blood in stool, nausea and vomiting.  Genitourinary:  Negative for dysuria.  Musculoskeletal:  Positive for joint pain.  Skin:  Negative for rash.  Neurological:  Negative for dizziness, tingling and tremors.  Endo/Heme/Allergies:  Does not bruise/bleed easily.  Psychiatric/Behavioral:  Negative for hallucinations.   Performance status (ECOG): 0 Vitals Blood pressure (!) 144/80, pulse 73, temperature (!) 96.5 F (35.8 C), resp. rate 16, weight 141 lb 1.5 oz (64 kg), SpO2 100 %.  Physical Exam Constitutional:      General: She is not in acute distress.    Appearance: She is not diaphoretic.  HENT:     Head: Normocephalic and atraumatic.     Nose: Nose normal.     Mouth/Throat:     Pharynx: No oropharyngeal exudate.  Eyes:     General: No scleral icterus.    Pupils: Pupils are equal, round, and reactive to light.  Cardiovascular:     Rate and Rhythm: Normal rate and regular rhythm.     Heart sounds: No murmur heard. Pulmonary:     Effort: Pulmonary effort is normal. No respiratory distress.     Breath sounds: No rales.  Chest:     Chest wall: No tenderness.  Abdominal:     General:  There is no distension.     Palpations: Abdomen is soft.     Tenderness: There is no abdominal tenderness.  Musculoskeletal:        General: Normal range of motion.     Cervical back: Normal range of motion and neck supple.  Skin:    General: Skin is warm and dry.     Findings: No erythema.  Neurological:     Mental Status: She is alert and oriented to person, place, and time.     Cranial Nerves: No cranial nerve deficit.     Motor: No abnormal muscle tone.     Coordination: Coordination normal.  Psychiatric:        Mood and Affect: Affect normal.        Assessment and plan.  1. Chronic myeloid leukemia, BCR/ABL-positive, not having achieved remission ([place])   2. Chemotherapy-induced neutropenia (HCC)   3. Encounter for antineoplastic chemotherapy   4. Normocytic anemia     # Chronic myelogenous leukemia-low risk disease [Sokal, has 4, ELTS] Labs reviewed and discussed with patient Clinically she tolerates treatment very well. Will obtain BCR ABL1 FISH qualification in 4 weeks-will be her 3 months milestone Continue Gleevec 400 mg daily.  #Leukopenia predominantly neutropenia.  Likely marrow suppression.  ANC normalized.Jenna Stein    #Anemia, hemoglobin 11.4, stable.  #Increased lipase, mild.  Due to [place].  She is asymptomatic.  Monitor.   Follow-up with me in 4 weeks. Earlie Server, MD, PhD Hematology Oncology [place] at [facility] 01/31/2021

## 2021-02-19 IMAGING — MG DIGITAL SCREENING BILAT W/ TOMO W/ CAD
6 of 10 series · 6 of 30 positions shown · non-contrast
Comparison: Previous exam(s).

CLINICAL DATA: Screening.

EXAM:
DIGITAL SCREENING BILATERAL MAMMOGRAM WITH TOMO AND CAD

[R MLO synth-2D]
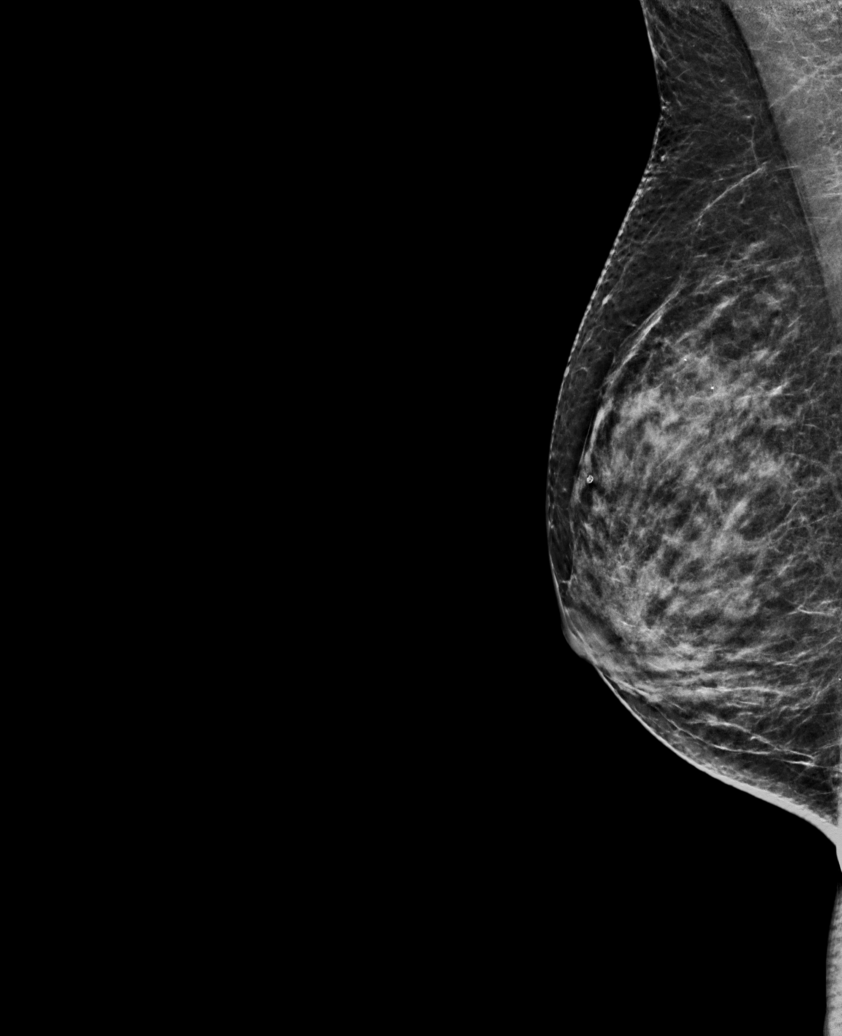

[L MLO synth-2D]
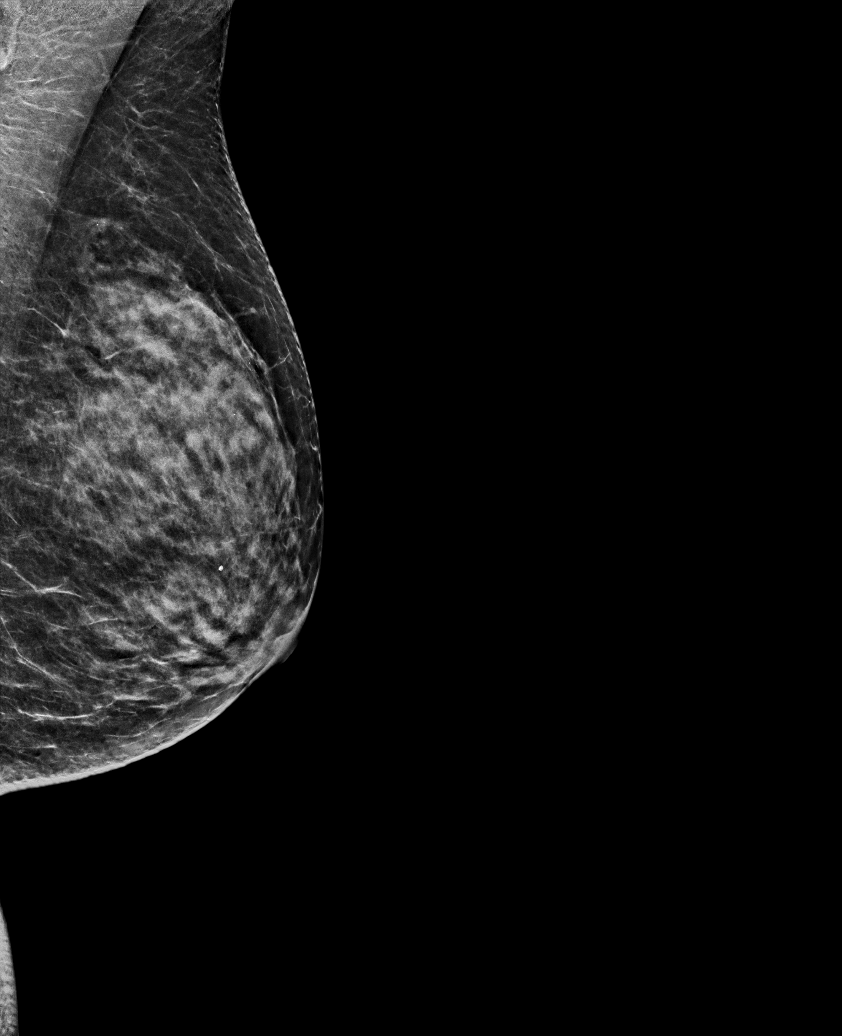

[R CC synth-2D]
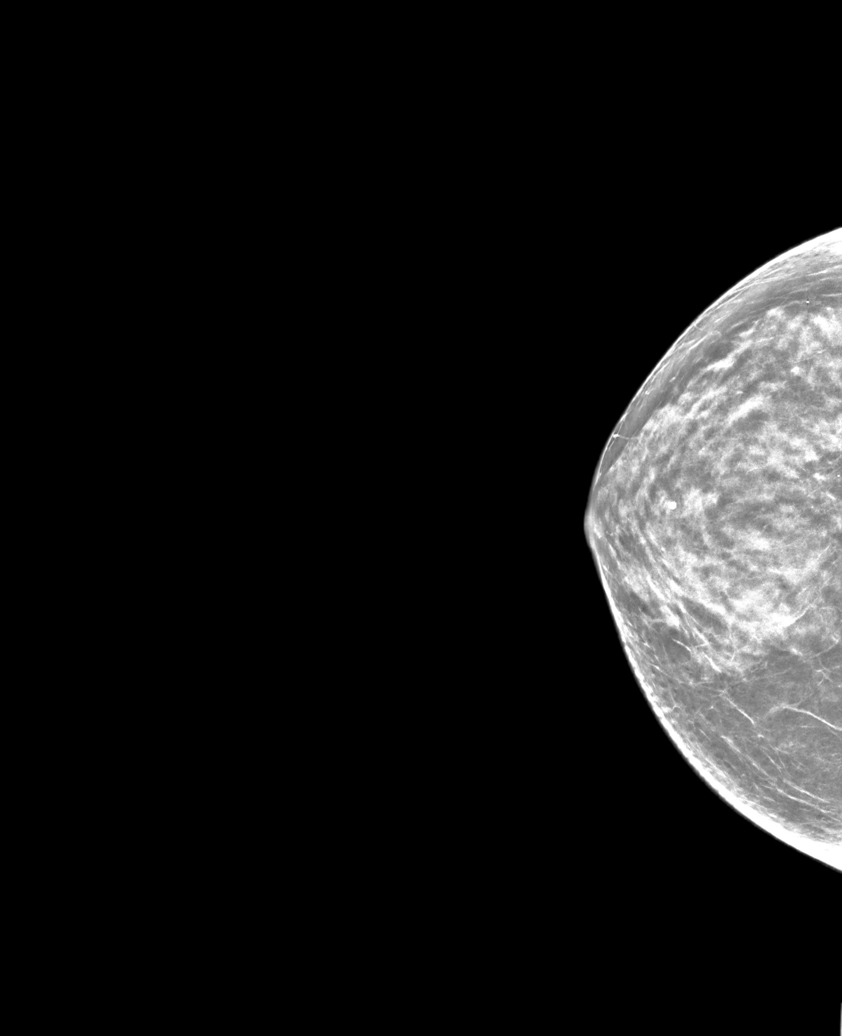

[L CC synth-2D (1 of 2)]
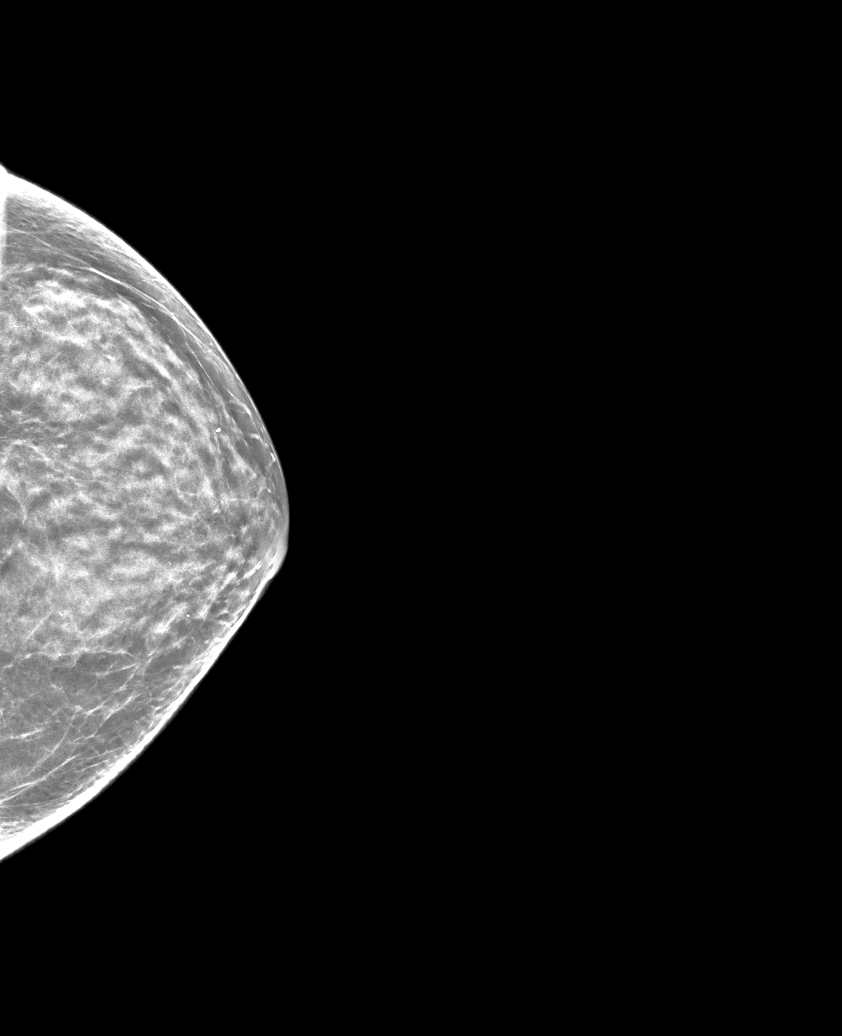

[L CC synth-2D (2 of 2)]
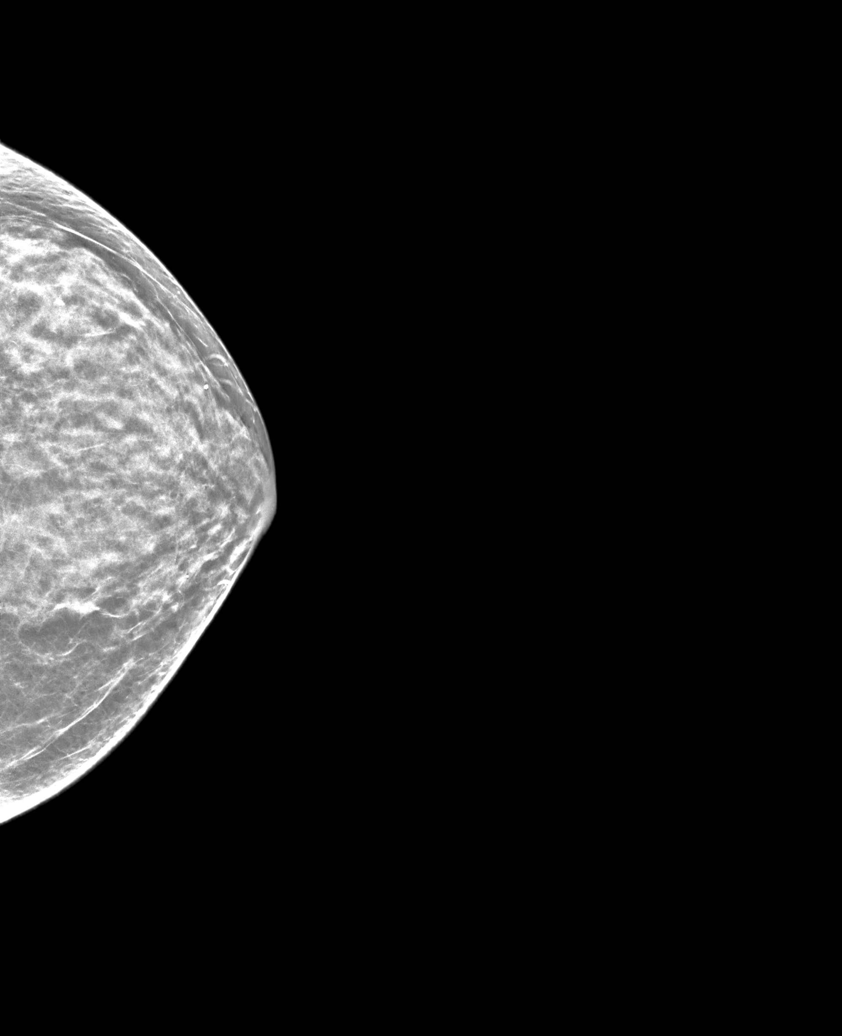

[L CC tomo · tomo slice 33/65.0]
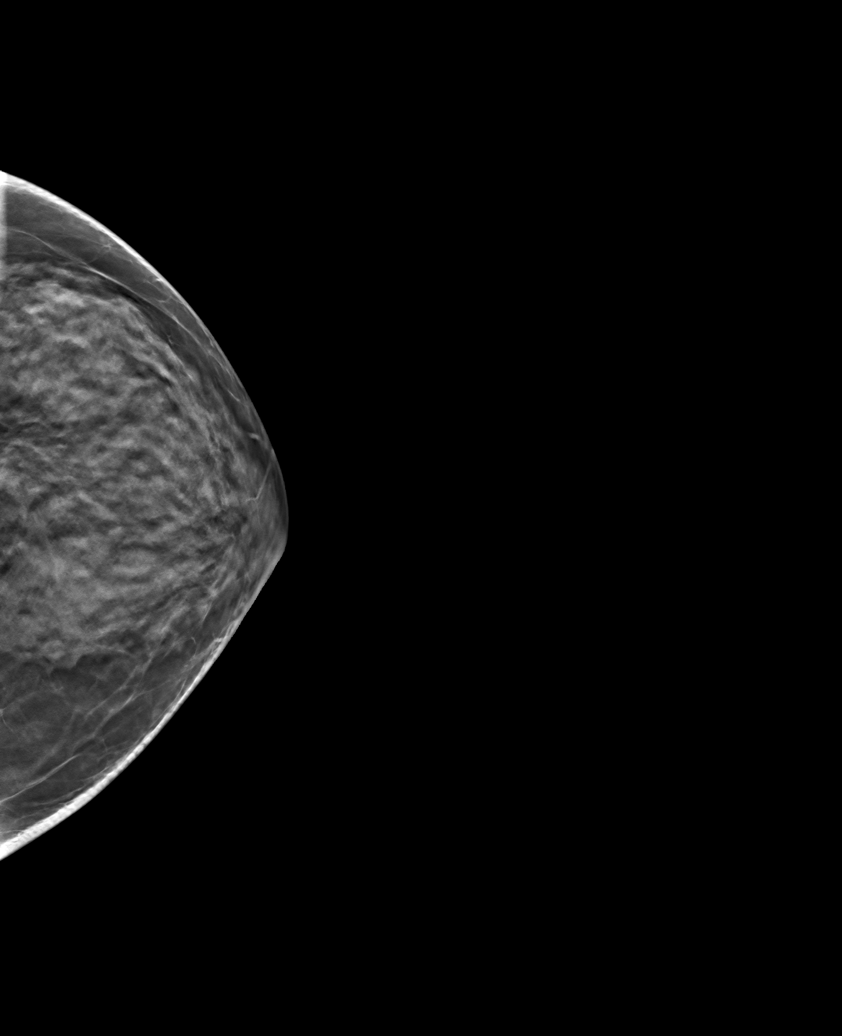

[6 of 30 positions shown; findings below may reference images not displayed]

ACR Breast Density Category c: The breast tissue is heterogeneously
dense, which may obscure small masses.
FINDINGS: There are no findings suspicious for malignancy. Images were
processed with CAD.
IMPRESSION: No mammographic evidence of malignancy. A result letter of this
screening mammogram will be mailed directly to the patient.

RECOMMENDATION:
Screening mammogram in one year. (Code:[id number]-U-LHB)

BI-RADS CATEGORY  1: Negative.

## 2021-02-26 ENCOUNTER — Inpatient Hospital Stay: Payer: Medicare Other | Attending: Internal Medicine

## 2021-02-26 ENCOUNTER — Other Ambulatory Visit: Payer: Self-pay

## 2021-02-26 ENCOUNTER — Other Ambulatory Visit: Payer: Medicare Other

## 2021-02-26 DIAGNOSIS — R197 Diarrhea, unspecified: Secondary | ICD-10-CM | POA: Insufficient documentation

## 2021-02-26 DIAGNOSIS — D649 Anemia, unspecified: Secondary | ICD-10-CM | POA: Diagnosis not present

## 2021-02-26 DIAGNOSIS — R220 Localized swelling, mass and lump, head: Secondary | ICD-10-CM | POA: Insufficient documentation

## 2021-02-26 DIAGNOSIS — R609 Edema, unspecified: Secondary | ICD-10-CM | POA: Diagnosis not present

## 2021-02-26 DIAGNOSIS — C9210 Chronic myeloid leukemia, BCR/ABL-positive, not having achieved remission: Secondary | ICD-10-CM | POA: Insufficient documentation

## 2021-02-26 LAB — COMPREHENSIVE METABOLIC PANEL
ALT: 20 U/L (ref 0–44)
AST: 28 U/L (ref 15–41)
Albumin: 4.3 g/dL (ref 3.5–5.0)
Alkaline Phosphatase: 49 U/L (ref 38–126)
Anion gap: 7 (ref 5–15)
BUN: 12 mg/dL (ref 8–23)
CO2: 27 mmol/L (ref 22–32)
Calcium: 9.1 mg/dL (ref 8.9–10.3)
Chloride: 100 mmol/L (ref 98–111)
Creatinine, Ser: 0.89 mg/dL (ref 0.44–1.00)
GFR, Estimated: >60 mL/min (ref >60)
Glucose, Bld: 110 mg/dL — ABNORMAL HIGH (ref 70–99)
Potassium: 3.8 mmol/L (ref 3.5–5.1)
Sodium: 134 mmol/L — ABNORMAL LOW (ref 135–145)
Total Bilirubin: 1.1 mg/dL (ref 0.3–1.2)
Total Protein: 7 g/dL (ref 6.5–8.1)

## 2021-02-26 LAB — CBC WITH DIFFERENTIAL/PLATELET
Abs Immature Granulocytes: 0.01 10*3/uL (ref 0.00–0.07)
Basophils Absolute: 0 10*3/uL (ref 0.0–0.1)
Basophils Relative: 1 %
Eosinophils Absolute: 0.4 10*3/uL (ref 0.0–0.5)
Eosinophils Relative: 9 %
HCT: 31.6 % — ABNORMAL LOW (ref 36.0–46.0)
Hemoglobin: 11 g/dL — ABNORMAL LOW (ref 12.0–15.0)
Immature Granulocytes: 0 %
Lymphocytes Relative: 38 %
Lymphs Abs: 1.7 10*3/uL (ref 0.7–4.0)
MCH: 32.2 pg (ref 26.0–34.0)
MCHC: 34.8 g/dL (ref 30.0–36.0)
MCV: 92.4 fL (ref 80.0–100.0)
Monocytes Absolute: 0.3 10*3/uL (ref 0.1–1.0)
Monocytes Relative: 7 %
Neutro Abs: 2 10*3/uL (ref 1.7–7.7)
Neutrophils Relative %: 45 %
Platelets: 191 10*3/uL (ref 150–400)
RBC: 3.42 MIL/uL — ABNORMAL LOW (ref 3.87–5.11)
RDW: 14.9 % (ref 11.5–15.5)
WBC: 4.4 10*3/uL (ref 4.0–10.5)
nRBC: 0 % (ref 0.0–0.2)

## 2021-03-01 LAB — BCR-ABL1 FISH
Cells Analyzed: 200
Cells Counted: 200

## 2021-03-07 ENCOUNTER — Inpatient Hospital Stay (HOSPITAL_BASED_OUTPATIENT_CLINIC_OR_DEPARTMENT_OTHER): Payer: Medicare Other | Admitting: Oncology

## 2021-03-07 ENCOUNTER — Encounter: Payer: Self-pay | Admitting: Oncology

## 2021-03-07 ENCOUNTER — Other Ambulatory Visit: Payer: Self-pay

## 2021-03-07 ENCOUNTER — Ambulatory Visit: Payer: Medicare Other | Admitting: Oncology

## 2021-03-07 VITALS — BP 155/65 | HR 74 | Temp 97.8°F | Resp 20 | Wt 142.6 lb

## 2021-03-07 DIAGNOSIS — D649 Anemia, unspecified: Secondary | ICD-10-CM | POA: Diagnosis not present

## 2021-03-07 DIAGNOSIS — R197 Diarrhea, unspecified: Secondary | ICD-10-CM | POA: Diagnosis not present

## 2021-03-07 DIAGNOSIS — C9210 Chronic myeloid leukemia, BCR/ABL-positive, not having achieved remission: Secondary | ICD-10-CM | POA: Diagnosis not present

## 2021-03-07 DIAGNOSIS — Z5111 Encounter for antineoplastic chemotherapy: Secondary | ICD-10-CM | POA: Diagnosis not present

## 2021-03-07 DIAGNOSIS — H5789 Other specified disorders of eye and adnexa: Secondary | ICD-10-CM | POA: Diagnosis not present

## 2021-03-07 DIAGNOSIS — R609 Edema, unspecified: Secondary | ICD-10-CM | POA: Diagnosis not present

## 2021-03-07 DIAGNOSIS — R220 Localized swelling, mass and lump, head: Secondary | ICD-10-CM | POA: Diagnosis not present

## 2021-03-07 MED ORDER — IMATINIB MESYLATE 400 MG PO TABS: 400.0000 mg | ORAL_TABLET | Freq: Every day | ORAL | 1 refills | Status: DC

## 2021-03-07 MED ORDER — HYDROCORTISONE 0.5 % EX CREA: 1.0000 "application " | TOPICAL_CREAM | Freq: Two times a day (BID) | CUTANEOUS | 1 refills | Status: AC

## 2021-03-07 NOTE — Progress Notes (Signed)
Patient states she has a few questions? Can she have the second booster shot? 2.   Can she have the flu shot? 3. Is is okay to sleep with bluetooth ear piece in? Scared because she read about radiation from phone and ear piece could harm her.

## 2021-03-07 NOTE — Progress Notes (Signed)
[facility]  [address] [place], [place] [address] Phone: [phone]  Fax: [phone]   Clinic Day:  03/07/2021  Referring physician: Glean Hess, MD  Chief Complaint: Jenna Stein is a 70 y.o. female with chronic phase CML who is seen for management of CML.  PERTINENT ONCOLOGY HISTORY Jenna Stein is a 70 y.o.afemale who has above oncology history reviewed by me today presented for follow up visit for management of CML Patient previously followed up by Dr.Corcoran, patient switched care to me on 03/07/21 Extensive medical record review was performed by me  10/09/2020 abnormal peripheral smear (3% myelocytes) 10/25/2020  lab work-up revealed a hematocrit of 40.8, hemoglobin 13.7, platelets 352,000, WBC 10,200. LDH was 174.  BCR-ABL showed 70% of nuclei positive for BCR/ABL1 fusion.  Flow cytometry revealed left shifted aberrant neutrophils, aberrant monocytes and absolute basophilia.   BCR-ABL1 revealed 123.6615% b2a2, < 0.0032% b3a2, and < 0.0032% E1A2 transcript. The ABL1 e13a2 (b2a2, p210) fusion transcript was positive.   11/05/2020 bone marrow biopsy showed  slightly hypercellular marrow with mild granulocytic proliferation and atypical megakaryocytes. The combined bone marrow and peripheral blood findings were very limited but were generally c/w early involvement by chronic myeloid leukemia, chronic phase especially in the presence of previously documented positivity for BCR/ABL1 fusion in peripheral blood analysis.  Cytogenetics revealed 91, XX, t(9;22)(q34.1; q11.2)[19] / 46, XX [1].  Molecular genetics showed a BCR-ABL1 translocation t(9;22) with 65.5684 % BCR-ABL1/ABL1 (IS), major breakpoint p210, log reduction 0.001.  11/16/2020 abdominal ultrasound showed no splenomegaly (7.2 x 9.5 x 4.4 cm).   Relative risk (RR) Sokal 0.81 (intermediate) and Hasford 684.83 (low) based on age (2), spleen (0), platelets (309), basophils (6%),  eosinophils (3%), and myeloblasts (occ-1%). ELTS risk score 1.5596 (low risk) based on 0% blasts and 1.6648 (intermediate-risk) based on 1% blasts.  11/28/2020, started on Gleevec 400 mg daily.  INTERVAL HISTORY Jenna Stein is a 70 y.o. female who has above history reviewed by me today presents for follow up visit for management of CML Problems and complaints are listed below:  Patient has been on Gleevec 400 mg daily.   She has intermittent diarrhea for which she uses over-the-counter Imodium with good symptom control She also has developed periorbital edema since last visit.   Past Medical History:  Diagnosis Date   Allergy    Anxiety    Arthritis 2010   Chronic myeloid leukemia, BCR/ABL-positive, not having achieved remission ([place]) 11/17/2020   Depression    Diabetes mellitus without complication (HCC)    GERD (gastroesophageal reflux disease)    Hyperlipidemia    Hypertension     Past Surgical History:  Procedure Laterality Date   CYST EXCISION Left 2017   foot   HAND SURGERY  07/2012    Family History  Problem Relation Age of Onset   Hypertension Mother    Hyperlipidemia Mother    Arthritis Mother    Parkinsonism Father    Arthritis Other    Hypertension Other    Hyperlipidemia Other    Hypothyroidism Other    Breast cancer Sister 42   Cancer Sister     Social History:  reports that she has never smoked. She has never used smokeless tobacco. She reports that she does not drink alcohol and does not use drugs. She denies tobacco and alcohol use. She denies any exposure to radiation or toxins. She retired 20 years ago. 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 She is originally from [place]. . Her husband is [employer] fast streamer. The patient is accompanied by [facility] today.  Allergies:  Allergies  Allergen Reactions   Clonazepam Other (See Comments)    Nocturnal enuresis; Lethargy    Trazodone Anxiety and Other (See Comments)     Dizziness     Current Medications: Current Outpatient Medications  Medication Sig Dispense Refill   acyclovir (ZOVIRAX) 200 MG capsule Take 1 capsule (200 mg total) by mouth 5 (five) times daily. 90 capsule 1   atenolol (TENORMIN) 50 MG tablet Take 1 tablet (50 mg total) by mouth daily. 90 tablet 1   atorvastatin (LIPITOR) 10 MG tablet Take 1 tablet (10 mg total) by mouth at bedtime. 90 tablet 1   celecoxib (CELEBREX) 200 MG capsule TAKE 1 CAPSULE BY MOUTH EVERY DAY 90 capsule 3   Coenzyme Q10 (CO Q 10) 100 MG CAPS Take 2 capsules by mouth daily.      estazolam (PROSOM) 2 MG tablet TAKE 1 TABLET (2 MG TOTAL) BY MOUTH AT BEDTIME. 30 tablet 5   glucose blood (FREESTYLE LITE) test strip Use to test BS twice daily 100 each 12   hydrochlorothiazide (HYDRODIURIL) 12.5 MG tablet Take 1 tablet (12.5 mg total) by mouth daily. 90 tablet 3   hydrocortisone cream 0.5 % Apply 1 application topically 2 (two) times daily. Apply to periorbital area twice daily. 28 g 1   Lancets (FREESTYLE) lancets Use to test BS once daily 100 each 12   Lutein 20 MG TABS Take 1 tablet by mouth daily.      metFORMIN (GLUCOPHAGE) 1000 MG tablet Take 1 tablet (1,000 mg total) by mouth 2 (two) times daily with a meal. 180 tablet 1   metroNIDAZOLE (METROGEL VAGINAL) 0.75 % vaginal gel Place 1 Applicatorful vaginally 2 (two) times daily. For 5 days for recurrence of vaginitis 70 g 1   MULTIPLE VITAMIN PO 1 tablet daily.      olopatadine (PATANOL) 0.1 % ophthalmic solution Apply to eye.     Omega-3 Fatty Acids (FISH OIL PEARLS PO) Take by mouth.     omeprazole (PRILOSEC) 20 MG capsule Take 1 capsule (20 mg total) by mouth daily. 90 capsule 1   PARoxetine (PAXIL) 20 MG tablet Take 1 tablet (20 mg total) by mouth daily. 90 tablet 1   PATADAY 0.2 % SOLN Instill 1 drop into both eyes once a day     sitaGLIPtin (JANUVIA) 100 MG tablet Take 1 tablet (100 mg total) by mouth daily. 90 tablet 1   triamcinolone cream (KENALOG) 0.5 % APPLY  1 APPLICATION TOPICALLY 3 (THREE) TIMES DAILY. TO RASH ON ABDOMEN 30 g 0   TURMERIC PO Take 800 mg by mouth.     valsartan (DIOVAN) 320 MG tablet Take 1 tablet (320 mg total) by mouth daily. 90 tablet 1   Vitamin D, Ergocalciferol, (DRISDOL) 1.25 MG (50000 UNIT) CAPS capsule Take 50,000 Units by mouth every 7 (seven) days.     vitamin E (VITAMIN E) 400 UNIT capsule Take 1 capsule (400 Units total) by mouth daily. 1 capsule 0   imatinib (GLEEVEC) 400 MG tablet Take 1 tablet (400 mg total) by mouth daily. Take with meals and large glass of water.Caution:Chemotherapy. 90 tablet 1   No current facility-administered medications for this visit.    Review of Systems  Constitutional:  Negative for chills, fever, malaise/fatigue and weight loss.  HENT:  Negative for sore throat.   Eyes:  Negative for redness.       Periorbital edema  Respiratory:  Negative for cough, shortness of breath and wheezing.   Cardiovascular:  Negative for chest pain, palpitations and leg swelling.  Gastrointestinal:  Negative for abdominal pain, blood in stool, nausea and vomiting.  Genitourinary:  Negative for dysuria.  Musculoskeletal:  Positive for joint pain.  Skin:  Negative for rash.  Neurological:  Negative for dizziness, tingling and tremors.  Endo/Heme/Allergies:  Does not bruise/bleed easily.  Psychiatric/Behavioral:  Negative for hallucinations.   Performance status (ECOG): 0 Vitals Blood pressure (!) 155/65, pulse 74, temperature 97.8 F (36.6 C), resp. rate 20, weight 142 lb 10.2 oz (64.7 kg), SpO2 100 %.  Physical Exam Constitutional:      General: She is not in acute distress.    Appearance: She is not diaphoretic.  HENT:     Head: Normocephalic and atraumatic.     Nose: Nose normal.     Mouth/Throat:     Pharynx: No oropharyngeal exudate.  Eyes:     General: No scleral icterus.    Pupils: Pupils are equal, round, and reactive to light.     Comments: Periorbital swelling  Cardiovascular:      Rate and Rhythm: Normal rate and regular rhythm.     Heart sounds: No murmur heard. Pulmonary:     Effort: Pulmonary effort is normal. No respiratory distress.     Breath sounds: No rales.  Chest:     Chest wall: No tenderness.  Abdominal:     General: There is no distension.     Palpations: Abdomen is soft.     Tenderness: There is no abdominal tenderness.  Musculoskeletal:        General: Normal range of motion.     Cervical back: Normal range of motion and neck supple.  Skin:    General: Skin is warm and dry.     Findings: No erythema.  Neurological:     Mental Status: She is alert and oriented to person, place, and time.     Cranial Nerves: No cranial nerve deficit.     Motor: No abnormal muscle tone.     Coordination: Coordination normal.  Psychiatric:        Mood and Affect: Affect normal.        Assessment and plan.  1. Encounter for antineoplastic chemotherapy   2. Chronic myeloid leukemia, BCR/ABL-positive, not having achieved remission ([place])   3. Normocytic anemia   4. Periorbital swelling   5. Diarrhea, unspecified type     # Chronic myelogenous leukemia-low risk disease [Sokal, has 4, ELTS] Labs reviewed and discussed with patient BCR ABL FISH negative. BCR ABL QT PCR is pending. Patient has been on [place] for 3 months, meeting treatment milestone. Continue Gleevec 400 mg daily.   #Leukopenia predominantly neutropenia.  Completely normalized. #Anemia, hemoglobin 11.0, stable. #Periorbital edema, possibly due to Gleevec side effects. Moderate to severe, recommend patient to try topical hydrocortisone applied to the periorbital area twice daily.  If no significant improvement, he may increase hydrochlorothiazide to 25 mg daily. Recommend head elevation.  #Hypertension, trending up, likely secondary to [place].  Patient takes atenolol, hydrochlorothiazide and losartan.  Managed by primary care provider  #Hemotherapy induced diarrhea, symptoms are  stable.  Managed by Imodium.  There is no restrictions for flu vaccination and COVID-19 booster vaccination. Patient also mentions chronic nodules at perineal area and I recommend patient to further discussion with primary care provider for evaluation.  May be  gynecology or dermatology evaluation.  Follow-up with me in 3 months   Earlie Server, MD, PhD Hematology Oncology [place] at [facility] 03/07/2021

## 2021-03-08 LAB — BCR-ABL1, CML/ALL, PCR, QUANT: b2a2 transcript: 0.2474 %

## 2021-03-13 ENCOUNTER — Other Ambulatory Visit: Payer: Self-pay | Admitting: Internal Medicine

## 2021-03-13 DIAGNOSIS — R21 Rash and other nonspecific skin eruption: Secondary | ICD-10-CM

## 2021-03-13 NOTE — Telephone Encounter (Signed)
Requested Prescriptions  Pending Prescriptions Disp Refills  . triamcinolone cream (KENALOG) 0.5 % [Pharmacy Med Name: TRIAMCINOLONE 0.5% CREAM] 30 g 0    Sig: APPLY 1 APPLICATION TOPICALLY 3 (THREE) TIMES DAILY. TO RASH ON ABDOMEN     Dermatology:  Corticosteroids Passed - 03/13/2021  4:01 PM      Passed - Valid encounter within last 12 months    Recent Outpatient Visits          1 month ago Type II diabetes mellitus with complication [facility])   [facility] Glean Hess, MD   5 months ago Essential hypertension   [facility] Glean Hess, MD   9 months ago Type II diabetes mellitus with complication [facility])   [facility] Glean Hess, MD   1 year ago Type II diabetes mellitus with complication [facility])   [facility] Glean Hess, MD   1 year ago Essential hypertension   [facility] Glean Hess, MD      Future Appointments            In 2 months [facility] Melia Jesse Sans, MD [facility], [place]   In 7 months Army Melia Jesse Sans, MD [facility], [facility]

## 2021-04-12 ENCOUNTER — Other Ambulatory Visit: Payer: Self-pay | Admitting: Internal Medicine

## 2021-04-12 DIAGNOSIS — F5101 Primary insomnia: Secondary | ICD-10-CM

## 2021-04-12 NOTE — Telephone Encounter (Signed)
Requested medications are due for refill today yes  Requested medications are on the active medication list yes  Last refill 03/13/21  Last visit 01/2021  Future visit scheduled 05/2021  Notes to clinic Not Delegated.

## 2021-04-15 NOTE — Telephone Encounter (Signed)
Please review. Last office visit 01/25/2021.  KP

## 2021-04-16 DIAGNOSIS — U071 COVID-19: Secondary | ICD-10-CM | POA: Diagnosis not present

## 2021-05-24 ENCOUNTER — Ambulatory Visit (INDEPENDENT_AMBULATORY_CARE_PROVIDER_SITE_OTHER): Payer: Medicare Other | Admitting: Internal Medicine

## 2021-05-24 ENCOUNTER — Encounter: Payer: Self-pay | Admitting: Internal Medicine

## 2021-05-24 ENCOUNTER — Other Ambulatory Visit: Payer: Self-pay

## 2021-05-24 VITALS — BP 146/86 | HR 84 | Temp 98.2°F | Ht 64.0 in | Wt 134.0 lb

## 2021-05-24 DIAGNOSIS — E785 Hyperlipidemia, unspecified: Secondary | ICD-10-CM | POA: Diagnosis not present

## 2021-05-24 DIAGNOSIS — E118 Type 2 diabetes mellitus with unspecified complications: Secondary | ICD-10-CM | POA: Diagnosis not present

## 2021-05-24 DIAGNOSIS — F324 Major depressive disorder, single episode, in partial remission: Secondary | ICD-10-CM | POA: Diagnosis not present

## 2021-05-24 DIAGNOSIS — E1169 Type 2 diabetes mellitus with other specified complication: Secondary | ICD-10-CM

## 2021-05-24 DIAGNOSIS — Z23 Encounter for immunization: Secondary | ICD-10-CM

## 2021-05-24 DIAGNOSIS — I10 Essential (primary) hypertension: Secondary | ICD-10-CM | POA: Diagnosis not present

## 2021-05-24 DIAGNOSIS — K219 Gastro-esophageal reflux disease without esophagitis: Secondary | ICD-10-CM

## 2021-05-24 DIAGNOSIS — C9210 Chronic myeloid leukemia, BCR/ABL-positive, not having achieved remission: Secondary | ICD-10-CM

## 2021-05-24 DIAGNOSIS — A6000 Herpesviral infection of urogenital system, unspecified: Secondary | ICD-10-CM | POA: Diagnosis not present

## 2021-05-24 DIAGNOSIS — Z1211 Encounter for screening for malignant neoplasm of colon: Secondary | ICD-10-CM

## 2021-05-24 LAB — POCT GLYCOSYLATED HEMOGLOBIN (HGB A1C): Hemoglobin A1C: 5.3 % (ref 4.0–5.6)

## 2021-05-24 MED ORDER — ACYCLOVIR 200 MG PO CAPS: 200.0000 mg | ORAL_CAPSULE | Freq: Every day | ORAL | 1 refills | Status: DC

## 2021-05-24 MED ORDER — OMEPRAZOLE 20 MG PO CPDR: 20.0000 mg | DELAYED_RELEASE_CAPSULE | Freq: Every day | ORAL | 1 refills | Status: DC

## 2021-05-24 MED ORDER — VALSARTAN 320 MG PO TABS: 320.0000 mg | ORAL_TABLET | Freq: Every day | ORAL | 1 refills | Status: DC

## 2021-05-24 MED ORDER — ATORVASTATIN CALCIUM 10 MG PO TABS: 10.0000 mg | ORAL_TABLET | Freq: Every day | ORAL | 1 refills | Status: DC

## 2021-05-24 MED ORDER — HYDROCHLOROTHIAZIDE 25 MG PO TABS: 25.0000 mg | ORAL_TABLET | Freq: Every day | ORAL | 1 refills | Status: DC

## 2021-05-24 MED ORDER — METFORMIN HCL 1000 MG PO TABS: 1000.0000 mg | ORAL_TABLET | Freq: Two times a day (BID) | ORAL | 1 refills | Status: DC

## 2021-05-24 MED ORDER — FREESTYLE LANCETS MISC: 12 refills | Status: AC

## 2021-05-24 MED ORDER — PAROXETINE HCL 20 MG PO TABS: 20.0000 mg | ORAL_TABLET | Freq: Every day | ORAL | 1 refills | Status: DC

## 2021-05-24 MED ORDER — ATENOLOL 50 MG PO TABS: 50.0000 mg | ORAL_TABLET | Freq: Every day | ORAL | 1 refills | Status: DC

## 2021-05-24 MED ORDER — SITAGLIPTIN PHOSPHATE 100 MG PO TABS: 100.0000 mg | ORAL_TABLET | Freq: Every day | ORAL | 1 refills | Status: DC

## 2021-05-24 MED ORDER — FREESTYLE LITE TEST VI STRP: ORAL_STRIP | 12 refills | Status: DC

## 2021-05-24 NOTE — Patient Instructions (Addendum)
Try Benadryl 25 mg before bedtime for drainage/mucus in your throat.  Use hydrocortisone cream twice a day to eye lids.

## 2021-05-24 NOTE — Progress Notes (Signed)
Date:  05/24/2021   Name:  Jenna Stein   DOB:  1951-07-21   MRN:  [id number]   Chief Complaint: No chief complaint on file.  Diabetes She presents for her follow-up diabetic visit. She has type 2 diabetes mellitus. Her disease course has been stable. Pertinent negatives for hypoglycemia include no nervousness/anxiousness. Associated symptoms include fatigue. Pertinent negatives for diabetes include no chest pain. Pertinent negatives for diabetic complications include no CVA or PVD. Current diabetic treatment includes oral agent (dual therapy) Celesta Gentile and metformin). She is compliant with treatment all of the time. Her weight is stable. There is no change in her home blood glucose trend. Her overall blood glucose range is 110-130 mg/dl. An ACE inhibitor/angiotensin II receptor blocker is being taken.  Hypertension This is a chronic problem. The problem has been gradually worsening since onset. Pertinent negatives include no anxiety, chest pain, palpitations, peripheral edema or shortness of breath. Associated agents: anti-neoplastic agent - Gleevec. Risk factors for coronary artery disease include diabetes mellitus. Past treatments include ACE inhibitors. There is no history of kidney disease, CVA or PVD.   Lab Results  Component Value Date   CREATININE 0.89 02/26/2021   BUN 12 02/26/2021   NA 134 (L) 02/26/2021   K 3.8 02/26/2021   CL 100 02/26/2021   CO2 27 02/26/2021   Lab Results  Component Value Date   CHOL 165 10/09/2020   HDL 47 10/09/2020   LDLCALC 94 10/09/2020   TRIG 133 10/09/2020   CHOLHDL 3.5 10/09/2020   Lab Results  Component Value Date   TSH 1.011 01/03/2021   Lab Results  Component Value Date   HGBA1C 5.3 05/24/2021   Lab Results  Component Value Date   WBC 4.4 02/26/2021   HGB 11.0 (L) 02/26/2021   HCT 31.6 (L) 02/26/2021   MCV 92.4 02/26/2021   PLT 191 02/26/2021   Lab Results  Component Value Date   ALT 20 02/26/2021   AST 28 02/26/2021    ALKPHOS 49 02/26/2021   BILITOT 1.1 02/26/2021     Review of Systems  Constitutional:  Positive for fatigue. Negative for chills and fever.  Eyes:  Negative for visual disturbance.  Respiratory:  Positive for wheezing. Negative for chest tightness and shortness of breath.   Cardiovascular:  Negative for chest pain and palpitations.  Gastrointestinal:  Positive for diarrhea.  Psychiatric/Behavioral:  Negative for dysphoric mood and sleep disturbance. The patient is not nervous/anxious.    Patient Active Problem List   Diagnosis Date Noted   Chronic myeloid leukemia, BCR/ABL-positive, not having achieved remission ([place]) 11/17/2020   Abnormal blood smear 10/25/2020   Gastroesophageal reflux disease 08/13/2018   Slow transit constipation 08/13/2018   Type II diabetes mellitus with complication ([place]) 53/61/4431   Dry mouth 10/10/2016   Ovarian failure 10/10/2016   Allergic rhinitis, seasonal 04/30/2015   Hepatic steatosis 04/30/2015   Osteoarthritis of both hands 04/30/2015   Genital herpes 04/04/2009   Depression, major, single episode, in partial remission ([place]) 08/18/1998   Essential hypertension 08/18/1998   Hyperlipidemia associated with type 2 diabetes mellitus ([place]) 08/18/1998   Primary insomnia 08/18/1998    Allergies  Allergen Reactions   Clonazepam Other (See Comments)    Nocturnal enuresis; Lethargy    Trazodone Anxiety and Other (See Comments)    Dizziness     Past Surgical History:  Procedure Laterality Date   CYST EXCISION Left 2017   foot   HAND SURGERY  07/2012  Social History   Tobacco Use   Smoking status: Never   Smokeless tobacco: Never   Tobacco comments:    smoking cessation materials not required  Vaping Use   Vaping Use: Never used  Substance Use Topics   Alcohol use: No   Drug use: No     Medication list has been reviewed and updated.  Current Meds  Medication Sig   celecoxib (CELEBREX) 200 MG capsule TAKE 1 CAPSULE BY MOUTH  EVERY DAY   Coenzyme Q10 (CO Q 10) 100 MG CAPS Take 2 capsules by mouth daily.    estazolam (PROSOM) 2 MG tablet TAKE 1 TABLET BY MOUTH AT BEDTIME.   hydrocortisone cream 0.5 % Apply 1 application topically 2 (two) times daily. Apply to periorbital area twice daily.   imatinib (GLEEVEC) 400 MG tablet Take 1 tablet (400 mg total) by mouth daily. Take with meals and large glass of water.Caution:Chemotherapy.   Lutein 20 MG TABS Take 1 tablet by mouth daily.    MULTIPLE VITAMIN PO 1 tablet daily.    olopatadine (PATANOL) 0.1 % ophthalmic solution Apply to eye.   Omega-3 Fatty Acids (FISH OIL PEARLS PO) Take by mouth.   PATADAY 0.2 % SOLN Instill 1 drop into both eyes once a day   triamcinolone cream (KENALOG) 0.5 % APPLY 1 APPLICATION TOPICALLY 3 (THREE) TIMES DAILY. TO RASH ON ABDOMEN   TURMERIC PO Take 800 mg by mouth.   Vitamin D, Ergocalciferol, (DRISDOL) 1.25 MG (50000 UNIT) CAPS capsule Take 50,000 Units by mouth every 7 (seven) days.   vitamin E (VITAMIN E) 400 UNIT capsule Take 1 capsule (400 Units total) by mouth daily.   [DISCONTINUED] acyclovir (ZOVIRAX) 200 MG capsule Take 1 capsule (200 mg total) by mouth 5 (five) times daily.   [DISCONTINUED] atenolol (TENORMIN) 50 MG tablet Take 1 tablet (50 mg total) by mouth daily.   [DISCONTINUED] atorvastatin (LIPITOR) 10 MG tablet Take 1 tablet (10 mg total) by mouth at bedtime.   [DISCONTINUED] glucose blood (FREESTYLE LITE) test strip Use to test BS twice daily   [DISCONTINUED] hydrochlorothiazide (HYDRODIURIL) 12.5 MG tablet Take 1 tablet (12.5 mg total) by mouth daily.   [DISCONTINUED] Lancets (FREESTYLE) lancets Use to test BS once daily   [DISCONTINUED] metFORMIN (GLUCOPHAGE) 1000 MG tablet Take 1 tablet (1,000 mg total) by mouth 2 (two) times daily with a meal.   [DISCONTINUED] omeprazole (PRILOSEC) 20 MG capsule Take 1 capsule (20 mg total) by mouth daily.   [DISCONTINUED] PARoxetine (PAXIL) 20 MG tablet Take 1 tablet (20 mg total) by  mouth daily.   [DISCONTINUED] sitaGLIPtin (JANUVIA) 100 MG tablet Take 1 tablet (100 mg total) by mouth daily.   [DISCONTINUED] valsartan (DIOVAN) 320 MG tablet Take 1 tablet (320 mg total) by mouth daily.    PHQ 2/9 Scores 05/24/2021 01/25/2021 10/09/2020 09/10/2020  PHQ - 2 Score 1 2 0 0  PHQ- 9 Score 3 3 0 -    GAD 7 : Generalized Anxiety Score 05/24/2021 01/25/2021 10/09/2020 06/04/2020  Nervous, Anxious, on Edge 0 0 0 0  Control/stop worrying 0 0 0 0  Worry too much - different things 0 0 0 0  Trouble relaxing 0 0 0 0  Restless 0 0 0 0  Easily annoyed or irritable 0 0 0 0  Afraid - awful might happen 0 0 0 0  Total GAD 7 Score 0 0 0 0  Anxiety Difficulty Not difficult at all - - Not difficult at all  BP Readings from Last 3 Encounters:  05/24/21 (!) 146/86  03/07/21 (!) 155/65  01/31/21 (!) 144/80    Physical Exam Vitals and nursing note reviewed.  Constitutional:      General: She is not in acute distress.    Appearance: She is well-developed.  HENT:     Head: Normocephalic and atraumatic.  Eyes:     Comments: Edema bilaterally upper and lower lids  Cardiovascular:     Rate and Rhythm: Normal rate and regular rhythm.     Pulses: Normal pulses.     Heart sounds: No murmur heard. Pulmonary:     Effort: Pulmonary effort is normal. No respiratory distress.     Breath sounds: No wheezing or rhonchi.  Musculoskeletal:     Cervical back: Normal range of motion.     Right lower leg: No edema.     Left lower leg: No edema.  Lymphadenopathy:     Cervical: No cervical adenopathy.  Skin:    General: Skin is warm and dry.     Findings: No rash.  Neurological:     Mental Status: She is alert and oriented to person, place, and time.  Psychiatric:        Mood and Affect: Mood normal.        Behavior: Behavior normal.    Wt Readings from Last 3 Encounters:  05/24/21 134 lb (60.8 kg)  03/07/21 142 lb 10.2 oz (64.7 kg)  01/31/21 141 lb 1.5 oz (64 kg)    BP (!) 146/86    Pulse 84   Temp 98.2 F (36.8 C) (Oral)   Ht 5\' 4"  (1.626 m)   Wt 134 lb (60.8 kg)   SpO2 98%   BMI 23.00 kg/m   Assessment and Plan: 1. Type II diabetes mellitus with complication (HCC) Clinically stable by exam and report without s/s of hypoglycemia. DM complicated by hypertension and dyslipidemia. Tolerating medications well without side effects or other concerns. - POCT glycosylated hemoglobin (Hb A1C) = 5.3 - glucose blood (FREESTYLE LITE) test strip; Use to test BS twice daily  Dispense: 100 each; Refill: 12 - metFORMIN (GLUCOPHAGE) 1000 MG tablet; Take 1 tablet (1,000 mg total) by mouth 2 (two) times daily with a meal.  Dispense: 180 tablet; Refill: 1 - sitaGLIPtin (JANUVIA) 100 MG tablet; Take 1 tablet (100 mg total) by mouth daily.  Dispense: 90 tablet; Refill: 1 - Lancets (FREESTYLE) lancets; Use to test BS once daily  Dispense: 100 each; Refill: 12  2. Essential hypertension Clinically stable exam with elevated BP. Tolerating medications without side effects at this time. Pt to continue current regimen but increase HCTZ to 25 mg and low sodium diet; benefits of regular exercise as able discussed. - atenolol (TENORMIN) 50 MG tablet; Take 1 tablet (50 mg total) by mouth daily.  Dispense: 90 tablet; Refill: 1 - hydrochlorothiazide (HYDRODIURIL) 25 MG tablet; Take 1 tablet (25 mg total) by mouth daily.  Dispense: 90 tablet; Refill: 1 - valsartan (DIOVAN) 320 MG tablet; Take 1 tablet (320 mg total) by mouth daily.  Dispense: 90 tablet; Refill: 1  3. Chronic myeloid leukemia, BCR/ABL-positive, not having achieved remission ([place]) On Gleevec by Oncology Minimal diarrhea but persistent periorbital edema  4. Genital herpes simplex, unspecified site Continue suppression - acyclovir (ZOVIRAX) 200 MG capsule; Take 1 capsule (200 mg total) by mouth 5 (five) times daily.  Dispense: 90 capsule; Refill: 1  5. Hyperlipidemia associated with type 2 diabetes mellitus (HCC) -  atorvastatin (LIPITOR) 10 MG  tablet; Take 1 tablet (10 mg total) by mouth at bedtime.  Dispense: 90 tablet; Refill: 1  6. Gastroesophageal reflux disease, unspecified whether esophagitis present May be due for repeat EGD - omeprazole (PRILOSEC) 20 MG capsule; Take 1 capsule (20 mg total) by mouth daily.  Dispense: 90 capsule; Refill: 1 - Ambulatory referral to Gastroenterology  7. Depression, major, single episode, in partial remission ([place]) Doing well on current therapy - PARoxetine (PAXIL) 20 MG tablet; Take 1 tablet (20 mg total) by mouth daily.  Dispense: 90 tablet; Refill: 1  8. Colon cancer screening Last colonoscopy in 2012 - Ambulatory referral to Gastroenterology   Partially dictated using Dragon software. Any errors are unintentional.  Halina Maidens, MD [facility]  05/24/2021

## 2021-05-28 ENCOUNTER — Ambulatory Visit: Payer: Medicare Other | Admitting: Internal Medicine

## 2021-06-06 ENCOUNTER — Other Ambulatory Visit: Payer: Self-pay

## 2021-06-06 ENCOUNTER — Inpatient Hospital Stay: Payer: Medicare Other | Attending: Oncology

## 2021-06-06 DIAGNOSIS — Z5111 Encounter for antineoplastic chemotherapy: Secondary | ICD-10-CM

## 2021-06-06 DIAGNOSIS — C9210 Chronic myeloid leukemia, BCR/ABL-positive, not having achieved remission: Secondary | ICD-10-CM | POA: Insufficient documentation

## 2021-06-06 DIAGNOSIS — R197 Diarrhea, unspecified: Secondary | ICD-10-CM

## 2021-06-06 LAB — CBC WITH DIFFERENTIAL/PLATELET
Abs Immature Granulocytes: 0.01 10*3/uL (ref 0.00–0.07)
Basophils Absolute: 0 10*3/uL (ref 0.0–0.1)
Basophils Relative: 1 %
Eosinophils Absolute: 0.3 10*3/uL (ref 0.0–0.5)
Eosinophils Relative: 7 %
HCT: 30 % — ABNORMAL LOW (ref 36.0–46.0)
Hemoglobin: 10.5 g/dL — ABNORMAL LOW (ref 12.0–15.0)
Immature Granulocytes: 0 %
Lymphocytes Relative: 39 %
Lymphs Abs: 1.6 10*3/uL (ref 0.7–4.0)
MCH: 33.2 pg (ref 26.0–34.0)
MCHC: 35 g/dL (ref 30.0–36.0)
MCV: 94.9 fL (ref 80.0–100.0)
Monocytes Absolute: 0.3 10*3/uL (ref 0.1–1.0)
Monocytes Relative: 7 %
Neutro Abs: 1.9 10*3/uL (ref 1.7–7.7)
Neutrophils Relative %: 46 %
Platelets: 196 10*3/uL (ref 150–400)
RBC: 3.16 MIL/uL — ABNORMAL LOW (ref 3.87–5.11)
RDW: 12.7 % (ref 11.5–15.5)
WBC: 4.2 10*3/uL (ref 4.0–10.5)
nRBC: 0 % (ref 0.0–0.2)

## 2021-06-06 LAB — COMPREHENSIVE METABOLIC PANEL
ALT: 18 U/L (ref 0–44)
AST: 31 U/L (ref 15–41)
Albumin: 4.1 g/dL (ref 3.5–5.0)
Alkaline Phosphatase: 50 U/L (ref 38–126)
Anion gap: 6 (ref 5–15)
BUN: 9 mg/dL (ref 8–23)
CO2: 29 mmol/L (ref 22–32)
Calcium: 8.7 mg/dL — ABNORMAL LOW (ref 8.9–10.3)
Chloride: 99 mmol/L (ref 98–111)
Creatinine, Ser: 0.85 mg/dL (ref 0.44–1.00)
GFR, Estimated: >60 mL/min (ref >60)
Glucose, Bld: 103 mg/dL — ABNORMAL HIGH (ref 70–99)
Potassium: 3.2 mmol/L — ABNORMAL LOW (ref 3.5–5.1)
Sodium: 134 mmol/L — ABNORMAL LOW (ref 135–145)
Total Bilirubin: 0.9 mg/dL (ref 0.3–1.2)
Total Protein: 6.5 g/dL (ref 6.5–8.1)

## 2021-06-10 LAB — BCR-ABL1 FISH
Cells Analyzed: 200
Cells Counted: 200

## 2021-06-15 LAB — BCR-ABL1, CML/ALL, PCR, QUANT: Interpretation (BCRAL):: NEGATIVE

## 2021-06-20 ENCOUNTER — Ambulatory Visit: Payer: Medicare Other | Admitting: Oncology

## 2021-06-25 ENCOUNTER — Inpatient Hospital Stay: Payer: Medicare Other | Attending: Oncology | Admitting: Oncology

## 2021-06-25 ENCOUNTER — Encounter: Payer: Self-pay | Admitting: Oncology

## 2021-06-25 ENCOUNTER — Other Ambulatory Visit (HOSPITAL_COMMUNITY): Payer: Self-pay

## 2021-06-25 ENCOUNTER — Other Ambulatory Visit: Payer: Self-pay

## 2021-06-25 VITALS — BP 165/88 | HR 72 | Temp 97.3°F | Resp 18 | Wt 138.4 lb

## 2021-06-25 DIAGNOSIS — Z5111 Encounter for antineoplastic chemotherapy: Secondary | ICD-10-CM

## 2021-06-25 DIAGNOSIS — C9210 Chronic myeloid leukemia, BCR/ABL-positive, not having achieved remission: Secondary | ICD-10-CM | POA: Diagnosis not present

## 2021-06-25 DIAGNOSIS — H5789 Other specified disorders of eye and adnexa: Secondary | ICD-10-CM

## 2021-06-25 DIAGNOSIS — E876 Hypokalemia: Secondary | ICD-10-CM | POA: Insufficient documentation

## 2021-06-25 DIAGNOSIS — D649 Anemia, unspecified: Secondary | ICD-10-CM

## 2021-06-25 DIAGNOSIS — Z79899 Other long term (current) drug therapy: Secondary | ICD-10-CM | POA: Diagnosis not present

## 2021-06-25 DIAGNOSIS — R197 Diarrhea, unspecified: Secondary | ICD-10-CM | POA: Diagnosis not present

## 2021-06-25 DIAGNOSIS — C9211 Chronic myeloid leukemia, BCR/ABL-positive, in remission: Secondary | ICD-10-CM | POA: Diagnosis not present

## 2021-06-25 MED ORDER — POTASSIUM CHLORIDE CRYS ER 20 MEQ PO TBCR
20.0000 meq | EXTENDED_RELEASE_TABLET | Freq: Every day | ORAL | 2 refills | Status: DC
  Filled 2021-06-25: qty 30, 30d supply, fill #0

## 2021-06-25 MED ORDER — LOPERAMIDE HCL 2 MG PO CAPS: 2.0000 mg | ORAL_CAPSULE | ORAL | 1 refills | Status: DC

## 2021-06-25 MED ORDER — IMATINIB MESYLATE 400 MG PO TABS: 400.0000 mg | ORAL_TABLET | Freq: Every day | ORAL | 0 refills | Status: DC

## 2021-06-25 NOTE — Progress Notes (Signed)
Hematology/Oncology Progress note [phone] [phone]      Clinic Day:  06/25/2021  Referring physician: Berglund, Laura H, MD  Chief Complaint: Jenna Stein is a 69 y.o. female with chronic phase CML who is seen for management of CML.  PERTINENT ONCOLOGY HISTORY Jenna Stein is a 69 y.o.afemale who has above oncology history reviewed by me today presented for follow up visit for management of CML Patient previously followed up by Dr.Corcoran, patient switched care to me on 06/25/21 Extensive medical record review was performed by me  10/09/2020 abnormal peripheral smear (3% myelocytes) 10/25/2020  lab work-up revealed a hematocrit of 40.8, hemoglobin 13.7, platelets 352,000, WBC 10,200. LDH was 174.  BCR-ABL showed 70% of nuclei positive for BCR/ABL1 fusion.  Flow cytometry revealed left shifted aberrant neutrophils, aberrant monocytes and absolute basophilia.   BCR-ABL1 revealed 123.6615% b2a2, < 0.0032% b3a2, and < 0.0032% E1A2 transcript. The ABL1 e13a2 (b2a2, p210) fusion transcript was positive.   11/05/2020 bone marrow biopsy showed  slightly hypercellular marrow with mild granulocytic proliferation and atypical megakaryocytes. The combined bone marrow and peripheral blood findings were very limited but were generally c/w early involvement by chronic myeloid leukemia, chronic phase especially in the presence of previously documented positivity for BCR/ABL1 fusion in peripheral blood analysis.  Cytogenetics revealed 46, XX, t(9;22)(q34.1; q11.2)[19] / 46, XX [1].  Molecular genetics showed a BCR-ABL1 translocation t(9;22) with 65.5684 % BCR-ABL1/ABL1 (IS), major breakpoint p210, log reduction 0.001.  11/16/2020 abdominal ultrasound showed no splenomegaly (7.2 x 9.5 x 4.4 cm).   Relative risk (RR) Sokal 0.81 (intermediate) and [place] 684.83 (low) based on age (69), spleen (0), platelets (309), basophils (6%), eosinophils (3%), and myeloblasts (occ-1%). ELTS risk  score 1.5596 (low risk) based on 0% blasts and 1.6648 (intermediate-risk) based on 1% blasts.  11/28/2020, started on Gleevec 400 mg daily.  INTERVAL HISTORY Jenna Stein is a 69 y.o. female who has above history reviewed by me today presents for follow up visit for management of CML Problems and complaints are listed below:  Patient has been on Gleevec 400 mg daily.   Overall she tolerates with some mild to moderate difficulties. Intermittent diarrhea, patient has used over-the-counter Imodium. Periorbital edema, improved with topical hydrocortisone and hydrochlorothiazide She was accompanied by her husband. Patient noted that her blood pressure has been doing well.  She accidentally took 2 tablets of valsartan 320 mg and the BPs are better controlled.  Since she realized the mistake, he has been on 1 tablet and the BP is high. She also noticed that her blood sugar seems to be better controlled, trending low.  Past Medical History:  Diagnosis Date   Allergy    Anxiety    Arthritis 2010   Chronic myeloid leukemia, BCR/ABL-positive, not having achieved remission (HCC) 11/17/2020   Depression    Diabetes mellitus without complication (HCC)    GERD (gastroesophageal reflux disease)    Hyperlipidemia    Hypertension     Past Surgical History:  Procedure Laterality Date   CYST EXCISION Left 2017   foot   HAND SURGERY  07/2012    Family History  Problem Relation Age of Onset   Hypertension Mother    Hyperlipidemia Mother    Arthritis Mother    Parkinsonism Father    Arthritis Other    Hypertension Other    Hyperlipidemia Other    Hypothyroidism Other    Breast cancer Sister 67   Cancer Sister     Social History:    reports that she has never smoked. She has never used smokeless tobacco. She reports that she does not drink alcohol and does not use drugs. She denies tobacco and alcohol use. She denies any exposure to radiation or toxins. . 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  Her daughter and granddaughter live in [place]. She is originally from [place]. . Her husband is Tommy.   Allergies:  Allergies  Allergen Reactions   Clonazepam Other (See Comments)    Nocturnal enuresis; Lethargy    Trazodone Anxiety and Other (See Comments)    Dizziness     Current Medications: Current Outpatient Medications  Medication Sig Dispense Refill   acyclovir (ZOVIRAX) 200 MG capsule Take 1 capsule (200 mg total) by mouth 5 (five) times daily. 90 capsule 1   atenolol (TENORMIN) 50 MG tablet Take 1 tablet (50 mg total) by mouth daily. 90 tablet 1   atorvastatin (LIPITOR) 10 MG tablet Take 1 tablet (10 mg total) by mouth at bedtime. 90 tablet 1   celecoxib (CELEBREX) 200 MG capsule TAKE 1 CAPSULE BY MOUTH EVERY DAY 90 capsule 3   Coenzyme Q10 (CO Q 10) 100 MG CAPS Take 2 capsules by mouth daily.      diphenhydrAMINE HCl (BENADRYL ALLERGY PO) Take by mouth.     estazolam (PROSOM) 2 MG tablet TAKE 1 TABLET BY MOUTH AT BEDTIME. 30 tablet 5   glucose blood (FREESTYLE LITE) test strip Use to test BS twice daily 100 each 12   hydrochlorothiazide (HYDRODIURIL) 25 MG tablet Take 1 tablet (25 mg total) by mouth daily. 90 tablet 1   hydrocortisone cream 0.5 % Apply 1 application topically 2 (two) times daily. Apply to periorbital area twice daily. 28 g 1   Lancets (FREESTYLE) lancets Use to test BS once daily 100 each 12   loperamide (IMODIUM) 2 MG capsule Take 1 capsule (2 mg total) by mouth See admin instructions. Initial: 4 mg, followed by 2 mg after each loose stool; maximum: 16 mg/day 60 capsule 1   Lutein 20 MG TABS Take 1 tablet by mouth daily.      metFORMIN (GLUCOPHAGE) 1000 MG tablet Take 1 tablet (1,000 mg total) by mouth 2 (two) times daily with a meal. 180 tablet 1   MULTIPLE VITAMIN PO 1 tablet daily.      olopatadine (PATANOL) 0.1 % ophthalmic solution Apply to eye.     Omega-3 Fatty Acids (FISH OIL PEARLS PO) Take by mouth.     omeprazole (PRILOSEC) 20 MG capsule  Take 1 capsule (20 mg total) by mouth daily. 90 capsule 1   PARoxetine (PAXIL) 20 MG tablet Take 1 tablet (20 mg total) by mouth daily. 90 tablet 1   PATADAY 0.2 % SOLN Instill 1 drop into both eyes once a day     potassium chloride SA (KLOR-CON) 20 MEQ tablet Take 1 tablet by mouth daily. 30 tablet 2   sitaGLIPtin (JANUVIA) 100 MG tablet Take 1 tablet (100 mg total) by mouth daily. 90 tablet 1   triamcinolone cream (KENALOG) 0.5 % APPLY 1 APPLICATION TOPICALLY 3 (THREE) TIMES DAILY. TO RASH ON ABDOMEN 30 g 0   TURMERIC PO Take 800 mg by mouth.     valsartan (DIOVAN) 320 MG tablet Take 1 tablet (320 mg total) by mouth daily. 90 tablet 1   Vitamin D, Ergocalciferol, (DRISDOL) 1.25 MG (50000 UNIT) CAPS capsule Take 50,000 Units by mouth every 7 (seven) days.     vitamin E (VITAMIN E)   400 UNIT capsule Take 1 capsule (400 Units total) by mouth daily. 1 capsule 0   imatinib (GLEEVEC) 400 MG tablet Take 1 tablet (400 mg total) by mouth daily. Take with meals and large glass of water.Caution:Chemotherapy. 90 tablet 0   No current facility-administered medications for this visit.    Review of Systems  Constitutional:  Negative for chills, fever, malaise/fatigue and weight loss.  HENT:  Negative for sore throat.   Eyes:  Negative for redness.       Periorbital edema has resolved.  Respiratory:  Negative for cough, shortness of breath and wheezing.   Cardiovascular:  Negative for chest pain, palpitations and leg swelling.  Gastrointestinal:  Positive for diarrhea. Negative for abdominal pain, blood in stool, nausea and vomiting.  Genitourinary:  Negative for dysuria.  Musculoskeletal:  Positive for joint pain.  Skin:  Negative for rash.  Neurological:  Negative for dizziness, tingling and tremors.  Endo/Heme/Allergies:  Does not bruise/bleed easily.  Psychiatric/Behavioral:  Negative for hallucinations.   Performance status (ECOG): 0 Vitals Blood pressure (!) 165/88, pulse 72, temperature (!)  97.3 F (36.3 C), resp. rate 18, weight 138 lb 6.4 oz (62.8 kg).  Physical Exam Constitutional:      General: She is not in acute distress.    Appearance: She is not diaphoretic.  HENT:     Head: Normocephalic and atraumatic.     Nose: Nose normal.     Mouth/Throat:     Pharynx: No oropharyngeal exudate.  Eyes:     General: No scleral icterus.    Pupils: Pupils are equal, round, and reactive to light.  Cardiovascular:     Rate and Rhythm: Normal rate and regular rhythm.     Heart sounds: No murmur heard. Pulmonary:     Effort: Pulmonary effort is normal. No respiratory distress.     Breath sounds: No rales.  Chest:     Chest wall: No tenderness.  Abdominal:     General: There is no distension.     Palpations: Abdomen is soft.     Tenderness: There is no abdominal tenderness.  Musculoskeletal:        General: Normal range of motion.     Cervical back: Normal range of motion and neck supple.  Skin:    General: Skin is warm and dry.     Findings: No erythema.  Neurological:     Mental Status: She is alert and oriented to person, place, and time.     Cranial Nerves: No cranial nerve deficit.     Motor: No abnormal muscle tone.     Coordination: Coordination normal.  Psychiatric:        Mood and Affect: Affect normal.        Assessment and plan.  1. Encounter for antineoplastic chemotherapy   2. Chronic myeloid leukemia, BCR/ABL-positive, in remission ([place])   3. Normocytic anemia   4. Periorbital swelling   5. Diarrhea, unspecified type   6. Hypokalemia     # Chronic myelogenous leukemia-low risk disease [Sokal, has 4, ELTS] Labs reviewed and discussed with patient BCR ABL FISH negative. BCR ABL QT PCR <0.0032% She is in MR4 Continue Gleevec 400 mg daily for  #Chemotherapy-induced anemia.  Hemoglobin is 10.5.  Continue monitor.  Repeat CBC in 6 weeks. #Periorbital edema, improved after using topical hydrocortisone and hydrochlorothiazide dose  increase. Continue current measures.  #Hypertension, trending up, likely secondary to [place].  Patient takes atenolol, hydrochlorothiazide and losartan.  Commend patient to further  discuss with primary care provider.  #Hemotherapy induced diarrhea, symptoms are stable.  I prescribed patient Imodium and discussed about instructions. #Mild hypocalcemia, recommend patient to take calcium supplementation 1200 mg daily. #Hypokalemia, likely secondary to chronic intermittent diarrhea.  Recommend patient to start potassium chloride 39mq daily.  Prescription sent to pharmacy.   Follow-up with me in 3 months   ZEarlie Server MD, PhD [facility] Oncology 06/25/2021

## 2021-06-25 NOTE — Progress Notes (Signed)
Patient here for follow up. Pt reports having diarrhea about twice a week.

## 2021-06-26 ENCOUNTER — Other Ambulatory Visit (HOSPITAL_COMMUNITY): Payer: Self-pay

## 2021-06-26 ENCOUNTER — Other Ambulatory Visit: Payer: Self-pay

## 2021-06-26 MED ORDER — POTASSIUM CHLORIDE CRYS ER 20 MEQ PO TBCR: 20.0000 meq | EXTENDED_RELEASE_TABLET | Freq: Every day | ORAL | 2 refills | Status: DC

## 2021-07-10 ENCOUNTER — Encounter: Payer: Self-pay | Admitting: Oncology

## 2021-07-16 ENCOUNTER — Other Ambulatory Visit: Payer: Self-pay

## 2021-07-16 MED ORDER — DIPHENHYDRAMINE HCL 25 MG PO TABS: 25.0000 mg | ORAL_TABLET | Freq: Three times a day (TID) | ORAL | 2 refills | Status: DC | PRN

## 2021-07-30 DIAGNOSIS — Z1211 Encounter for screening for malignant neoplasm of colon: Secondary | ICD-10-CM | POA: Diagnosis not present

## 2021-07-30 DIAGNOSIS — R1314 Dysphagia, pharyngoesophageal phase: Secondary | ICD-10-CM | POA: Diagnosis not present

## 2021-07-30 DIAGNOSIS — K219 Gastro-esophageal reflux disease without esophagitis: Secondary | ICD-10-CM | POA: Diagnosis not present

## 2021-07-31 DIAGNOSIS — D3701 Neoplasm of uncertain behavior of lip: Secondary | ICD-10-CM | POA: Diagnosis not present

## 2021-08-05 ENCOUNTER — Encounter: Payer: Self-pay | Admitting: Otolaryngology

## 2021-08-06 ENCOUNTER — Other Ambulatory Visit: Payer: Self-pay

## 2021-08-06 ENCOUNTER — Inpatient Hospital Stay: Payer: Medicare Other | Attending: Oncology

## 2021-08-06 DIAGNOSIS — C9211 Chronic myeloid leukemia, BCR/ABL-positive, in remission: Secondary | ICD-10-CM | POA: Insufficient documentation

## 2021-08-06 LAB — CBC WITH DIFFERENTIAL/PLATELET
Abs Immature Granulocytes: 0.01 10*3/uL (ref 0.00–0.07)
Basophils Absolute: 0 10*3/uL (ref 0.0–0.1)
Basophils Relative: 1 %
Eosinophils Absolute: 0.2 10*3/uL (ref 0.0–0.5)
Eosinophils Relative: 6 %
HCT: 31.2 % — ABNORMAL LOW (ref 36.0–46.0)
Hemoglobin: 10.8 g/dL — ABNORMAL LOW (ref 12.0–15.0)
Immature Granulocytes: 0 %
Lymphocytes Relative: 39 %
Lymphs Abs: 1.7 10*3/uL (ref 0.7–4.0)
MCH: 32.4 pg (ref 26.0–34.0)
MCHC: 34.6 g/dL (ref 30.0–36.0)
MCV: 93.7 fL (ref 80.0–100.0)
Monocytes Absolute: 0.3 10*3/uL (ref 0.1–1.0)
Monocytes Relative: 7 %
Neutro Abs: 2.1 10*3/uL (ref 1.7–7.7)
Neutrophils Relative %: 47 %
Platelets: 185 10*3/uL (ref 150–400)
RBC: 3.33 MIL/uL — ABNORMAL LOW (ref 3.87–5.11)
RDW: 12.8 % (ref 11.5–15.5)
WBC: 4.3 10*3/uL (ref 4.0–10.5)
nRBC: 0 % (ref 0.0–0.2)

## 2021-08-13 ENCOUNTER — Ambulatory Visit
Admission: RE | Admit: 2021-08-13 | Discharge: 2021-08-13 | Disposition: A | Discharge status: Home / Self Care | Payer: Medicare Other | Source: Ambulatory Visit | Attending: Otolaryngology | Admitting: Otolaryngology

## 2021-08-13 ENCOUNTER — Other Ambulatory Visit: Payer: Self-pay

## 2021-08-13 ENCOUNTER — Encounter
Admission: RE | Disposition: A | Discharge status: Home / Self Care | Payer: Self-pay | Source: Ambulatory Visit | Attending: Otolaryngology

## 2021-08-13 ENCOUNTER — Ambulatory Visit: Payer: Medicare Other | Admitting: Anesthesiology

## 2021-08-13 ENCOUNTER — Encounter: Payer: Self-pay | Admitting: Otolaryngology

## 2021-08-13 DIAGNOSIS — R220 Localized swelling, mass and lump, head: Secondary | ICD-10-CM | POA: Diagnosis not present

## 2021-08-13 DIAGNOSIS — Z79899 Other long term (current) drug therapy: Secondary | ICD-10-CM | POA: Diagnosis not present

## 2021-08-13 DIAGNOSIS — R682 Dry mouth, unspecified: Secondary | ICD-10-CM | POA: Insufficient documentation

## 2021-08-13 DIAGNOSIS — D100 Benign neoplasm of lip: Secondary | ICD-10-CM | POA: Diagnosis not present

## 2021-08-13 DIAGNOSIS — F419 Anxiety disorder, unspecified: Secondary | ICD-10-CM | POA: Diagnosis not present

## 2021-08-13 DIAGNOSIS — C9210 Chronic myeloid leukemia, BCR/ABL-positive, not having achieved remission: Secondary | ICD-10-CM | POA: Insufficient documentation

## 2021-08-13 DIAGNOSIS — I10 Essential (primary) hypertension: Secondary | ICD-10-CM | POA: Insufficient documentation

## 2021-08-13 DIAGNOSIS — K219 Gastro-esophageal reflux disease without esophagitis: Secondary | ICD-10-CM | POA: Insufficient documentation

## 2021-08-13 DIAGNOSIS — K1379 Other lesions of oral mucosa: Secondary | ICD-10-CM | POA: Insufficient documentation

## 2021-08-13 DIAGNOSIS — K130 Diseases of lips: Secondary | ICD-10-CM | POA: Diagnosis not present

## 2021-08-13 HISTORY — PX: MASS EXCISION: SHX2000

## 2021-08-13 LAB — GLUCOSE, CAPILLARY
Glucose-Capillary: 105 mg/dL — ABNORMAL HIGH (ref 70–99)
Glucose-Capillary: 107 mg/dL — ABNORMAL HIGH (ref 70–99)

## 2021-08-13 SURGERY — EXCISION MASS
Anesthesia: General | Site: Mouth | Laterality: Right

## 2021-08-13 MED ORDER — PROMETHAZINE HCL 25 MG/ML IJ SOLN: 6.2500 mg | INTRAMUSCULAR | Status: DC | PRN

## 2021-08-13 MED ORDER — DEXAMETHASONE SODIUM PHOSPHATE 4 MG/ML IJ SOLN
INTRAMUSCULAR | Status: DC | PRN
  Administered 2021-08-13: 8 mg via INTRAVENOUS

## 2021-08-13 MED ORDER — HYDROCODONE-ACETAMINOPHEN 5-325 MG PO TABS: 1.0000 | ORAL_TABLET | Freq: Four times a day (QID) | ORAL | 0 refills | Status: DC | PRN

## 2021-08-13 MED ORDER — LACTATED RINGERS IV SOLN: INTRAVENOUS | Status: DC

## 2021-08-13 MED ORDER — OXYCODONE HCL 5 MG PO TABS: 5.0000 mg | ORAL_TABLET | Freq: Once | ORAL | Status: DC | PRN

## 2021-08-13 MED ORDER — LIDOCAINE-EPINEPHRINE 1 %-1:100000 IJ SOLN
INTRAMUSCULAR | Status: DC | PRN
  Administered 2021-08-13: 1 mL

## 2021-08-13 MED ORDER — PROPOFOL 10 MG/ML IV BOLUS
INTRAVENOUS | Status: DC | PRN
  Administered 2021-08-13: 130 mg via INTRAVENOUS

## 2021-08-13 MED ORDER — ONDANSETRON HCL 4 MG/2ML IJ SOLN
INTRAMUSCULAR | Status: DC | PRN
  Administered 2021-08-13: 4 mg via INTRAVENOUS

## 2021-08-13 MED ORDER — OXYCODONE HCL 5 MG/5ML PO SOLN: 5.0000 mg | Freq: Once | ORAL | Status: DC | PRN

## 2021-08-13 MED ORDER — FENTANYL CITRATE (PF) 100 MCG/2ML IJ SOLN
INTRAMUSCULAR | Status: DC | PRN
  Administered 2021-08-13: 50 ug via INTRAVENOUS

## 2021-08-13 MED ORDER — GLYCOPYRROLATE 0.2 MG/ML IJ SOLN
INTRAMUSCULAR | Status: DC | PRN
  Administered 2021-08-13: .1 mg via INTRAVENOUS

## 2021-08-13 MED ORDER — HYDROMORPHONE HCL 1 MG/ML IJ SOLN: 0.2500 mg | INTRAMUSCULAR | Status: DC | PRN

## 2021-08-13 MED ORDER — LIDOCAINE HCL (CARDIAC) PF 100 MG/5ML IV SOSY
PREFILLED_SYRINGE | INTRAVENOUS | Status: DC | PRN
  Administered 2021-08-13: 50 mg via INTRATRACHEAL

## 2021-08-13 MED ORDER — MIDAZOLAM HCL 5 MG/5ML IJ SOLN
INTRAMUSCULAR | Status: DC | PRN
  Administered 2021-08-13: 1 mg via INTRAVENOUS

## 2021-08-13 SURGICAL SUPPLY — 20 items
CORD BIP STRL DISP 12FT (MISCELLANEOUS) IMPLANT
GAUZE SPONGE 4X4 12PLY STRL (GAUZE/BANDAGES/DRESSINGS) ×2 IMPLANT
GLOVE SURG ENC MOIS LTX SZ7.5 (GLOVE) ×6 IMPLANT
GOWN STRL REUS W/ TWL LRG LVL3 (GOWN DISPOSABLE) ×1 IMPLANT
GOWN STRL REUS W/TWL LRG LVL3 (GOWN DISPOSABLE) ×3
KIT TURNOVER KIT A (KITS) ×3 IMPLANT
NDL HYPO 25GX1X1/2 BEV (NEEDLE) ×1 IMPLANT
NEEDLE HYPO 25GX1X1/2 BEV (NEEDLE) ×3 IMPLANT
PACK ENT CUSTOM (PACKS) ×3 IMPLANT
STRAP BODY AND KNEE 60X3 (MISCELLANEOUS) ×3 IMPLANT
SUCTION FRAZIER HANDLE 10FR (MISCELLANEOUS)
SUCTION TUBE FRAZIER 10FR DISP (MISCELLANEOUS) IMPLANT
SUT CHROMIC 5 0 RB 1 27 (SUTURE) ×2 IMPLANT
SUT PLAIN GUT FAST 5-0 (SUTURE) IMPLANT
SUT PROLENE 5 0 P 3 (SUTURE) IMPLANT
SUT VIC AB 4-0 RB1 27 (SUTURE)
SUT VIC AB 4-0 RB1 27X BRD (SUTURE) IMPLANT
SUT VIC AB 5-0 P-3 18X BRD (SUTURE) IMPLANT
SUT VIC AB 5-0 P3 18 (SUTURE) ×3
SYR 10ML LL (SYRINGE) ×3 IMPLANT

## 2021-08-13 NOTE — Op Note (Signed)
08/13/2021  8:37 AM    Nicoletta Dress [place] [facility]  [id number]   Pre-Op Diagnosis:  right lower lip mass  Post-op Diagnosis: SAME  Procedure: Excision right lower lip mass with closure (1.5 cm)  Surgeon: Riley Nearing., MD  Anesthesia:  General with laryngeal mask airway  EBL:  Less than 5 cc  Complications:  None  Findings: Indistinct right lower lip mass, submucosal, maybe 8 mm in greatest dimension  Procedure: The patient was taken to the Operating Room and placed in the supine position.  After induction of general anesthesia with a laryngeal mask airway, the patient was draped in the usual fashion with the eyes protected.  An [employer] retractor inserted into the oral cavity to open the mouth, and examination of the right lower lip revealed a subcu 2 submucosal palpable mass.  1% lidocaine with epinephrine 1-100,000 was injected over the mass.  The mucosa was incised with a 15 blade and scissors used to dissect around the mass, taking a cuff of adipose tissue.  The mass was somewhat indistinct but palpable and was dissected out completely.  It was sent as a specimen.  Bleeding was minimal.  The submucosal tissues were closed with 5-0 Vicryl suture.  Mucosa was closed with 5-0 Chromic Gut suture in a running locked stitch.  The patient was then returned to the anesthesiologist for awakening, and was taken to the Recovery Room in stable condition.  Cultures:  None.  Specimens: Right lower lip mass  Disposition:   PACU to home  Plan: Softdiet and push fluids. Take pain medications as prescribed.  Follow-up in 1 week.   Riley Nearing 08/13/2021 8:37 AM

## 2021-08-13 NOTE — H&P (Signed)
History and physical reviewed and will be scanned in later. No change in medical status reported by the patient or family, appears stable for surgery. All questions regarding the procedure answered, and patient (or family if a child) expressed understanding of the procedure. ? ?Jenna Stein S Mel Tadros ?@TODAY@ ?

## 2021-08-13 NOTE — Anesthesia Procedure Notes (Signed)
Procedure Name: LMA Insertion Date/Time: 08/13/2021 7:50 AM Performed by: Mayme Genta, CRNA Pre-anesthesia Checklist: Patient identified, Emergency Drugs available, Suction available, Timeout performed and Patient being monitored Patient Re-evaluated:Patient Re-evaluated prior to induction Oxygen Delivery Method: Circle system utilized Preoxygenation: Pre-oxygenation with 100% oxygen Induction Type: IV induction LMA: LMA inserted LMA Size: 4.0 Number of attempts: 1 Placement Confirmation: positive ETCO2 and breath sounds checked- equal and bilateral Tube secured with: Tape

## 2021-08-13 NOTE — Transfer of Care (Signed)
Immediate Anesthesia Transfer of Care Note  Patient: Jenna Stein  Procedure(s) Performed: EXCISION LOWER LIP MASS (Right: Mouth)  Patient Location: PACU  Anesthesia Type: General  Level of Consciousness: awake, alert  and patient cooperative  Airway and Oxygen Therapy: Patient Spontanous Breathing and Patient connected to supplemental oxygen  Post-op Assessment: Post-op Vital signs reviewed, Patient's Cardiovascular Status Stable, Respiratory Function Stable, Patent Airway and No signs of Nausea or vomiting  Post-op Vital Signs: Reviewed and stable  Complications: No notable events documented.

## 2021-08-13 NOTE — Anesthesia Preprocedure Evaluation (Addendum)
Anesthesia Evaluation  Patient identified by MRN, date of birth, ID band Patient awake    Reviewed: Allergy & Precautions, H&P , NPO status , Patient's Chart, lab work & pertinent test results, reviewed documented beta blocker date and time   Airway Mallampati: III  TM Distance: <3 FB Neck ROM: full    Dental no notable dental hx.    Pulmonary neg pulmonary ROS,    Pulmonary exam normal breath sounds clear to auscultation       Cardiovascular Exercise Tolerance: Good hypertension, negative cardio ROS   Rhythm:regular Rate:Normal     Neuro/Psych Anxiety negative neurological ROS  negative psych ROS   GI/Hepatic Neg liver ROS, GERD  ,  Endo/Other  negative endocrine ROSdiabetes  Renal/GU negative Renal ROS  negative genitourinary   Musculoskeletal  (+) Arthritis ,   Abdominal   Peds  Hematology  (+) Blood dyscrasia (CML - on Gleevec), anemia ,   Anesthesia Other Findings   Reproductive/Obstetrics negative OB ROS                            Anesthesia Physical Anesthesia Plan  ASA: 2  Anesthesia Plan: General   Post-op Pain Management:    Induction:   PONV Risk Score and Plan: 3 and Dexamethasone, Ondansetron and Treatment may vary due to age or medical condition  Airway Management Planned:   Additional Equipment:   Intra-op Plan:   Post-operative Plan:   Informed Consent: I have reviewed the patients History and Physical, chart, labs and discussed the procedure including the risks, benefits and alternatives for the proposed anesthesia with the patient or authorized representative who has indicated his/her understanding and acceptance.     Dental Advisory Given  Plan Discussed with: CRNA  Anesthesia Plan Comments:         Anesthesia Quick Evaluation

## 2021-08-13 NOTE — Anesthesia Postprocedure Evaluation (Signed)
Anesthesia Post Note  Patient: Jenna Stein  Procedure(s) Performed: EXCISION LOWER LIP MASS (Right: Mouth)     Patient location during evaluation: PACU Anesthesia Type: General Level of consciousness: awake and alert Pain management: pain level controlled Vital Signs Assessment: post-procedure vital signs reviewed and stable Respiratory status: spontaneous breathing, nonlabored ventilation and respiratory function stable Cardiovascular status: blood pressure returned to baseline and stable Postop Assessment: no apparent nausea or vomiting Anesthetic complications: no   No notable events documented.  April Manson

## 2021-08-14 LAB — SURGICAL PATHOLOGY

## 2021-09-02 ENCOUNTER — Telehealth: Payer: Self-pay | Admitting: Oncology

## 2021-09-02 NOTE — Telephone Encounter (Addendum)
She only has labs on 2/8. Ok to move 1-2 days out from 2/8 and keep MD as scheduled.

## 2021-09-02 NOTE — Telephone Encounter (Signed)
Pt called to reschedule her appt on 2-8. Call back at [phone]

## 2021-09-11 ENCOUNTER — Ambulatory Visit (INDEPENDENT_AMBULATORY_CARE_PROVIDER_SITE_OTHER): Payer: Medicare Other

## 2021-09-11 DIAGNOSIS — Z0000 Encounter for general adult medical examination without abnormal findings: Secondary | ICD-10-CM

## 2021-09-11 NOTE — Progress Notes (Signed)
Subjective:   Jenna Stein is a 71 y.o. female who presents for Medicare Annual (Subsequent) preventive examination.  Virtual Visit via Telephone Note  I connected with  Jenna Stein on 09/11/21 at 10:40 AM EST by telephone and verified that I am speaking with the correct person using two identifiers.  Location: Patient: home Provider: [facility] Persons participating in the virtual visit: [place]   I discussed the limitations, risks, security and privacy concerns of performing an evaluation and management service by telephone and the availability of in person appointments. The patient expressed understanding and agreed to proceed.  Interactive audio and video telecommunications were attempted between this nurse and patient, however failed, due to patient having technical difficulties OR patient did not have access to video capability.  We continued and completed visit with audio only.  Some vital signs may be absent or patient reported.   [name], LPN   Review of Systems     Cardiac Risk Factors include: advanced age (>62men, >61 women);diabetes mellitus;dyslipidemia;hypertension     Objective:    There were no vitals filed for this visit. There is no height or weight on file to calculate BMI.  Advanced Directives 09/11/2021 08/13/2021 06/25/2021 03/07/2021 01/31/2021 01/03/2021 11/19/2020  Does Patient Have a Medical Advance Directive? Yes Yes Yes Yes Yes Yes Yes  Type of Paramedic of [place];Living will [place];Living will Living will;Healthcare Power of [place];Living will [place];Living will Living will Living will  Does patient want to make changes to medical advance directive? - No - Patient declined - Yes (ED - Information included in AVS) No - Patient declined - -  Copy of [place] in Chart? Yes - validated most recent copy scanned in chart  (See row information) No - copy requested - - - - -    Current Medications (verified) Outpatient Encounter Medications as of 09/11/2021  Medication Sig   ACETYLCYSTEINE, NUTRIENT, PO Take by mouth.   acyclovir (ZOVIRAX) 200 MG capsule Take 1 capsule (200 mg total) by mouth 5 (five) times daily.   atenolol (TENORMIN) 50 MG tablet Take 1 tablet (50 mg total) by mouth daily.   atorvastatin (LIPITOR) 10 MG tablet Take 1 tablet (10 mg total) by mouth at bedtime.   Coenzyme Q10 (CO Q 10) 100 MG CAPS Take 2 capsules by mouth daily.    diphenhydrAMINE (BENADRYL ALLERGY) 25 MG tablet Take 1 tablet (25 mg total) by mouth every 8 (eight) hours as needed.   estazolam (PROSOM) 2 MG tablet TAKE 1 TABLET BY MOUTH AT BEDTIME.   glucose blood (FREESTYLE LITE) test strip Use to test BS twice daily   hydrochlorothiazide (HYDRODIURIL) 25 MG tablet Take 1 tablet (25 mg total) by mouth daily.   hydrocortisone cream 0.5 % Apply 1 application topically 2 (two) times daily. Apply to periorbital area twice daily.   imatinib (GLEEVEC) 400 MG tablet Take 1 tablet (400 mg total) by mouth daily. Take with meals and large glass of water.Caution:Chemotherapy.   Lancets (FREESTYLE) lancets Use to test BS once daily   loperamide (IMODIUM) 2 MG capsule Take 1 capsule (2 mg total) by mouth See admin instructions. Initial: 4 mg, followed by 2 mg after each loose stool; maximum: 16 mg/day   Lutein 20 MG TABS Take 1 tablet by mouth daily.    metFORMIN (GLUCOPHAGE) 1000 MG tablet Take 1 tablet (1,000 mg total) by mouth 2 (two)  times daily with a meal.   MULTIPLE VITAMIN PO 1 tablet daily.    olopatadine (PATANOL) 0.1 % ophthalmic solution Apply to eye.   Omega-3 Fatty Acids (FISH OIL PEARLS PO) Take by mouth.   omeprazole (PRILOSEC) 20 MG capsule Take 1 capsule (20 mg total) by mouth daily.   PARoxetine (PAXIL) 20 MG tablet Take 1 tablet (20 mg total) by mouth daily.   potassium chloride SA (KLOR-CON) 20 MEQ tablet Take 1 tablet  by mouth daily.   SELENIUM PO Take by mouth daily.   sitaGLIPtin (JANUVIA) 100 MG tablet Take 1 tablet (100 mg total) by mouth daily.   triamcinolone cream (KENALOG) 0.5 % APPLY 1 APPLICATION TOPICALLY 3 (THREE) TIMES DAILY. TO RASH ON ABDOMEN   TURMERIC PO Take 800 mg by mouth.   valsartan (DIOVAN) 320 MG tablet Take 1 tablet (320 mg total) by mouth daily.   Vitamin D, Ergocalciferol, (DRISDOL) 1.25 MG (50000 UNIT) CAPS capsule Take 50,000 Units by mouth every 7 (seven) days.   vitamin E (VITAMIN E) 400 UNIT capsule Take 1 capsule (400 Units total) by mouth daily.   zinc gluconate 50 MG tablet Take 50 mg by mouth daily.   [DISCONTINUED] celecoxib (CELEBREX) 200 MG capsule TAKE 1 CAPSULE BY MOUTH EVERY DAY   [DISCONTINUED] HYDROcodone-acetaminophen (NORCO/VICODIN) 5-325 MG tablet Take 1-2 tablets by mouth every 6 (six) hours as needed for moderate pain.   [DISCONTINUED] PATADAY 0.2 % SOLN Instill 1 drop into both eyes once a day   No facility-administered encounter medications on file as of 09/11/2021.    Allergies (verified) Clonazepam and Trazodone   History: Past Medical History:  Diagnosis Date   Allergy    Anxiety    Arthritis 2010   Chronic myeloid leukemia, BCR/ABL-positive, not having achieved remission ([place]) 11/17/2020   Depression    Diabetes mellitus without complication ([place])    GERD (gastroesophageal reflux disease)    Hyperlipidemia    Hypertension    Past Surgical History:  Procedure Laterality Date   CYST EXCISION Left 2017   foot   EYE SURGERY  11/16/1996   myopia laser surgery   HAND SURGERY  07/2012   MASS EXCISION Right 08/13/2021   Procedure: EXCISION LOWER LIP MASS;  Surgeon: Clyde Canterbury, MD;  Location: [facility];  Service: ENT;  Laterality: Right;  Diabetic   Family History  Problem Relation Age of Onset   Hypertension Mother    Hyperlipidemia Mother    Arthritis Mother    Parkinsonism Father    Arthritis Other    Hypertension Other     Hyperlipidemia Other    Hypothyroidism Other    Breast cancer Sister 34   Cancer Sister    Social History   Socioeconomic History   Marital status: Married    Spouse name: Not on file   Number of children: 1   Years of education: Not on file   Highest education level: Bachelor's degree (e.g., BA, AB, BS)  Occupational History   Occupation: Retired  Tobacco Use   Smoking status: Never   Smokeless tobacco: Never   Tobacco comments:    smoking cessation materials not required  Vaping Use   Vaping Use: Never used  Substance and Sexual Activity   Alcohol use: No   Drug use: No   Sexual activity: Not Currently    Birth control/protection: None  Other Topics Concern   Not on file  Social History Narrative   Not on file   Social  Determinants of Health   Financial Resource Strain: Low Risk    Difficulty of Paying Living Expenses: Not hard at all  Food Insecurity: No Food Insecurity   Worried About [place] in the Last Year: Never true   Ran Out of Food in the Last Year: Never true  Transportation Needs: No Transportation Needs   Lack of Transportation (Medical): No   Lack of Transportation (Non-Medical): No  Physical Activity: Insufficiently Active   Days of Exercise per Week: 4 days   Minutes of Exercise per Session: 30 min  Stress: No Stress Concern Present   Feeling of Stress : Not at all  Social Connections: Moderately Isolated   Frequency of Communication with Friends and Family: More than three times a week   Frequency of Social Gatherings with Friends and Family: Once a week   Attends Religious Services: Never   Marine scientist or Organizations: No   Attends Music therapist: Never   Marital Status: Married    Tobacco Counseling Counseling given: Not Answered Tobacco comments: smoking cessation materials not required   Clinical Intake:  Pre-visit preparation completed: Yes  Pain : No/denies pain     Nutritional  Risks: Nausea/ vomitting/ diarrhea (diarrhea due to medication side effects) Diabetes: Yes CBG done?: No Did pt. bring in CBG monitor from home?: No  How often do you need to have someone help you when you read instructions, pamphlets, or other written materials from your doctor or pharmacy?: 1 - Never  Nutrition Risk Assessment:  Has the patient had any N/V/D within the last 2 months?  Yes  Does the patient have any non-healing wounds?  No  Has the patient had any unintentional weight loss or weight gain?  No   Diabetes:  Is the patient diabetic?  Yes  If diabetic, was a CBG obtained today?  No  Did the patient bring in their glucometer from home?  No  How often do you monitor your CBG's? daily.   Financial Strains and Diabetes Management:  Are you having any financial strains with the device, your supplies or your medication? No .  Does the patient want to be seen by Chronic Care Management for management of their diabetes?  No  Would the patient like to be referred to a Nutritionist or for Diabetic Management?  No   Diabetic Exams:  Diabetic Eye Exam: Completed 12/10/20 negative retinopathy.   Diabetic Foot Exam: Completed 06/04/20. Pt has been advised about the importance in completing this exam. Pt is scheduled for diabetic foot exam on 10/11/21.    Interpreter Needed?: No  Information entered by :: [name] LPN   Activities of Daily Living In your present state of health, do you have any difficulty performing the following activities: 09/11/2021 08/13/2021  Hearing? N N  Vision? N N  Difficulty concentrating or making decisions? N N  Walking or climbing stairs? N N  Dressing or bathing? N N  Doing errands, shopping? N -  Preparing Food and eating ? N -  Using the Toilet? N -  In the past six months, have you accidently leaked urine? N -  Do you have problems with loss of bowel control? N -  Managing your Medications? N -  Managing your Finances? N -   Housekeeping or managing your Housekeeping? N -  Some recent data might be hidden    Patient Care Team: Glean Hess, MD as PCP - General (Internal Medicine) [facility]  Center (Ophthalmology) Earlie Server, MD as Consulting Physician (Oncology) Clyde Canterbury, MD as Referring Physician (Otolaryngology) [facility] (Gastroenterology)  Indicate any recent Medical Services you may have received from other than [facility] providers in the past year (date may be approximate).     Assessment:   This is a routine wellness examination for Jenna Stein.  Hearing/Vision screen Hearing Screening - Comments:: Pt denies hearing difficulty Vision Screening - Comments:: Annual vision screenings [facility] at [facility]  Dietary issues and exercise activities discussed: Current Exercise Habits: Home exercise routine, Type of exercise: calisthenics, Time (Minutes): 30, Frequency (Times/Week): 4, Weekly Exercise (Minutes/Week): 120, Intensity: Moderate, Exercise limited by: None identified   Goals Addressed             This Visit's Progress    DIET - INCREASE WATER INTAKE   On track    Recommend to drink at least 6-8 8oz glasses of water per day.       Depression Screen PHQ 2/9 Scores 09/11/2021 05/24/2021 01/25/2021 10/09/2020 09/10/2020 06/04/2020 02/01/2020  PHQ - 2 Score 0 1 2 0 0 0 0  PHQ- 9 Score - 3 3 0 - 0 0    Fall Risk Fall Risk  09/11/2021 05/24/2021 01/25/2021 10/09/2020 09/10/2020  Falls in the past year? 0 0 0 0 0  Number falls in past yr: 0 0 - - 0  Injury with Fall? 0 0 - - 0  Risk for fall due to : No Fall Risks No Fall Risks - - History of fall(s)  Risk for fall due to: Comment - - - - -  Follow up Falls prevention discussed Falls evaluation completed Falls evaluation completed Falls evaluation completed Falls prevention discussed    FALL RISK PREVENTION PERTAINING TO THE HOME:  Any stairs in or around the home? Yes  If so, are there any without handrails? Yes  -  few steps outside Home free of loose throw rugs in walkways, pet beds, electrical cords, etc? Yes  Adequate lighting in your home to reduce risk of falls? Yes   ASSISTIVE DEVICES UTILIZED TO PREVENT FALLS:  Life alert? No  Use of a cane, walker or w/c? No  Grab bars in the bathroom? Yes  Shower chair or bench in shower? Yes  Elevated toilet seat or a handicapped toilet? No   TIMED UP AND GO:  Was the test performed? No . Telephonic visit  Cognitive Function: Normal cognitive status assessed by direct observation by this Nurse Health Advisor. No abnormalities found.       6CIT Screen 03/08/2018 02/16/2017  What Year? 0 points 0 points  What month? 0 points 0 points  What time? 0 points 0 points  Count back from 20 0 points 0 points  Months in reverse 2 points 0 points  Repeat phrase 0 points 0 points  Total Score 2 0    Immunizations Immunization History  Administered Date(s) Administered   Fluad Quad(high Dose 65+) 05/24/2021   Influenza Split 04/18/2011, 05/24/2012   Influenza, High Dose Seasonal PF 07/03/2017, 04/29/2018   Influenza,inj,Quad PF,6+ Mos 05/02/2013, 05/03/2014, 05/09/2015   Influenza,inj,quad, With Preservative 07/18/2016   Influenza-Unspecified 04/18/2017, 05/03/2018, 05/18/2020   PFIZER(Purple Top)SARS-COV-2 Vaccination 09/27/2019, 10/18/2019, 04/18/2020   Pneumococcal Conjugate-13 10/10/2016   Pneumococcal Polysaccharide-23 08/07/2017   Td 08/04/2003   Tdap 08/04/2003, 04/18/2011   Zoster Recombinat (Shingrix) 04/29/2018, 07/29/2018   Zoster, Live 01/12/2013    TDAP status: Due, Education has been provided regarding the importance of this  vaccine. Advised may receive this vaccine at local pharmacy or Health Dept. Aware to provide a copy of the vaccination record if obtained from local pharmacy or Health Dept. Verbalized acceptance and understanding.  Flu Vaccine status: Up to date  Pneumococcal vaccine status: Up to date  Covid-19 vaccine status:  Completed vaccines  Qualifies for Shingles Vaccine? Yes   Zostavax completed Yes   Shingrix Completed?: Yes  Screening Tests Health Maintenance  Topic Date Due   COVID-19 Vaccine (4 - Booster for Pfizer series) 06/13/2020   TETANUS/TDAP  04/17/2021   FOOT EXAM  06/04/2021   COLONOSCOPY (Pts 45-27yrs Insurance coverage will need to be confirmed)  07/22/2021   HEMOGLOBIN A1C  11/22/2021   MAMMOGRAM  12/06/2021   OPHTHALMOLOGY EXAM  12/10/2021   Pneumonia Vaccine 33+ Years old  Completed   INFLUENZA VACCINE  Completed   DEXA SCAN  Completed   Hepatitis C Screening  Completed   Zoster Vaccines- Shingrix  Completed   HPV VACCINES  Aged Out    Health Maintenance  Health Maintenance Due  Topic Date Due   COVID-19 Vaccine (4 - Booster for [place] series) 06/13/2020   TETANUS/TDAP  04/17/2021   FOOT EXAM  06/04/2021   COLONOSCOPY (Pts 45-53yrs Insurance coverage will need to be confirmed)  07/22/2021    Colorectal cancer screening: Type of screening: Colonoscopy. Completed 07/23/11. Repeat every 10 years. Scheduled for 09/17/21  Mammogram status: Completed 12/06/20. Repeat every year  Bone Density status: Completed 12/06/20. Results reflect: Bone density results: OSTEOPENIA. Repeat every 2 years.  Lung Cancer Screening: (Low Dose CT Chest recommended if Age 3-80 years, 30 pack-year currently smoking OR have quit w/in 15years.) does not qualify.   Additional Screening:  Hepatitis C Screening: does qualify; Completed 12/26/15  Vision Screening: Recommended annual ophthalmology exams for early detection of glaucoma and other disorders of the eye. Is the patient up to date with their annual eye exam?  Yes  Who is the provider or what is the name of the office in which the patient attends annual eye exams? [facility].   Dental Screening: Recommended annual dental exams for proper oral hygiene  Community Resource Referral / Chronic Care Management: CRR required this visit?   No   CCM required this visit?  No      Plan:     I have personally reviewed and noted the following in the patients chart:   Medical and social history Use of alcohol, tobacco or illicit drugs  Current medications and supplements including opioid prescriptions.  Functional ability and status Nutritional status Physical activity Advanced directives List of other physicians Hospitalizations, surgeries, and ER visits in previous 12 months Vitals Screenings to include cognitive, depression, and falls Referrals and appointments  In addition, I have reviewed and discussed with patient certain preventive protocols, quality metrics, and best practice recommendations. A written personalized care plan for preventive services as well as general preventive health recommendations were provided to patient.   Due to this being a telephonic visit, the after visit summary with patients personalized plan was offered to patient via my-chart.   [name], LPN   [id number]   Nurse Notes: none

## 2021-09-11 NOTE — Patient Instructions (Signed)
Jenna Stein , Thank you for taking time to come for your Medicare Wellness Visit. I appreciate your ongoing commitment to your health goals. Please review the following plan we discussed and let me know if I can assist you in the future.   Screening recommendations/referrals: Colonoscopy: done 07/23/11. Scheduled for 09/17/21 Mammogram: done 12/06/20 Bone Density: done 12/06/20 Recommended yearly ophthalmology/optometry visit for glaucoma screening and checkup Recommended yearly dental visit for hygiene and checkup  Vaccinations: Influenza vaccine: done 05/24/21 Pneumococcal vaccine: done 08/07/17 Tdap vaccine: due Shingles vaccine: done 04/29/18 & 07/29/18   Covid-19:done 09/27/19, 10/18/19 & 04/18/20  Conditions/risks identified: Keep up the great work!  Next appointment: Follow up in one year for your annual wellness visit    Preventive Care 65 Years and Older, Female Preventive care refers to lifestyle choices and visits with your health care provider that can promote health and wellness. What does preventive care include? A yearly physical exam. This is also called an annual well check. Dental exams once or twice a year. Routine eye exams. Ask your health care provider how often you should have your eyes checked. Personal lifestyle choices, including: Daily care of your teeth and gums. Regular physical activity. Eating a healthy diet. Avoiding tobacco and drug use. Limiting alcohol use. Practicing safe sex. Taking low-dose aspirin every day. Taking vitamin and mineral supplements as recommended by your health care provider. What happens during an annual well check? The services and screenings done by your health care provider during your annual well check will depend on your age, overall health, lifestyle risk factors, and family history of disease. Counseling  Your health care provider may ask you questions about your: Alcohol use. Tobacco use. Drug use. Emotional  well-being. Home and relationship well-being. Sexual activity. Eating habits. History of falls. Memory and ability to understand (cognition). Work and work Statistician. Reproductive health. Screening  You may have the following tests or measurements: Height, weight, and BMI. Blood pressure. Lipid and cholesterol levels. These may be checked every 5 years, or more frequently if you are over 50 years old. Skin check. Lung cancer screening. You may have this screening every year starting at age 56 if you have a 30-pack-year history of smoking and currently smoke or have quit within the past 15 years. Fecal occult blood test (FOBT) of the stool. You may have this test every year starting at age 74. Flexible sigmoidoscopy or colonoscopy. You may have a sigmoidoscopy every 5 years or a colonoscopy every 10 years starting at age 46. Hepatitis C blood test. Hepatitis B blood test. Sexually transmitted disease (STD) testing. Diabetes screening. This is done by checking your blood sugar (glucose) after you have not eaten for a while (fasting). You may have this done every 1-3 years. Bone density scan. This is done to screen for osteoporosis. You may have this done starting at age 41. Mammogram. This may be done every 1-2 years. Talk to your health care provider about how often you should have regular mammograms. Talk with your health care provider about your test results, treatment options, and if necessary, the need for more tests. Vaccines  Your health care provider may recommend certain vaccines, such as: Influenza vaccine. This is recommended every year. Tetanus, diphtheria, and acellular pertussis (Tdap, Td) vaccine. You may need a Td booster every 10 years. Zoster vaccine. You may need this after age 66. Pneumococcal 13-valent conjugate (PCV13) vaccine. One dose is recommended after age 81. Pneumococcal polysaccharide (PPSV23) vaccine. One dose is recommended  after age 21. Talk to your  health care provider about which screenings and vaccines you need and how often you need them. This information is not intended to replace advice given to you by your health care provider. Make sure you discuss any questions you have with your health care provider. Document Released: 08/31/2015 Document Revised: 04/23/2016 Document Reviewed: 06/05/2015 Elsevier Interactive Patient Education  2017 [place] Prevention in the Home Falls can cause injuries. They can happen to people of all ages. There are many things you can do to make your home safe and to help prevent falls. What can I do on the outside of my home? Regularly fix the edges of walkways and driveways and fix any cracks. Remove anything that might make you trip as you walk through a door, such as a raised step or threshold. Trim any bushes or trees on the path to your home. Use bright outdoor lighting. Clear any walking paths of anything that might make someone trip, such as rocks or tools. Regularly check to see if handrails are loose or broken. Make sure that both sides of any steps have handrails. Any raised decks and porches should have guardrails on the edges. Have any leaves, snow, or ice cleared regularly. Use sand or salt on walking paths during winter. Clean up any spills in your garage right away. This includes oil or grease spills. What can I do in the bathroom? Use night lights. Install grab bars by the toilet and in the tub and shower. Do not use towel bars as grab bars. Use non-skid mats or decals in the tub or shower. If you need to sit down in the shower, use a plastic, non-slip stool. Keep the floor dry. Clean up any water that spills on the floor as soon as it happens. Remove soap buildup in the tub or shower regularly. Attach bath mats securely with double-sided non-slip rug tape. Do not have throw rugs and other things on the floor that can make you trip. What can I do in the bedroom? Use night  lights. Make sure that you have a light by your bed that is easy to reach. Do not use any sheets or blankets that are too big for your bed. They should not hang down onto the floor. Have a firm chair that has side arms. You can use this for support while you get dressed. Do not have throw rugs and other things on the floor that can make you trip. What can I do in the kitchen? Clean up any spills right away. Avoid walking on wet floors. Keep items that you use a lot in easy-to-reach places. If you need to reach something above you, use a strong step stool that has a grab bar. Keep electrical cords out of the way. Do not use floor polish or wax that makes floors slippery. If you must use wax, use non-skid floor wax. Do not have throw rugs and other things on the floor that can make you trip. What can I do with my stairs? Do not leave any items on the stairs. Make sure that there are handrails on both sides of the stairs and use them. Fix handrails that are broken or loose. Make sure that handrails are as long as the stairways. Check any carpeting to make sure that it is firmly attached to the stairs. Fix any carpet that is loose or worn. Avoid having throw rugs at the top or bottom of the stairs. If you  do have throw rugs, attach them to the floor with carpet tape. Make sure that you have a light switch at the top of the stairs and the bottom of the stairs. If you do not have them, ask someone to add them for you. What else can I do to help prevent falls? Wear shoes that: Do not have high heels. Have rubber bottoms. Are comfortable and fit you well. Are closed at the toe. Do not wear sandals. If you use a stepladder: Make sure that it is fully opened. Do not climb a closed stepladder. Make sure that both sides of the stepladder are locked into place. Ask someone to hold it for you, if possible. Clearly mark and make sure that you can see: Any grab bars or handrails. First and last  steps. Where the edge of each step is. Use tools that help you move around (mobility aids) if they are needed. These include: Canes. Walkers. Scooters. Crutches. Turn on the lights when you go into a dark area. Replace any light bulbs as soon as they burn out. Set up your furniture so you have a clear path. Avoid moving your furniture around. If any of your floors are uneven, fix them. If there are any pets around you, be aware of where they are. Review your medicines with your doctor. Some medicines can make you feel dizzy. This can increase your chance of falling. Ask your doctor what other things that you can do to help prevent falls. This information is not intended to replace advice given to you by your health care provider. Make sure you discuss any questions you have with your health care provider. Document Released: 05/31/2009 Document Revised: 01/10/2016 Document Reviewed: 09/08/2014 Elsevier Interactive Patient Education  2017 [employer].

## 2021-09-17 DIAGNOSIS — K640 First degree hemorrhoids: Secondary | ICD-10-CM | POA: Diagnosis not present

## 2021-09-17 DIAGNOSIS — Z1211 Encounter for screening for malignant neoplasm of colon: Secondary | ICD-10-CM | POA: Diagnosis not present

## 2021-09-17 DIAGNOSIS — R1310 Dysphagia, unspecified: Secondary | ICD-10-CM | POA: Diagnosis not present

## 2021-09-17 DIAGNOSIS — K2100 Gastro-esophageal reflux disease with esophagitis, without bleeding: Secondary | ICD-10-CM | POA: Diagnosis not present

## 2021-09-24 ENCOUNTER — Inpatient Hospital Stay: Payer: Medicare Other | Attending: Oncology

## 2021-09-24 ENCOUNTER — Other Ambulatory Visit: Payer: Self-pay

## 2021-09-24 DIAGNOSIS — C9211 Chronic myeloid leukemia, BCR/ABL-positive, in remission: Secondary | ICD-10-CM | POA: Insufficient documentation

## 2021-09-24 DIAGNOSIS — Z79899 Other long term (current) drug therapy: Secondary | ICD-10-CM | POA: Diagnosis not present

## 2021-09-24 DIAGNOSIS — R197 Diarrhea, unspecified: Secondary | ICD-10-CM | POA: Insufficient documentation

## 2021-09-24 DIAGNOSIS — R220 Localized swelling, mass and lump, head: Secondary | ICD-10-CM | POA: Insufficient documentation

## 2021-09-24 DIAGNOSIS — E876 Hypokalemia: Secondary | ICD-10-CM | POA: Insufficient documentation

## 2021-09-24 DIAGNOSIS — U071 COVID-19: Secondary | ICD-10-CM | POA: Diagnosis not present

## 2021-09-24 DIAGNOSIS — D6481 Anemia due to antineoplastic chemotherapy: Secondary | ICD-10-CM | POA: Insufficient documentation

## 2021-09-24 DIAGNOSIS — T451X5A Adverse effect of antineoplastic and immunosuppressive drugs, initial encounter: Secondary | ICD-10-CM | POA: Diagnosis not present

## 2021-09-24 LAB — CBC WITH DIFFERENTIAL/PLATELET
Abs Immature Granulocytes: 0.01 10*3/uL (ref 0.00–0.07)
Basophils Absolute: 0.1 10*3/uL (ref 0.0–0.1)
Basophils Relative: 1 %
Eosinophils Absolute: 0.3 10*3/uL (ref 0.0–0.5)
Eosinophils Relative: 7 %
HCT: 31.5 % — ABNORMAL LOW (ref 36.0–46.0)
Hemoglobin: 11 g/dL — ABNORMAL LOW (ref 12.0–15.0)
Immature Granulocytes: 0 %
Lymphocytes Relative: 44 %
Lymphs Abs: 1.8 10*3/uL (ref 0.7–4.0)
MCH: 33 pg (ref 26.0–34.0)
MCHC: 34.9 g/dL (ref 30.0–36.0)
MCV: 94.6 fL (ref 80.0–100.0)
Monocytes Absolute: 0.3 10*3/uL (ref 0.1–1.0)
Monocytes Relative: 7 %
Neutro Abs: 1.7 10*3/uL (ref 1.7–7.7)
Neutrophils Relative %: 41 %
Platelets: 196 10*3/uL (ref 150–400)
RBC: 3.33 MIL/uL — ABNORMAL LOW (ref 3.87–5.11)
RDW: 13.5 % (ref 11.5–15.5)
WBC: 4 10*3/uL (ref 4.0–10.5)
nRBC: 0 % (ref 0.0–0.2)

## 2021-09-24 LAB — COMPREHENSIVE METABOLIC PANEL
ALT: 18 U/L (ref 0–44)
AST: 29 U/L (ref 15–41)
Albumin: 4.3 g/dL (ref 3.5–5.0)
Alkaline Phosphatase: 36 U/L — ABNORMAL LOW (ref 38–126)
Anion gap: 9 (ref 5–15)
BUN: 10 mg/dL (ref 8–23)
CO2: 28 mmol/L (ref 22–32)
Calcium: 9.2 mg/dL (ref 8.9–10.3)
Chloride: 93 mmol/L — ABNORMAL LOW (ref 98–111)
Creatinine, Ser: 0.84 mg/dL (ref 0.44–1.00)
GFR, Estimated: >60 mL/min (ref >60)
Glucose, Bld: 101 mg/dL — ABNORMAL HIGH (ref 70–99)
Potassium: 3.4 mmol/L — ABNORMAL LOW (ref 3.5–5.1)
Sodium: 130 mmol/L — ABNORMAL LOW (ref 135–145)
Total Bilirubin: 0.7 mg/dL (ref 0.3–1.2)
Total Protein: 6.3 g/dL — ABNORMAL LOW (ref 6.5–8.1)

## 2021-09-25 ENCOUNTER — Other Ambulatory Visit: Payer: Medicare Other

## 2021-09-28 LAB — BCR-ABL1 FISH
Cells Analyzed: 200
Cells Counted: 200

## 2021-10-02 LAB — BCR-ABL1, CML/ALL, PCR, QUANT: Interpretation (BCRAL):: NEGATIVE

## 2021-10-09 ENCOUNTER — Inpatient Hospital Stay (HOSPITAL_BASED_OUTPATIENT_CLINIC_OR_DEPARTMENT_OTHER): Payer: Medicare Other | Admitting: Oncology

## 2021-10-09 ENCOUNTER — Other Ambulatory Visit: Payer: Self-pay

## 2021-10-09 ENCOUNTER — Encounter: Payer: Self-pay | Admitting: Oncology

## 2021-10-09 VITALS — BP 157/72 | HR 70 | Temp 97.8°F | Resp 18 | Wt 139.8 lb

## 2021-10-09 DIAGNOSIS — H5789 Other specified disorders of eye and adnexa: Secondary | ICD-10-CM

## 2021-10-09 DIAGNOSIS — R197 Diarrhea, unspecified: Secondary | ICD-10-CM

## 2021-10-09 DIAGNOSIS — D649 Anemia, unspecified: Secondary | ICD-10-CM

## 2021-10-09 DIAGNOSIS — R220 Localized swelling, mass and lump, head: Secondary | ICD-10-CM | POA: Diagnosis not present

## 2021-10-09 DIAGNOSIS — E876 Hypokalemia: Secondary | ICD-10-CM | POA: Diagnosis not present

## 2021-10-09 DIAGNOSIS — Z5111 Encounter for antineoplastic chemotherapy: Secondary | ICD-10-CM

## 2021-10-09 DIAGNOSIS — T451X5A Adverse effect of antineoplastic and immunosuppressive drugs, initial encounter: Secondary | ICD-10-CM | POA: Diagnosis not present

## 2021-10-09 DIAGNOSIS — D6481 Anemia due to antineoplastic chemotherapy: Secondary | ICD-10-CM | POA: Diagnosis not present

## 2021-10-09 DIAGNOSIS — C9211 Chronic myeloid leukemia, BCR/ABL-positive, in remission: Secondary | ICD-10-CM

## 2021-10-09 MED ORDER — IMATINIB MESYLATE 400 MG PO TABS: 400.0000 mg | ORAL_TABLET | Freq: Every day | ORAL | 1 refills | Status: DC

## 2021-10-09 MED ORDER — POTASSIUM CHLORIDE CRYS ER 10 MEQ PO TBCR: 10.0000 meq | EXTENDED_RELEASE_TABLET | Freq: Every day | ORAL | 0 refills | Status: DC

## 2021-10-09 MED ORDER — LOPERAMIDE HCL 2 MG PO CAPS: 2.0000 mg | ORAL_CAPSULE | ORAL | 3 refills | Status: DC

## 2021-10-09 NOTE — Progress Notes (Signed)
Hematology/Oncology Progress note [phone] [phone]      Clinic Day:  10/09/2021  Referring physician: Glean Hess, MD  Chief Complaint: Jenna Stein is a 71 y.o. female with chronic phase CML who is seen for management of CML.  PERTINENT ONCOLOGY HISTORY Jenna Stein is a 70 y.o.afemale who has above oncology history reviewed by me today presented for follow up visit for management of CML Patient previously followed up by Dr.Corcoran, patient switched care to me on 01/03/2021 Extensive medical record review was performed by me  10/09/2020 abnormal peripheral smear (3% myelocytes) 10/25/2020  lab work-up revealed a hematocrit of 40.8, hemoglobin 13.7, platelets 352,000, WBC 10,200. LDH was 174.  BCR-ABL showed 70% of nuclei positive for BCR/ABL1 fusion.  Flow cytometry revealed left shifted aberrant neutrophils, aberrant monocytes and absolute basophilia.   BCR-ABL1 revealed 123.6615% b2a2, < 0.0032% b3a2, and < 0.0032% E1A2 transcript. The ABL1 e13a2 (b2a2, p210) fusion transcript was positive.   11/05/2020 bone marrow biopsy showed  slightly hypercellular marrow with mild granulocytic proliferation and atypical megakaryocytes. The combined bone marrow and peripheral blood findings were very limited but were generally c/w early involvement by chronic myeloid leukemia, chronic phase especially in the presence of previously documented positivity for BCR/ABL1 fusion in peripheral blood analysis.  Cytogenetics revealed 35, XX, t(9;22)(q34.1; q11.2)[19] / 73, XX [1].  Molecular genetics showed a BCR-ABL1 translocation t(9;22) with 65.5684 % BCR-ABL1/ABL1 (IS), major breakpoint p210, log reduction 0.001.  11/16/2020 abdominal ultrasound showed no splenomegaly (7.2 x 9.5 x 4.4 cm).   Relative risk (RR) Sokal 0.81 (intermediate) and [place]ford 684.83 (low) based on age (66), spleen (0), platelets (309), basophils (6%), eosinophils (3%), and myeloblasts (occ-1%). ELTS  risk score 1.5596 (low risk) based on 0% blasts and 1.6648 (intermediate-risk) based on 1% blasts.  11/28/2020, started on Gleevec 400 mg daily.  INTERVAL HISTORY Jenna Stein is a 71 y.o. female who has above history reviewed by me today presents for follow up visit for management of CML Problems and complaints are listed below:  Patient has been on Gleevec 400 mg daily.   Patient continues to have bilateral.  Periorbital edema for which she uses hydrocortisone as needed. Intermittent diarrhea, patient has used over-the-counter Imodium.  She was accompanied by her husband.   Past Medical History:  Diagnosis Date   Allergy    Anxiety    Arthritis 2010   Chronic myeloid leukemia, BCR/ABL-positive, not having achieved remission ([place]) 11/17/2020   Depression    Diabetes mellitus without complication ([place])    GERD (gastroesophageal reflux disease)    Hyperlipidemia    Hypertension     Past Surgical History:  Procedure Laterality Date   CYST EXCISION Left 2017   foot   EYE SURGERY  11/16/1996   myopia laser surgery   HAND SURGERY  07/2012   MASS EXCISION Right 08/13/2021   Procedure: EXCISION LOWER LIP MASS;  Surgeon: Clyde Canterbury, MD;  Location: [facility];  Service: ENT;  Laterality: Right;  Diabetic    Family History  Problem Relation Age of Onset   Hypertension Mother    Hyperlipidemia Mother    Arthritis Mother    Parkinsonism Father    Arthritis Other    Hypertension Other    Hyperlipidemia Other    Hypothyroidism Other    Breast cancer Sister 71   Cancer Sister     Social History:  reports that she has never smoked. She has never used smokeless tobacco. She reports  that she does not drink alcohol and does not use drugs. She denies tobacco and alcohol use. She denies any exposure to radiation or toxins. . 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 Her daughter and granddaughter live in [place]. She is originally from [place]. . Her husband is [employer] fast streamer.    Allergies:  Allergies  Allergen Reactions   Clonazepam Other (See Comments)    Nocturnal enuresis; Lethargy    Trazodone Anxiety and Other (See Comments)    Dizziness     Current Medications: Current Outpatient Medications  Medication Sig Dispense Refill   acyclovir (ZOVIRAX) 200 MG capsule Take 1 capsule (200 mg total) by mouth 5 (five) times daily. 90 capsule 1   atenolol (TENORMIN) 50 MG tablet Take 1 tablet (50 mg total) by mouth daily. 90 tablet 1   atorvastatin (LIPITOR) 10 MG tablet Take 1 tablet (10 mg total) by mouth at bedtime. 90 tablet 1   Coenzyme Q10 (CO Q 10) 100 MG CAPS Take 2 capsules by mouth daily.      diphenhydrAMINE (BENADRYL ALLERGY) 25 MG tablet Take 1 tablet (25 mg total) by mouth every 8 (eight) hours as needed. 30 tablet 2   estazolam (PROSOM) 2 MG tablet TAKE 1 TABLET BY MOUTH AT BEDTIME. 30 tablet 5   glucose blood (FREESTYLE LITE) test strip Use to test BS twice daily 100 each 12   hydrochlorothiazide (HYDRODIURIL) 25 MG tablet Take 1 tablet (25 mg total) by mouth daily. 90 tablet 1   hydrocortisone cream 0.5 % Apply 1 application topically 2 (two) times daily. Apply to periorbital area twice daily. 28 g 1   Lancets (FREESTYLE) lancets Use to test BS once daily 100 each 12   Lutein 20 MG TABS Take 1 tablet by mouth daily.      metFORMIN (GLUCOPHAGE) 1000 MG tablet Take 1 tablet (1,000 mg total) by mouth 2 (two) times daily with a meal. 180 tablet 1   MULTIPLE VITAMIN PO 1 tablet daily.      olopatadine (PATANOL) 0.1 % ophthalmic solution Apply to eye.     Omega-3 Fatty Acids (FISH OIL PEARLS PO) Take by mouth.     omeprazole (PRILOSEC) 20 MG capsule Take 1 capsule (20 mg total) by mouth daily. 90 capsule 1   PARoxetine (PAXIL) 20 MG tablet Take 1 tablet (20 mg total) by mouth daily. 90 tablet 1   potassium chloride (KLOR-CON M) 10 MEQ tablet Take 1 tablet (10 mEq total) by mouth daily. 90 tablet 0   SELENIUM PO Take by mouth daily.     sitaGLIPtin  (JANUVIA) 100 MG tablet Take 1 tablet (100 mg total) by mouth daily. 90 tablet 1   triamcinolone cream (KENALOG) 0.5 % APPLY 1 APPLICATION TOPICALLY 3 (THREE) TIMES DAILY. TO RASH ON ABDOMEN 30 g 0   TURMERIC PO Take 800 mg by mouth.     valsartan (DIOVAN) 320 MG tablet Take 1 tablet (320 mg total) by mouth daily. 90 tablet 1   Vitamin D, Ergocalciferol, (DRISDOL) 1.25 MG (50000 UNIT) CAPS capsule Take 50,000 Units by mouth every 7 (seven) days.     vitamin E (VITAMIN E) 400 UNIT capsule Take 1 capsule (400 Units total) by mouth daily. 1 capsule 0   zinc gluconate 50 MG tablet Take 50 mg by mouth daily.     imatinib (GLEEVEC) 400 MG tablet Take 1 tablet (400 mg total) by mouth daily. Take with meals and large glass of water.Caution:Chemotherapy. 90 tablet 1  loperamide (IMODIUM) 2 MG capsule Take 1 capsule (2 mg total) by mouth See admin instructions. Initial: 4 mg, followed by 2 mg after each loose stool; maximum: 16 mg/day 60 capsule 3   No current facility-administered medications for this visit.    Review of Systems  Constitutional:  Negative for chills, fever, malaise/fatigue and weight loss.  HENT:  Negative for sore throat.   Eyes:  Negative for redness.       Periorbital edema improved with hydrocortisone use.  Respiratory:  Negative for cough, shortness of breath and wheezing.   Cardiovascular:  Negative for chest pain, palpitations and leg swelling.  Gastrointestinal:  Positive for diarrhea. Negative for abdominal pain, blood in stool, nausea and vomiting.  Genitourinary:  Negative for dysuria.  Musculoskeletal:  Positive for joint pain.  Skin:  Negative for rash.  Neurological:  Negative for dizziness, tingling and tremors.  Endo/Heme/Allergies:  Does not bruise/bleed easily.  Psychiatric/Behavioral:  Negative for hallucinations.   Performance status (ECOG): 0 Vitals Blood pressure (!) 157/72, pulse 70, temperature 97.8 F (36.6 C), resp. rate 18, weight 139 lb 12.8 oz (63.4  kg).  Physical Exam Constitutional:      General: She is not in acute distress.    Appearance: She is not diaphoretic.  HENT:     Head: Normocephalic and atraumatic.     Nose: Nose normal.     Mouth/Throat:     Pharynx: No oropharyngeal exudate.  Eyes:     General: No scleral icterus.    Pupils: Pupils are equal, round, and reactive to light.  Cardiovascular:     Rate and Rhythm: Normal rate and regular rhythm.     Heart sounds: No murmur heard. Pulmonary:     Effort: Pulmonary effort is normal. No respiratory distress.     Breath sounds: No rales.  Chest:     Chest wall: No tenderness.  Abdominal:     General: There is no distension.     Palpations: Abdomen is soft.     Tenderness: There is no abdominal tenderness.  Musculoskeletal:        General: Normal range of motion.     Cervical back: Normal range of motion and neck supple.  Skin:    General: Skin is warm and dry.     Findings: No erythema.  Neurological:     Mental Status: She is alert and oriented to person, place, and time.     Cranial Nerves: No cranial nerve deficit.     Motor: No abnormal muscle tone.     Coordination: Coordination normal.  Psychiatric:        Mood and Affect: Affect normal.        Assessment and plan.  1. Encounter for antineoplastic chemotherapy   2. Chronic myeloid leukemia, BCR/ABL-positive, in remission ([place])   3. Periorbital swelling   4. Normocytic anemia   5. Diarrhea, unspecified type   6. Hypokalemia     # Chronic myelogenous leukemia-low risk disease [Sokal, has 4, ELTS] Labs reviewed and discussed with patient BCR ABL FISH negative. BCR ABL QT PCR <0.0032% She is in MR4 Continue Gleevec 400 mg daily.  #Chemotherapy-induced anemia.  Hemoglobin is 11 stable.[name] #Periorbital edema, improved after using topical hydrocortisone and hydrochlorothiazide dose increase. Continue current regimen.  #Hypertension, stable.  Continue atenolol, hydrochlorothiazide and losartan.   Commend patient to further discuss with primary care provider.  #Hemotherapy induced diarrhea, symptoms are stable.  Continue as needed Imodium, prescriptions were reviewed. #Mild hypocalcemia, continue  calcium supplementation 1200 mg daily.  Calcium level has improved. #Hypokalemia, potassium has improved.  3.4.  Recommend patient to take potassium chloride 8mq daily.  Prescription sent to pharmacy.  Follow-up with me in 3 months   ZEarlie Server MD, PhD [facility] Oncology 10/09/2021

## 2021-10-11 ENCOUNTER — Encounter: Payer: Medicare Other | Admitting: Internal Medicine

## 2021-10-14 ENCOUNTER — Ambulatory Visit: Payer: Medicare Other | Admitting: Internal Medicine

## 2021-10-24 ENCOUNTER — Encounter: Payer: Self-pay | Admitting: Internal Medicine

## 2021-10-24 ENCOUNTER — Ambulatory Visit (INDEPENDENT_AMBULATORY_CARE_PROVIDER_SITE_OTHER): Payer: Medicare Other | Admitting: Internal Medicine

## 2021-10-24 ENCOUNTER — Other Ambulatory Visit: Payer: Self-pay

## 2021-10-24 VITALS — BP 138/70 | HR 71 | Ht 64.0 in | Wt 139.0 lb

## 2021-10-24 DIAGNOSIS — E118 Type 2 diabetes mellitus with unspecified complications: Secondary | ICD-10-CM | POA: Diagnosis not present

## 2021-10-24 DIAGNOSIS — I10 Essential (primary) hypertension: Secondary | ICD-10-CM

## 2021-10-24 DIAGNOSIS — F324 Major depressive disorder, single episode, in partial remission: Secondary | ICD-10-CM | POA: Diagnosis not present

## 2021-10-24 DIAGNOSIS — Z1231 Encounter for screening mammogram for malignant neoplasm of breast: Secondary | ICD-10-CM

## 2021-10-24 DIAGNOSIS — E785 Hyperlipidemia, unspecified: Secondary | ICD-10-CM

## 2021-10-24 DIAGNOSIS — E1169 Type 2 diabetes mellitus with other specified complication: Secondary | ICD-10-CM | POA: Diagnosis not present

## 2021-10-24 DIAGNOSIS — F5101 Primary insomnia: Secondary | ICD-10-CM

## 2021-10-24 DIAGNOSIS — A6000 Herpesviral infection of urogenital system, unspecified: Secondary | ICD-10-CM

## 2021-10-24 DIAGNOSIS — Z1211 Encounter for screening for malignant neoplasm of colon: Secondary | ICD-10-CM

## 2021-10-24 DIAGNOSIS — K2100 Gastro-esophageal reflux disease with esophagitis, without bleeding: Secondary | ICD-10-CM | POA: Insufficient documentation

## 2021-10-24 DIAGNOSIS — T451X5A Adverse effect of antineoplastic and immunosuppressive drugs, initial encounter: Secondary | ICD-10-CM | POA: Insufficient documentation

## 2021-10-24 DIAGNOSIS — D701 Agranulocytosis secondary to cancer chemotherapy: Secondary | ICD-10-CM | POA: Diagnosis not present

## 2021-10-24 DIAGNOSIS — U071 COVID-19: Secondary | ICD-10-CM | POA: Diagnosis not present

## 2021-10-24 LAB — POCT URINALYSIS DIPSTICK
Bilirubin, UA: NEGATIVE
Blood, UA: NEGATIVE
Glucose, UA: NEGATIVE
Ketones, UA: NEGATIVE
Leukocytes, UA: NEGATIVE
Nitrite, UA: NEGATIVE
Protein, UA: NEGATIVE
Spec Grav, UA: 1.01 (ref 1.010–1.025)
Urobilinogen, UA: 0.2 E.U./dL
pH, UA: 6 (ref 5.0–8.0)

## 2021-10-24 MED ORDER — ACYCLOVIR 200 MG PO CAPS: 200.0000 mg | ORAL_CAPSULE | Freq: Every day | ORAL | 1 refills | Status: DC

## 2021-10-24 MED ORDER — METFORMIN HCL 1000 MG PO TABS: 1000.0000 mg | ORAL_TABLET | Freq: Two times a day (BID) | ORAL | 1 refills | Status: DC

## 2021-10-24 MED ORDER — ZALEPLON 10 MG PO CAPS: 10.0000 mg | ORAL_CAPSULE | Freq: Every day | ORAL | 1 refills | Status: DC

## 2021-10-24 MED ORDER — VALSARTAN 320 MG PO TABS: 320.0000 mg | ORAL_TABLET | Freq: Every day | ORAL | 1 refills | Status: DC

## 2021-10-24 MED ORDER — ATENOLOL 50 MG PO TABS: 50.0000 mg | ORAL_TABLET | Freq: Every day | ORAL | 1 refills | Status: DC

## 2021-10-24 MED ORDER — FREESTYLE LITE TEST VI STRP: ORAL_STRIP | 12 refills | Status: DC

## 2021-10-24 MED ORDER — PAROXETINE HCL 20 MG PO TABS: 20.0000 mg | ORAL_TABLET | Freq: Every day | ORAL | 1 refills | Status: DC

## 2021-10-24 MED ORDER — SITAGLIPTIN PHOSPHATE 100 MG PO TABS: 100.0000 mg | ORAL_TABLET | Freq: Every day | ORAL | 1 refills | Status: DC

## 2021-10-24 MED ORDER — ATORVASTATIN CALCIUM 10 MG PO TABS: 10.0000 mg | ORAL_TABLET | Freq: Every day | ORAL | 1 refills | Status: DC

## 2021-10-24 MED ORDER — HYDROCHLOROTHIAZIDE 25 MG PO TABS: 25.0000 mg | ORAL_TABLET | Freq: Every day | ORAL | 1 refills | Status: DC

## 2021-10-24 NOTE — Progress Notes (Signed)
Date:  10/24/2021   Name:  Jenna Stein   DOB:  [birth year]   MRN:  [id number]   Chief Complaint: Annual Exam (Breast Exam. ) Jenna Stein is a 71 y.o. female who presents today for her Complete Annual Exam. She feels well. She reports exercising. She reports she is sleeping poorly. Breast complaints - none.  Mammogram: 11/2020 DEXA: 11/2020 osteopenia hip Colonoscopy: 08/2021 repeat 10 yrs  Immunization History  Administered Date(s) Administered   Fluad Quad(high Dose 65+) 05/24/2021   Influenza Split 04/18/2011, 05/24/2012   Influenza, High Dose Seasonal PF 07/03/2017, 04/29/2018   Influenza,inj,Quad PF,6+ Mos 05/02/2013, 05/03/2014, 05/09/2015   Influenza,inj,quad, With Preservative 07/18/2016   Influenza-Unspecified 04/18/2017, 05/03/2018, 05/18/2020   PFIZER(Purple Top)SARS-COV-2 Vaccination 09/27/2019, 10/18/2019, 04/18/2020   Pneumococcal Conjugate-13 10/10/2016   Pneumococcal Polysaccharide-23 08/07/2017   Td 08/04/2003   Tdap 08/04/2003, 04/18/2011   Zoster Recombinat (Shingrix) 04/29/2018, 07/29/2018   Zoster, Live 01/12/2013    Hypertension This is a chronic problem. The problem is controlled. Pertinent negatives include no chest pain, headaches, palpitations or shortness of breath. Past treatments include beta blockers, diuretics and angiotensin blockers. There is no history of kidney disease, CAD/MI or CVA.  Diabetes She presents for her follow-up diabetic visit. She has type 2 diabetes mellitus. Her disease course has been stable. Pertinent negatives for hypoglycemia include no dizziness, headaches, nervousness/anxiousness or tremors. Pertinent negatives for diabetes include no chest pain, no fatigue, no polydipsia and no polyuria. Pertinent negatives for diabetic complications include no CVA. Current diabetic treatment includes oral agent (dual therapy) (metformin and januvia). Her breakfast blood glucose is taken between 6-7 am. Her breakfast blood glucose range is  generally 90-110 mg/dl. An ACE inhibitor/angiotensin II receptor blocker is being taken. Eye exam is current.  Hyperlipidemia This is a chronic problem. The problem is controlled. Pertinent negatives include no chest pain or shortness of breath. Current antihyperlipidemic treatment includes statins. The current treatment provides significant improvement of lipids.  Depression        This is a chronic problem.The problem is unchanged.  Associated symptoms include no fatigue and no headaches.  Past treatments include SSRIs - Selective serotonin reuptake inhibitors.  Lab Results  Component Value Date   NA 130 (L) 09/24/2021   K 3.4 (L) 09/24/2021   CO2 28 09/24/2021   GLUCOSE 101 (H) 09/24/2021   BUN 10 09/24/2021   CREATININE 0.84 09/24/2021   CALCIUM 9.2 09/24/2021   GFRNONAA >60 09/24/2021   Lab Results  Component Value Date   CHOL 165 10/09/2020   HDL 47 10/09/2020   LDLCALC 94 10/09/2020   TRIG 133 10/09/2020   CHOLHDL 3.5 10/09/2020   Lab Results  Component Value Date   TSH 1.011 01/03/2021   Lab Results  Component Value Date   HGBA1C 5.3 05/24/2021   Lab Results  Component Value Date   WBC 4.0 09/24/2021   HGB 11.0 (L) 09/24/2021   HCT 31.5 (L) 09/24/2021   MCV 94.6 09/24/2021   PLT 196 09/24/2021   Lab Results  Component Value Date   ALT 18 09/24/2021   AST 29 09/24/2021   ALKPHOS 36 (L) 09/24/2021   BILITOT 0.7 09/24/2021   Lab Results  Component Value Date   VD25OH 29.3 (L) 05/07/2015     Review of Systems  Constitutional:  Negative for chills, fatigue and fever.  HENT:  Negative for congestion, hearing loss, tinnitus, trouble swallowing and voice change.   Eyes:  Negative for visual disturbance.  Respiratory:  Negative for cough, chest tightness, shortness of breath and wheezing.   Cardiovascular:  Negative for chest pain, palpitations and leg swelling.  Gastrointestinal:  Negative for abdominal pain, constipation, diarrhea and vomiting.  Endocrine:  Negative for polydipsia and polyuria.  Genitourinary:  Negative for dysuria, frequency, genital sores, vaginal bleeding and vaginal discharge.  Musculoskeletal:  Negative for arthralgias, gait problem and joint swelling.  Skin:  Negative for color change and rash.  Neurological:  Negative for dizziness, tremors, light-headedness and headaches.  Hematological:  Negative for adenopathy. Does not bruise/bleed easily.  Psychiatric/Behavioral:  Positive for depression. Negative for dysphoric mood and sleep disturbance. The patient is not nervous/anxious.    Patient Active Problem List   Diagnosis Date Noted   Chemotherapy-induced neutropenia ([place]) 10/24/2021   Gastroesophageal reflux disease with esophagitis 10/24/2021   Chronic myeloid leukemia, BCR/ABL-positive, not having achieved remission ([place]) 11/17/2020   Abnormal blood smear 10/25/2020   Slow transit constipation 08/13/2018   Type II diabetes mellitus with complication ([place]) 94/58/5929   Dry mouth 10/10/2016   Allergic rhinitis, seasonal 04/30/2015   Hepatic steatosis 04/30/2015   Osteoarthritis of both hands 04/30/2015   Genital herpes 04/04/2009   Depression, major, single episode, in partial remission ([place]) 08/18/1998   Essential hypertension 08/18/1998   Hyperlipidemia associated with type 2 diabetes mellitus ([place]) 08/18/1998   Primary insomnia 08/18/1998    Allergies  Allergen Reactions   Clonazepam Other (See Comments)    Nocturnal enuresis; Lethargy    Trazodone Anxiety and Other (See Comments)    Dizziness     Past Surgical History:  Procedure Laterality Date   CYST EXCISION Left 2017   foot   EYE SURGERY  11/16/1996   myopia laser surgery   HAND SURGERY  07/2012   MASS EXCISION Right 08/13/2021   Procedure: EXCISION LOWER LIP MASS;  Surgeon: Clyde Canterbury, MD;  Location: [facility];  Service: ENT;  Laterality: Right;  Diabetic    Social History   Tobacco Use   Smoking status: Never   Smokeless  tobacco: Never   Tobacco comments:    smoking cessation materials not required  Vaping Use   Vaping Use: Never used  Substance Use Topics   Alcohol use: No   Drug use: No     Medication list has been reviewed and updated.  Current Meds  Medication Sig   Coenzyme Q10 (CO Q 10) 100 MG CAPS Take 2 capsules by mouth daily.    diphenhydrAMINE (BENADRYL ALLERGY) 25 MG tablet Take 1 tablet (25 mg total) by mouth every 8 (eight) hours as needed.   estazolam (PROSOM) 2 MG tablet TAKE 1 TABLET BY MOUTH AT BEDTIME.   hydrocortisone cream 0.5 % Apply 1 application topically 2 (two) times daily. Apply to periorbital area twice daily.   imatinib (GLEEVEC) 400 MG tablet Take 1 tablet (400 mg total) by mouth daily. Take with meals and large glass of water.Caution:Chemotherapy.   Lancets (FREESTYLE) lancets Use to test BS once daily   loperamide (IMODIUM) 2 MG capsule Take 1 capsule (2 mg total) by mouth See admin instructions. Initial: 4 mg, followed by 2 mg after each loose stool; maximum: 16 mg/day   Lutein 20 MG TABS Take 1 tablet by mouth daily.    MULTIPLE VITAMIN PO 1 tablet daily.    olopatadine (PATANOL) 0.1 % ophthalmic solution Apply to eye.   Omega-3 Fatty Acids (FISH OIL PEARLS PO) Take by mouth.   omeprazole (PRILOSEC) 20 MG capsule Take  1 capsule (20 mg total) by mouth daily.   potassium chloride (KLOR-CON M) 10 MEQ tablet Take 1 tablet (10 mEq total) by mouth daily.   SELENIUM PO Take by mouth daily.   triamcinolone cream (KENALOG) 0.5 % APPLY 1 APPLICATION TOPICALLY 3 (THREE) TIMES DAILY. TO RASH ON ABDOMEN   TURMERIC PO Take 800 mg by mouth.   Vitamin D, Ergocalciferol, (DRISDOL) 1.25 MG (50000 UNIT) CAPS capsule Take 50,000 Units by mouth every 7 (seven) days.   vitamin E (VITAMIN E) 400 UNIT capsule Take 1 capsule (400 Units total) by mouth daily.   zinc gluconate 50 MG tablet Take 50 mg by mouth daily.   [DISCONTINUED] acyclovir (ZOVIRAX) 200 MG capsule Take 1 capsule (200 mg  total) by mouth 5 (five) times daily.   [DISCONTINUED] atenolol (TENORMIN) 50 MG tablet Take 1 tablet (50 mg total) by mouth daily.   [DISCONTINUED] atorvastatin (LIPITOR) 10 MG tablet Take 1 tablet (10 mg total) by mouth at bedtime.   [DISCONTINUED] glucose blood (FREESTYLE LITE) test strip Use to test BS twice daily   [DISCONTINUED] hydrochlorothiazide (HYDRODIURIL) 25 MG tablet Take 1 tablet (25 mg total) by mouth daily.   [DISCONTINUED] metFORMIN (GLUCOPHAGE) 1000 MG tablet Take 1 tablet (1,000 mg total) by mouth 2 (two) times daily with a meal.   [DISCONTINUED] PARoxetine (PAXIL) 20 MG tablet Take 1 tablet (20 mg total) by mouth daily.   [DISCONTINUED] sitaGLIPtin (JANUVIA) 100 MG tablet Take 1 tablet (100 mg total) by mouth daily.   [DISCONTINUED] valsartan (DIOVAN) 320 MG tablet Take 1 tablet (320 mg total) by mouth daily.    PHQ 2/9 Scores 10/24/2021 09/11/2021 05/24/2021 01/25/2021  PHQ - 2 Score 0 0 1 2  PHQ- 9 Score 6 - 3 3    GAD 7 : Generalized Anxiety Score 10/24/2021 05/24/2021 01/25/2021 10/09/2020  Nervous, Anxious, on Edge 0 0 0 0  Control/stop worrying 0 0 0 0  Worry too much - different things 0 0 0 0  Trouble relaxing 0 0 0 0  Restless 0 0 0 0  Easily annoyed or irritable 0 0 0 0  Afraid - awful might happen 0 0 0 0  Total GAD 7 Score 0 0 0 0  Anxiety Difficulty Not difficult at all Not difficult at all - -    BP Readings from Last 3 Encounters:  10/24/21 138/70  10/09/21 (!) 157/72  08/13/21 111/62    Physical Exam Vitals and nursing note reviewed.  Constitutional:      General: She is not in acute distress.    Appearance: She is well-developed.  HENT:     Head: Normocephalic and atraumatic.     Right Ear: Tympanic membrane and ear canal normal.     Left Ear: Tympanic membrane and ear canal normal.     Nose:     Right Sinus: No maxillary sinus tenderness.     Left Sinus: No maxillary sinus tenderness.  Eyes:     General: No scleral icterus.       Right eye:  No discharge.        Left eye: No discharge.     Conjunctiva/sclera: Conjunctivae normal.  Neck:     Thyroid: No thyromegaly.     Vascular: No carotid bruit.  Cardiovascular:     Rate and Rhythm: Normal rate and regular rhythm.     Pulses: Normal pulses.     Heart sounds: Normal heart sounds.  Pulmonary:     Effort: Pulmonary  effort is normal. No respiratory distress.     Breath sounds: No wheezing.  Chest:  Breasts:    Right: No mass, nipple discharge, skin change or tenderness.     Left: No mass, nipple discharge, skin change or tenderness.  Abdominal:     General: Bowel sounds are normal.     Palpations: Abdomen is soft.     Tenderness: There is no abdominal tenderness.  Musculoskeletal:     Cervical back: Normal range of motion. No erythema.     Right lower leg: No edema.     Left lower leg: No edema.  Lymphadenopathy:     Cervical: No cervical adenopathy.  Skin:    General: Skin is warm and dry.     Capillary Refill: Capillary refill takes less than 2 seconds.     Findings: No rash.  Neurological:     General: No focal deficit present.     Mental Status: She is alert and oriented to person, place, and time.     Cranial Nerves: No cranial nerve deficit.     Sensory: No sensory deficit.     Deep Tendon Reflexes: Reflexes are normal and symmetric.  Psychiatric:        Attention and Perception: Attention normal.        Mood and Affect: Mood normal.    Wt Readings from Last 3 Encounters:  10/24/21 139 lb (63 kg)  10/09/21 139 lb 12.8 oz (63.4 kg)  08/13/21 139 lb 3.2 oz (63.1 kg)    BP 138/70    Pulse 71    Ht '5\' 4"'$  (1.626 m)    Wt 139 lb (63 kg)    SpO2 98%    BMI 23.86 kg/m   Assessment and Plan: 1. Essential hypertension Clinically stable exam with well controlled BP. Tolerating medications without side effects at this time. Pt to continue current regimen and low sodium diet; benefits of regular exercise as able discussed. - CBC with Differential/Platelet -  TSH - hydrochlorothiazide (HYDRODIURIL) 25 MG tablet; Take 1 tablet (25 mg total) by mouth daily.  Dispense: 90 tablet; Refill: 1 - atenolol (TENORMIN) 50 MG tablet; Take 1 tablet (50 mg total) by mouth daily.  Dispense: 90 tablet; Refill: 1 - valsartan (DIOVAN) 320 MG tablet; Take 1 tablet (320 mg total) by mouth daily.  Dispense: 90 tablet; Refill: 1 - POCT urinalysis dipstick  2. Encounter for screening mammogram for breast cancer Schedule at [facility] - MM 3D SCREEN BREAST BILATERAL  3. Colon cancer screening Recent Colonoscopy normal - repeat in 10 yrs  4. Type II diabetes mellitus with complication (HCC) Clinically stable by exam and report without s/s of hypoglycemia. DM complicated by hypertension and dyslipidemia. Tolerating medications well without side effects or other concerns. - Comprehensive metabolic panel - Hemoglobin A1c - metFORMIN (GLUCOPHAGE) 1000 MG tablet; Take 1 tablet (1,000 mg total) by mouth 2 (two) times daily with a meal.  Dispense: 180 tablet; Refill: 1 - sitaGLIPtin (JANUVIA) 100 MG tablet; Take 1 tablet (100 mg total) by mouth daily.  Dispense: 90 tablet; Refill: 1 - glucose blood (FREESTYLE LITE) test strip; Use to test BS twice daily  Dispense: 100 each; Refill: 12 - Microalbumin / creatinine urine ratio  5. Hyperlipidemia associated with type 2 diabetes mellitus ([place]) Tolerating statin medication without side effects at this time LDL is at goal of < 70 on current dose Continue same therapy without change at this time. - Lipid panel - atorvastatin (LIPITOR) 10 MG tablet;  Take 1 tablet (10 mg total) by mouth at bedtime.  Dispense: 90 tablet; Refill: 1  6. Depression, major, single episode, in partial remission (HCC) Clinically stable on current regimen with good control of symptoms, No SI or HI. Will continue current therapy. - PARoxetine (PAXIL) 20 MG tablet; Take 1 tablet (20 mg total) by mouth daily.  Dispense: 90 tablet; Refill: 1  7. Genital herpes  simplex, unspecified site - acyclovir (ZOVIRAX) 200 MG capsule; Take 1 capsule (200 mg total) by mouth 5 (five) times daily.  Dispense: 90 capsule; Refill: 1  8. Primary insomnia - zaleplon (SONATA) 10 MG capsule; Take 1 capsule (10 mg total) by mouth at bedtime.  Dispense: 90 capsule; Refill: 1  9. Chemotherapy-induced neutropenia (HCC) stable  10. Gastroesophageal reflux disease with esophagitis without hemorrhage Symptoms well controlled on daily PPI Recent EGD with dilatation. Biopsy negative except for gastritis. Will continue omeprazole indefinitely.   Partially dictated using Editor, commissioning. Any errors are unintentional.  Halina Maidens, MD [facility]  10/24/2021

## 2021-10-25 LAB — MICROALBUMIN / CREATININE URINE RATIO
Creatinine, Urine: 117.1 mg/dL
Microalb/Creat Ratio: 23 mg/g creat (ref 0–29)
Microalbumin, Urine: 27.4 ug/mL

## 2021-10-26 LAB — COMPREHENSIVE METABOLIC PANEL
ALT: 17 IU/L (ref 0–32)
AST: 26 IU/L (ref 0–40)
Albumin/Globulin Ratio: 2.4 — ABNORMAL HIGH (ref 1.2–2.2)
Albumin: 4.5 g/dL (ref 3.8–4.8)
Alkaline Phosphatase: 39 IU/L — ABNORMAL LOW (ref 44–121)
BUN/Creatinine Ratio: 9 — ABNORMAL LOW (ref 12–28)
BUN: 8 mg/dL (ref 8–27)
Bilirubin Total: 0.4 mg/dL (ref 0.0–1.2)
CO2: 25 mmol/L (ref 20–29)
Calcium: 9.1 mg/dL (ref 8.7–10.3)
Chloride: 98 mmol/L (ref 96–106)
Creatinine, Ser: 0.9 mg/dL (ref 0.57–1.00)
Globulin, Total: 1.9 g/dL (ref 1.5–4.5)
Glucose: 97 mg/dL (ref 70–99)
Potassium: 3.5 mmol/L (ref 3.5–5.2)
Sodium: 139 mmol/L (ref 134–144)
Total Protein: 6.4 g/dL (ref 6.0–8.5)
eGFR: 69 mL/min/{1.73_m2} (ref >59)

## 2021-10-26 LAB — CBC WITH DIFFERENTIAL/PLATELET
Basophils Absolute: 0.1 10*3/uL (ref 0.0–0.2)
Basos: 1 %
EOS (ABSOLUTE): 0.5 10*3/uL — ABNORMAL HIGH (ref 0.0–0.4)
Eos: 12 %
Hematocrit: 30.7 % — ABNORMAL LOW (ref 34.0–46.6)
Hemoglobin: 10.3 g/dL — ABNORMAL LOW (ref 11.1–15.9)
Immature Grans (Abs): 0 10*3/uL (ref 0.0–0.1)
Immature Granulocytes: 0 %
Lymphocytes Absolute: 1.8 10*3/uL (ref 0.7–3.1)
Lymphs: 43 %
MCH: 32.9 pg (ref 26.6–33.0)
MCHC: 33.6 g/dL (ref 31.5–35.7)
MCV: 98 fL — ABNORMAL HIGH (ref 79–97)
Monocytes Absolute: 0.3 10*3/uL (ref 0.1–0.9)
Monocytes: 7 %
Neutrophils Absolute: 1.6 10*3/uL (ref 1.4–7.0)
Neutrophils: 37 %
Platelets: 194 10*3/uL (ref 150–450)
RBC: 3.13 x10E6/uL — ABNORMAL LOW (ref 3.77–5.28)
RDW: 13.4 % (ref 11.7–15.4)
WBC: 4.2 10*3/uL (ref 3.4–10.8)

## 2021-10-26 LAB — HEMOGLOBIN A1C
Est. average glucose Bld gHb Est-mCnc: 111 mg/dL
Hgb A1c MFr Bld: 5.5 % (ref 4.8–5.6)

## 2021-10-26 LAB — LIPID PANEL
Chol/HDL Ratio: 2.1 ratio (ref 0.0–4.4)
Cholesterol, Total: 115 mg/dL (ref 100–199)
HDL: 54 mg/dL (ref >39)
LDL Chol Calc (NIH): 46 mg/dL (ref 0–99)
Triglycerides: 75 mg/dL (ref 0–149)
VLDL Cholesterol Cal: 15 mg/dL (ref 5–40)

## 2021-10-26 LAB — TSH: TSH: 1.86 u[IU]/mL (ref 0.450–4.500)

## 2021-10-30 ENCOUNTER — Telehealth: Payer: Self-pay

## 2021-10-30 NOTE — Telephone Encounter (Signed)
Called pt as a reminder to call and schedule a mammogram. Pt verbalized understanding. ? ?KP ?

## 2021-11-24 DIAGNOSIS — U071 COVID-19: Secondary | ICD-10-CM | POA: Diagnosis not present

## 2021-11-25 DIAGNOSIS — K2100 Gastro-esophageal reflux disease with esophagitis, without bleeding: Secondary | ICD-10-CM | POA: Diagnosis not present

## 2021-11-25 DIAGNOSIS — R197 Diarrhea, unspecified: Secondary | ICD-10-CM | POA: Diagnosis not present

## 2021-11-25 DIAGNOSIS — R1314 Dysphagia, pharyngoesophageal phase: Secondary | ICD-10-CM | POA: Diagnosis not present

## 2021-12-04 ENCOUNTER — Other Ambulatory Visit: Payer: Self-pay | Admitting: Internal Medicine

## 2021-12-04 DIAGNOSIS — F5101 Primary insomnia: Secondary | ICD-10-CM

## 2021-12-05 NOTE — Telephone Encounter (Signed)
Requested medication (s) are due for refill today: yes ? ?Requested medication (s) are on the active medication list: yes ? ?Last refill:  04/15/21 ? ?Future visit scheduled: yes ? ?Notes to clinic:  med not delegated to NT to RF ? ? ?Requested Prescriptions  ?Pending Prescriptions Disp Refills  ? estazolam (PROSOM) 2 MG tablet [Pharmacy Med Name: ESTAZOLAM 2 MG TABLET] 30 tablet   ?  Sig: TAKE 1 TABLET BY MOUTH EVERYDAY AT BEDTIME  ?  ? Not Delegated - Psychiatry: Anxiolytics/Hypnotics 2 Failed - 12/04/2021 10:28 PM  ?  ?  Failed - This refill cannot be delegated  ?  ?  Failed - Urine Drug Screen completed in last 360 days  ?  ?  Passed - Patient is not pregnant  ?  ?  Passed - Valid encounter within last 6 months  ?  Recent Outpatient Visits   ? ?      ? 1 month ago Essential hypertension  ? [facility] Glean Hess, MD  ? 6 months ago Type II diabetes mellitus with complication [facility])  ? [facility] Glean Hess, MD  ? 10 months ago Type II diabetes mellitus with complication [facility])  ? [facility] Glean Hess, MD  ? 1 year ago Essential hypertension  ? [facility] Glean Hess, MD  ? 1 year ago Type II diabetes mellitus with complication [facility])  ? [facility] Glean Hess, MD  ? ?  ?  ?Future Appointments   ? ?        ? In 3 months Army Melia Jesse Sans, MD [facility], [place]  ? In 10 months Glean Hess, MD [facility], [place]  ? ?  ? ? ?  ?  ?  ? ? ? ? ?

## 2021-12-05 NOTE — Telephone Encounter (Signed)
Please review. Tired to fill it printed. ? ?KP

## 2021-12-10 ENCOUNTER — Other Ambulatory Visit: Payer: Self-pay | Admitting: Internal Medicine

## 2021-12-10 ENCOUNTER — Telehealth: Payer: Self-pay

## 2021-12-10 ENCOUNTER — Ambulatory Visit
Admission: RE | Admit: 2021-12-10 | Discharge: 2021-12-10 | Disposition: A | Discharge status: Home / Self Care | Payer: Medicare Other | Source: Ambulatory Visit | Attending: Internal Medicine | Admitting: Internal Medicine

## 2021-12-10 DIAGNOSIS — F5101 Primary insomnia: Secondary | ICD-10-CM

## 2021-12-10 DIAGNOSIS — Z1231 Encounter for screening mammogram for malignant neoplasm of breast: Secondary | ICD-10-CM | POA: Insufficient documentation

## 2021-12-10 MED ORDER — ESTAZOLAM 2 MG PO TABS: ORAL_TABLET | ORAL | 2 refills | Status: DC

## 2021-12-10 NOTE — Telephone Encounter (Signed)
estazolam (PROSOM) 2 MG tablet  ?Patient would like to see if she can be prescribed this medication from her past history the other sleep medication does not work for her. Please send over to [facility]. ?

## 2021-12-10 NOTE — Telephone Encounter (Signed)
Please review.  KP

## 2021-12-12 DIAGNOSIS — E119 Type 2 diabetes mellitus without complications: Secondary | ICD-10-CM | POA: Diagnosis not present

## 2021-12-12 LAB — HM DIABETES EYE EXAM

## 2021-12-12 NOTE — Telephone Encounter (Signed)
Requested medication (s) are due for refill today:   Provider to review ? ?Requested medication (s) are on the active medication list:   Yes ? ?Future visit scheduled:   Yes ? ? ?Last ordered: 12/10/2021 #30, 2 refills ? ?Non delegated refill reason returned.  ? ?Requested Prescriptions  ?Pending Prescriptions Disp Refills  ? estazolam (PROSOM) 2 MG tablet [Pharmacy Med Name: ESTAZOLAM 2 MG TABLET] 30 tablet   ?  Sig: TAKE 1 TABLET BY MOUTH EVERYDAY AT BEDTIME  ?  ? Not Delegated - Psychiatry: Anxiolytics/Hypnotics 2 Failed - 12/10/2021  2:06 PM  ?  ?  Failed - This refill cannot be delegated  ?  ?  Failed - Urine Drug Screen completed in last 360 days  ?  ?  Passed - Patient is not pregnant  ?  ?  Passed - Valid encounter within last 6 months  ?  Recent Outpatient Visits   ? ?      ? 1 month ago Essential hypertension  ? [facility] Glean Hess, MD  ? 6 months ago Type II diabetes mellitus with complication [facility])  ? [facility] Glean Hess, MD  ? 10 months ago Type II diabetes mellitus with complication [facility])  ? [facility] Glean Hess, MD  ? 1 year ago Essential hypertension  ? [facility] Glean Hess, MD  ? 1 year ago Type II diabetes mellitus with complication [facility])  ? [facility] Glean Hess, MD  ? ?  ?  ?Future Appointments   ? ?        ? In 2 months Army Melia Jesse Sans, MD [facility], [place]  ? In 10 months Glean Hess, MD [facility], [place]  ? ?  ? ? ?  ?  ?  ? ?

## 2021-12-23 DIAGNOSIS — K130 Diseases of lips: Secondary | ICD-10-CM | POA: Diagnosis not present

## 2021-12-24 DIAGNOSIS — U071 COVID-19: Secondary | ICD-10-CM | POA: Diagnosis not present

## 2022-01-06 ENCOUNTER — Inpatient Hospital Stay: Payer: Medicare Other | Attending: Oncology

## 2022-01-06 DIAGNOSIS — C9211 Chronic myeloid leukemia, BCR/ABL-positive, in remission: Secondary | ICD-10-CM | POA: Insufficient documentation

## 2022-01-06 LAB — CBC WITH DIFFERENTIAL/PLATELET
Abs Immature Granulocytes: 0 10*3/uL (ref 0.00–0.07)
Basophils Absolute: 0.1 10*3/uL (ref 0.0–0.1)
Basophils Relative: 1 %
Eosinophils Absolute: 0.6 10*3/uL — ABNORMAL HIGH (ref 0.0–0.5)
Eosinophils Relative: 12 %
HCT: 33 % — ABNORMAL LOW (ref 36.0–46.0)
Hemoglobin: 11.3 g/dL — ABNORMAL LOW (ref 12.0–15.0)
Immature Granulocytes: 0 %
Lymphocytes Relative: 32 %
Lymphs Abs: 1.7 10*3/uL (ref 0.7–4.0)
MCH: 33 pg (ref 26.0–34.0)
MCHC: 34.2 g/dL (ref 30.0–36.0)
MCV: 96.5 fL (ref 80.0–100.0)
Monocytes Absolute: 0.4 10*3/uL (ref 0.1–1.0)
Monocytes Relative: 8 %
Neutro Abs: 2.5 10*3/uL (ref 1.7–7.7)
Neutrophils Relative %: 47 %
Platelets: 196 10*3/uL (ref 150–400)
RBC: 3.42 MIL/uL — ABNORMAL LOW (ref 3.87–5.11)
RDW: 12.7 % (ref 11.5–15.5)
WBC: 5.2 10*3/uL (ref 4.0–10.5)
nRBC: 0 % (ref 0.0–0.2)

## 2022-01-06 LAB — COMPREHENSIVE METABOLIC PANEL
ALT: 19 U/L (ref 0–44)
AST: 29 U/L (ref 15–41)
Albumin: 4.2 g/dL (ref 3.5–5.0)
Alkaline Phosphatase: 40 U/L (ref 38–126)
Anion gap: 7 (ref 5–15)
BUN: 15 mg/dL (ref 8–23)
CO2: 28 mmol/L (ref 22–32)
Calcium: 8.6 mg/dL — ABNORMAL LOW (ref 8.9–10.3)
Chloride: 100 mmol/L (ref 98–111)
Creatinine, Ser: 1.01 mg/dL — ABNORMAL HIGH (ref 0.44–1.00)
GFR, Estimated: 60 mL/min — ABNORMAL LOW (ref >60)
Glucose, Bld: 133 mg/dL — ABNORMAL HIGH (ref 70–99)
Potassium: 3.3 mmol/L — ABNORMAL LOW (ref 3.5–5.1)
Sodium: 135 mmol/L (ref 135–145)
Total Bilirubin: 0.8 mg/dL (ref 0.3–1.2)
Total Protein: 6.6 g/dL (ref 6.5–8.1)

## 2022-01-09 LAB — BCR-ABL1 FISH
Cells Analyzed: 200
Cells Counted: 200

## 2022-01-15 LAB — BCR-ABL1, CML/ALL, PCR, QUANT: Interpretation (BCRAL):: NEGATIVE

## 2022-01-20 ENCOUNTER — Encounter: Payer: Self-pay | Admitting: Oncology

## 2022-01-20 ENCOUNTER — Inpatient Hospital Stay: Payer: Medicare Other | Attending: Oncology | Admitting: Oncology

## 2022-01-20 VITALS — BP 164/73 | HR 72 | Temp 97.6°F | Resp 18 | Wt 141.2 lb

## 2022-01-20 DIAGNOSIS — H5789 Other specified disorders of eye and adnexa: Secondary | ICD-10-CM | POA: Insufficient documentation

## 2022-01-20 DIAGNOSIS — D6481 Anemia due to antineoplastic chemotherapy: Secondary | ICD-10-CM | POA: Insufficient documentation

## 2022-01-20 DIAGNOSIS — Z79899 Other long term (current) drug therapy: Secondary | ICD-10-CM | POA: Insufficient documentation

## 2022-01-20 DIAGNOSIS — E876 Hypokalemia: Secondary | ICD-10-CM | POA: Diagnosis not present

## 2022-01-20 DIAGNOSIS — T451X5A Adverse effect of antineoplastic and immunosuppressive drugs, initial encounter: Secondary | ICD-10-CM | POA: Insufficient documentation

## 2022-01-20 DIAGNOSIS — K521 Toxic gastroenteritis and colitis: Secondary | ICD-10-CM | POA: Diagnosis not present

## 2022-01-20 DIAGNOSIS — C9211 Chronic myeloid leukemia, BCR/ABL-positive, in remission: Secondary | ICD-10-CM | POA: Diagnosis not present

## 2022-01-20 DIAGNOSIS — Z5111 Encounter for antineoplastic chemotherapy: Secondary | ICD-10-CM

## 2022-01-20 MED ORDER — LOPERAMIDE HCL 2 MG PO CAPS: 2.0000 mg | ORAL_CAPSULE | ORAL | 3 refills | Status: DC

## 2022-01-20 MED ORDER — POTASSIUM CHLORIDE CRYS ER 10 MEQ PO TBCR: 10.0000 meq | EXTENDED_RELEASE_TABLET | Freq: Every day | ORAL | 0 refills | Status: DC

## 2022-01-20 NOTE — Progress Notes (Signed)
Hematology/Oncology Progress note [phone] [phone]         Clinic Day:  01/20/2022  Referring physician: Glean Hess, MD  Chief Complaint: Vali Capano is a 71 y.o. female with chronic phase CML who is seen for management of CML.  PERTINENT ONCOLOGY HISTORY Madalina Rosman is a 71 y.o.afemale who has above oncology history reviewed by me today presented for follow up visit for management of CML Patient previously followed up by Dr.Corcoran, patient switched care to me on 01/03/2021 Extensive medical record review was performed by me  10/09/2020 abnormal peripheral smear (3% myelocytes) 10/25/2020  lab work-up revealed a hematocrit of 40.8, hemoglobin 13.7, platelets 352,000, WBC 10,200. LDH was 174.  BCR-ABL showed 70% of nuclei positive for BCR/ABL1 fusion.  Flow cytometry revealed left shifted aberrant neutrophils, aberrant monocytes and absolute basophilia.   BCR-ABL1 revealed 123.6615% b2a2, < 0.0032% b3a2, and < 0.0032% E1A2 transcript. The ABL1 e13a2 (b2a2, p210) fusion transcript was positive.   11/05/2020 bone marrow biopsy showed  slightly hypercellular marrow with mild granulocytic proliferation and atypical megakaryocytes. The combined bone marrow and peripheral blood findings were very limited but were generally c/w early involvement by chronic myeloid leukemia, chronic phase especially in the presence of previously documented positivity for BCR/ABL1 fusion in peripheral blood analysis.  Cytogenetics revealed 5, XX, t(9;22)(q34.1; q11.2)[19] / 78, XX [1].  Molecular genetics showed a BCR-ABL1 translocation t(9;22) with 65.5684 % BCR-ABL1/ABL1 (IS), major breakpoint p210, log reduction 0.001.  11/16/2020 abdominal ultrasound showed no splenomegaly (7.2 x 9.5 x 4.4 cm).   Relative risk (RR) Sokal 0.81 (intermediate) and [place]ford 684.83 (low) based on age (71), spleen (0), platelets (309), basophils (6%), eosinophils (3%), and myeloblasts (occ-1%). ELTS  risk score 1.5596 (low risk) based on 0% blasts and 1.6648 (intermediate-risk) based on 1% blasts.  11/28/2020, started on Gleevec 400 mg daily.  INTERVAL HISTORY Rheagan Nayak is a 71 y.o. female who has above history reviewed by me today presents for follow up visit for management of CML Problems and complaints are listed below:  Patient has been on Gleevec 400 mg daily.   She continues to have bilateral periorbital edema for which she uses hydrocortisone as needed Intermittent diarrhea, she uses Imodium as needed.  Lately, she has noticed loose bowel movement happens at night which interferes with her sleeping  She was accompanied by her husband.   Past Medical History:  Diagnosis Date   Allergy    Anxiety    Arthritis 2010   Chronic myeloid leukemia, BCR/ABL-positive, not having achieved remission ([place]) 11/17/2020   Depression    Diabetes mellitus without complication ([place])    GERD (gastroesophageal reflux disease)    Hyperlipidemia    Hypertension     Past Surgical History:  Procedure Laterality Date   CYST EXCISION Left 2017   foot   EYE SURGERY  11/16/1996   myopia laser surgery   HAND SURGERY  07/2012   MASS EXCISION Right 08/13/2021   Procedure: EXCISION LOWER LIP MASS;  Surgeon: Clyde Canterbury, MD;  Location: [facility];  Service: ENT;  Laterality: Right;  Diabetic    Family History  Problem Relation Age of Onset   Hypertension Mother    Hyperlipidemia Mother    Arthritis Mother    Parkinsonism Father    Arthritis Other    Hypertension Other    Hyperlipidemia Other    Hypothyroidism Other    Breast cancer Sister 50   Cancer Sister  Social History:  reports that she has never smoked. She has never used smokeless tobacco. She reports that she does not drink alcohol and does not use drugs. She denies tobacco and alcohol use. She denies any exposure to radiation or toxins. . 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 Her daughter and granddaughter live  in [place]. She is originally from [place]. . Her husband is [employer].   Allergies:  Allergies  Allergen Reactions   Clonazepam Other (See Comments)    Nocturnal enuresis; Lethargy    Trazodone Anxiety and Other (See Comments)    Dizziness     Current Medications: Current Outpatient Medications  Medication Sig Dispense Refill   acyclovir (ZOVIRAX) 200 MG capsule Take 1 capsule (200 mg total) by mouth 5 (five) times daily. 90 capsule 1   atenolol (TENORMIN) 50 MG tablet Take 1 tablet (50 mg total) by mouth daily. 90 tablet 1   atorvastatin (LIPITOR) 10 MG tablet Take 1 tablet (10 mg total) by mouth at bedtime. 90 tablet 1   Coenzyme Q10 (CO Q 10) 100 MG CAPS Take 2 capsules by mouth daily.      diphenhydrAMINE (BENADRYL ALLERGY) 25 MG tablet Take 1 tablet (25 mg total) by mouth every 8 (eight) hours as needed. 30 tablet 2   estazolam (PROSOM) 2 MG tablet TAKE 1 TABLET BY MOUTH EVERYDAY AT BEDTIME 30 tablet 2   glucose blood (FREESTYLE LITE) test strip Use to test BS twice daily 100 each 12   hydrochlorothiazide (HYDRODIURIL) 25 MG tablet Take 1 tablet (25 mg total) by mouth daily. 90 tablet 1   hydrocortisone cream 0.5 % Apply 1 application topically 2 (two) times daily. Apply to periorbital area twice daily. 28 g 1   imatinib (GLEEVEC) 400 MG tablet Take 1 tablet (400 mg total) by mouth daily. Take with meals and large glass of water.Caution:Chemotherapy. 90 tablet 1   Lancets (FREESTYLE) lancets Use to test BS once daily 100 each 12   Lutein 20 MG TABS Take 1 tablet by mouth daily.      metFORMIN (GLUCOPHAGE) 1000 MG tablet Take 1 tablet (1,000 mg total) by mouth 2 (two) times daily with a meal. 180 tablet 1   MULTIPLE VITAMIN PO 1 tablet daily.      olopatadine (PATANOL) 0.1 % ophthalmic solution Apply to eye.     Omega-3 Fatty Acids (FISH OIL PEARLS PO) Take by mouth.     omeprazole (PRILOSEC) 20 MG capsule Take 1 capsule (20 mg total) by mouth daily. 90 capsule 1   PARoxetine  (PAXIL) 20 MG tablet Take 1 tablet (20 mg total) by mouth daily. 90 tablet 1   SELENIUM PO Take by mouth daily.     sitaGLIPtin (JANUVIA) 100 MG tablet Take 1 tablet (100 mg total) by mouth daily. 90 tablet 1   triamcinolone cream (KENALOG) 0.5 % APPLY 1 APPLICATION TOPICALLY 3 (THREE) TIMES DAILY. TO RASH ON ABDOMEN 30 g 0   TURMERIC PO Take 800 mg by mouth.     valsartan (DIOVAN) 320 MG tablet Take 1 tablet (320 mg total) by mouth daily. 90 tablet 1   Vitamin D, Ergocalciferol, (DRISDOL) 1.25 MG (50000 UNIT) CAPS capsule Take 50,000 Units by mouth every 7 (seven) days.     vitamin E (VITAMIN E) 400 UNIT capsule Take 1 capsule (400 Units total) by mouth daily. 1 capsule 0   zinc gluconate 50 MG tablet Take 50 mg by mouth daily.     loperamide (  IMODIUM) 2 MG capsule Take 1 capsule (2 mg total) by mouth See admin instructions. Initial: 4 mg, followed by 2 mg after each loose stool; maximum: 16 mg/day 90 capsule 3   potassium chloride (KLOR-CON M) 10 MEQ tablet Take 1 tablet (10 mEq total) by mouth daily. 90 tablet 0   No current facility-administered medications for this visit.    Review of Systems  Constitutional:  Negative for chills, fever, malaise/fatigue and weight loss.  HENT:  Negative for sore throat.   Eyes:  Negative for redness.       Periorbital edema improved with hydrocortisone use.  Respiratory:  Negative for cough, shortness of breath and wheezing.   Cardiovascular:  Negative for chest pain, palpitations and leg swelling.  Gastrointestinal:  Positive for diarrhea. Negative for abdominal pain, blood in stool, nausea and vomiting.  Genitourinary:  Negative for dysuria.  Musculoskeletal:  Positive for joint pain.  Skin:  Negative for rash.  Neurological:  Negative for dizziness, tingling and tremors.  Endo/Heme/Allergies:  Does not bruise/bleed easily.  Psychiatric/Behavioral:  Negative for hallucinations.   Performance status (ECOG): 0 Vitals Blood pressure (!) 164/73,  pulse 72, temperature 97.6 F (36.4 C), resp. rate 18, weight 141 lb 3.2 oz (64 kg).  Physical Exam Constitutional:      General: She is not in acute distress.    Appearance: She is not diaphoretic.  HENT:     Head: Normocephalic and atraumatic.     Nose: Nose normal.     Mouth/Throat:     Pharynx: No oropharyngeal exudate.  Eyes:     General: No scleral icterus.    Pupils: Pupils are equal, round, and reactive to light.  Cardiovascular:     Rate and Rhythm: Normal rate and regular rhythm.     Heart sounds: No murmur heard. Pulmonary:     Effort: Pulmonary effort is normal. No respiratory distress.     Breath sounds: No rales.  Chest:     Chest wall: No tenderness.  Abdominal:     General: There is no distension.     Palpations: Abdomen is soft.     Tenderness: There is no abdominal tenderness.  Musculoskeletal:        General: Normal range of motion.     Cervical back: Normal range of motion and neck supple.  Skin:    General: Skin is warm and dry.     Findings: No erythema.  Neurological:     Mental Status: She is alert and oriented to person, place, and time.     Cranial Nerves: No cranial nerve deficit.     Motor: No abnormal muscle tone.     Coordination: Coordination normal.  Psychiatric:        Mood and Affect: Affect normal.        Assessment and plan.  1. Chronic myeloid leukemia, BCR/ABL-positive, in remission ([place])   2. Encounter for antineoplastic chemotherapy   3. Periorbital swelling   4. Chemotherapy induced diarrhea   5. Hypokalemia     # Chronic myelogenous leukemia-low risk disease [Sokal, has 4, ELTS] Labs reviewed and discussed with patient. BCR ABL FISH negative. BCR ABL QT PCR <0.0032%  Continue Gleevac 400 mg daily.  #Chemotherapy-induced anemia.  Hemoglobin is 11 stable on the monitor. #Periorbital edema, improved after using topical hydrocortisone and hydrochlorothiazide dose increase. Continue current regimen.  #Hemotherapy  induced diarrhea, symptoms are stable.  Continue as needed Imodium, prescriptions were reviewed.  Recommend prophylactic Imodium 2 mg at  bedtime.  #Mild hypocalcemia, continue calcium supplementation 1200 mg daily.  #Hypokalemia, potassium has improved.  3.3.  Continue potassium chloride 10mq daily.  Prescription sent to pharmacy.  Follow-up with me in 3 months   ZEarlie Server MD, PhD [facility] Oncology 01/20/2022

## 2022-03-05 ENCOUNTER — Ambulatory Visit: Payer: Medicare Other | Admitting: Internal Medicine

## 2022-03-05 ENCOUNTER — Other Ambulatory Visit: Payer: Self-pay | Admitting: Internal Medicine

## 2022-03-05 DIAGNOSIS — F5101 Primary insomnia: Secondary | ICD-10-CM

## 2022-03-06 NOTE — Telephone Encounter (Signed)
Requested medication (s) are due for refill today: Yes  Requested medication (s) are on the active medication list: Yes  Last refill:  12/10/21  Future visit scheduled: Yes  Notes to clinic:  See request.    Requested Prescriptions  Pending Prescriptions Disp Refills   estazolam (PROSOM) 2 MG tablet [Pharmacy Med Name: ESTAZOLAM 2 MG TABLET] 30 tablet 2    Sig: TAKE 1 TABLET BY MOUTH EVERYDAY AT BEDTIME     Not Delegated - Psychiatry: Anxiolytics/Hypnotics 2 Failed - 03/05/2022  8:46 AM      Failed - This refill cannot be delegated      Failed - Urine Drug Screen completed in last 360 days      Passed - Patient is not pregnant      Passed - Valid encounter within last 6 months    Recent Outpatient Visits           4 months ago Essential hypertension   [facility] Glean Hess, MD   9 months ago Type II diabetes mellitus with complication [facility])   [facility] Glean Hess, MD   1 year ago Type II diabetes mellitus with complication [facility])   [facility] Glean Hess, MD   1 year ago Essential hypertension   [facility] Glean Hess, MD   1 year ago Type II diabetes mellitus with complication [facility])   [facility] Glean Hess, MD       Future Appointments             Tomorrow Glean Hess, MD [facility], [facility]   In 7 months Army Melia Jesse Sans, MD [facility], [facility]

## 2022-03-06 NOTE — Telephone Encounter (Signed)
Please review.  KP

## 2022-03-07 ENCOUNTER — Ambulatory Visit (INDEPENDENT_AMBULATORY_CARE_PROVIDER_SITE_OTHER): Payer: Medicare Other | Admitting: Internal Medicine

## 2022-03-07 ENCOUNTER — Encounter: Payer: Self-pay | Admitting: Internal Medicine

## 2022-03-07 VITALS — BP 130/72 | HR 76 | Ht 64.0 in | Wt 145.0 lb

## 2022-03-07 DIAGNOSIS — C9210 Chronic myeloid leukemia, BCR/ABL-positive, not having achieved remission: Secondary | ICD-10-CM | POA: Diagnosis not present

## 2022-03-07 DIAGNOSIS — E118 Type 2 diabetes mellitus with unspecified complications: Secondary | ICD-10-CM

## 2022-03-07 DIAGNOSIS — K219 Gastro-esophageal reflux disease without esophagitis: Secondary | ICD-10-CM | POA: Diagnosis not present

## 2022-03-07 DIAGNOSIS — I10 Essential (primary) hypertension: Secondary | ICD-10-CM | POA: Diagnosis not present

## 2022-03-07 LAB — POCT GLYCOSYLATED HEMOGLOBIN (HGB A1C): Hemoglobin A1C: 5.6 % (ref 4.0–5.6)

## 2022-03-07 MED ORDER — OMEPRAZOLE 20 MG PO CPDR: 20.0000 mg | DELAYED_RELEASE_CAPSULE | Freq: Every day | ORAL | 1 refills | Status: DC

## 2022-03-07 NOTE — Progress Notes (Signed)
Date:  03/07/2022   Name:  Jenna Stein   DOB:  September 30, 1950   MRN:  [id number]   Chief Complaint: Diabetes  Diabetes She presents for her follow-up diabetic visit. She has type 2 diabetes mellitus. Her disease course has been stable. Pertinent negatives for hypoglycemia include no headaches, nervousness/anxiousness or tremors. Pertinent negatives for diabetes include no chest pain, no fatigue, no polydipsia and no polyuria. Current diabetic treatments: metformin and Januvia. She is compliant with treatment all of the time. Her weight is stable.  Hypertension This is a chronic problem. The problem is controlled. Pertinent negatives include no chest pain, headaches, palpitations or shortness of breath. Past treatments include diuretics, angiotensin blockers and beta blockers. The current treatment provides significant improvement.  CLL - doing fairly well with Gleevec but having more nocturnal diarrhea and several accidents.  She is now taking imodium qhs to prevent this.  Lab Results  Component Value Date   NA 135 01/06/2022   K 3.3 (L) 01/06/2022   CO2 28 01/06/2022   GLUCOSE 133 (H) 01/06/2022   BUN 15 01/06/2022   CREATININE 1.01 (H) 01/06/2022   CALCIUM 8.6 (L) 01/06/2022   EGFR 69 10/24/2021   GFRNONAA 60 (L) 01/06/2022   Lab Results  Component Value Date   CHOL 115 10/24/2021   HDL 54 10/24/2021   LDLCALC 46 10/24/2021   TRIG 75 10/24/2021   CHOLHDL 2.1 10/24/2021   Lab Results  Component Value Date   TSH 1.860 10/24/2021   Lab Results  Component Value Date   HGBA1C 5.6 03/07/2022   Lab Results  Component Value Date   WBC 5.2 01/06/2022   HGB 11.3 (L) 01/06/2022   HCT 33.0 (L) 01/06/2022   MCV 96.5 01/06/2022   PLT 196 01/06/2022   Lab Results  Component Value Date   ALT 19 01/06/2022   AST 29 01/06/2022   ALKPHOS 40 01/06/2022   BILITOT 0.8 01/06/2022   Lab Results  Component Value Date   VD25OH 29.3 (L) 05/07/2015     Review of Systems   Constitutional:  Negative for appetite change, fatigue, fever and unexpected weight change.  HENT:  Negative for tinnitus and trouble swallowing.   Eyes:  Negative for visual disturbance.  Respiratory:  Negative for cough, chest tightness and shortness of breath.   Cardiovascular:  Negative for chest pain, palpitations and leg swelling.  Gastrointestinal:  Positive for diarrhea. Negative for abdominal pain, nausea and vomiting.  Endocrine: Negative for polydipsia and polyuria.  Genitourinary:  Negative for dysuria and hematuria.  Musculoskeletal:  Negative for arthralgias.  Neurological:  Negative for tremors, numbness and headaches.  Psychiatric/Behavioral:  Positive for sleep disturbance. Negative for dysphoric mood. The patient is not nervous/anxious.     Patient Active Problem List   Diagnosis Date Noted   Chemotherapy-induced neutropenia ([place]) 10/24/2021   Gastroesophageal reflux disease with esophagitis 10/24/2021   Chronic myeloid leukemia, BCR/ABL-positive, not having achieved remission ([place]) 11/17/2020   Abnormal blood smear 10/25/2020   Slow transit constipation 08/13/2018   Type II diabetes mellitus with complication ([place]) 46/96/2952   Dry mouth 10/10/2016   Allergic rhinitis, seasonal 04/30/2015   Hepatic steatosis 04/30/2015   Osteoarthritis of both hands 04/30/2015   Genital herpes 04/04/2009   Depression, major, single episode, in partial remission ([place]) 08/18/1998   Essential hypertension 08/18/1998   Hyperlipidemia associated with type 2 diabetes mellitus ([place]) 08/18/1998   Primary insomnia 08/18/1998    Allergies  Allergen Reactions  Clonazepam Other (See Comments)    Nocturnal enuresis; Lethargy    Trazodone Anxiety and Other (See Comments)    Dizziness     Past Surgical History:  Procedure Laterality Date   CYST EXCISION Left 2017   foot   EYE SURGERY  11/16/1996   myopia laser surgery   HAND SURGERY  07/2012   MASS EXCISION Right 08/13/2021    Procedure: EXCISION LOWER LIP MASS;  Surgeon: Clyde Canterbury, MD;  Location: [facility];  Service: ENT;  Laterality: Right;  Diabetic    Social History   Tobacco Use   Smoking status: Never   Smokeless tobacco: Never   Tobacco comments:    smoking cessation materials not required  Vaping Use   Vaping Use: Never used  Substance Use Topics   Alcohol use: No   Drug use: No     Medication list has been reviewed and updated.  Current Meds  Medication Sig   acyclovir (ZOVIRAX) 200 MG capsule Take 1 capsule (200 mg total) by mouth 5 (five) times daily.   atenolol (TENORMIN) 50 MG tablet Take 1 tablet (50 mg total) by mouth daily.   atorvastatin (LIPITOR) 10 MG tablet Take 1 tablet (10 mg total) by mouth at bedtime.   Coenzyme Q10 (CO Q 10) 100 MG CAPS Take 2 capsules by mouth daily.    estazolam (PROSOM) 2 MG tablet TAKE 1 TABLET BY MOUTH EVERYDAY AT BEDTIME   glucose blood (FREESTYLE LITE) test strip Use to test BS twice daily   hydrochlorothiazide (HYDRODIURIL) 25 MG tablet Take 1 tablet (25 mg total) by mouth daily.   hydrocortisone cream 0.5 % Apply 1 application topically 2 (two) times daily. Apply to periorbital area twice daily.   imatinib (GLEEVEC) 400 MG tablet Take 1 tablet (400 mg total) by mouth daily. Take with meals and large glass of water.Caution:Chemotherapy.   Lancets (FREESTYLE) lancets Use to test BS once daily   loperamide (IMODIUM) 2 MG capsule Take 1 capsule (2 mg total) by mouth See admin instructions. Initial: 4 mg, followed by 2 mg after each loose stool; maximum: 16 mg/day   Lutein 20 MG TABS Take 1 tablet by mouth daily.    metFORMIN (GLUCOPHAGE) 1000 MG tablet Take 1 tablet (1,000 mg total) by mouth 2 (two) times daily with a meal.   MULTIPLE VITAMIN PO 1 tablet daily.    olopatadine (PATANOL) 0.1 % ophthalmic solution Apply to eye.   Omega-3 Fatty Acids (FISH OIL PEARLS PO) Take by mouth.   PARoxetine (PAXIL) 20 MG tablet Take 1 tablet (20 mg  total) by mouth daily.   potassium chloride (KLOR-CON M) 10 MEQ tablet Take 1 tablet (10 mEq total) by mouth daily.   SELENIUM PO Take by mouth daily.   sitaGLIPtin (JANUVIA) 100 MG tablet Take 1 tablet (100 mg total) by mouth daily.   triamcinolone cream (KENALOG) 0.5 % APPLY 1 APPLICATION TOPICALLY 3 (THREE) TIMES DAILY. TO RASH ON ABDOMEN   TURMERIC PO Take 800 mg by mouth.   valsartan (DIOVAN) 320 MG tablet Take 1 tablet (320 mg total) by mouth daily.   Vitamin D, Ergocalciferol, (DRISDOL) 1.25 MG (50000 UNIT) CAPS capsule Take 50,000 Units by mouth every 7 (seven) days.   vitamin E (VITAMIN E) 400 UNIT capsule Take 1 capsule (400 Units total) by mouth daily.   zinc gluconate 50 MG tablet Take 50 mg by mouth daily.   [DISCONTINUED] omeprazole (PRILOSEC) 20 MG capsule Take 1 capsule (20 mg total)  by mouth daily.       03/07/2022    3:44 PM 10/24/2021   10:51 AM 05/24/2021    1:22 PM 01/25/2021    1:41 PM  GAD 7 : Generalized Anxiety Score  Nervous, Anxious, on Edge 1 0 0 0  Control/stop worrying 0 0 0 0  Worry too much - different things 0 0 0 0  Trouble relaxing 0 0 0 0  Restless 0 0 0 0  Easily annoyed or irritable 0 0 0 0  Afraid - awful might happen 0 0 0 0  Total GAD 7 Score 1 0 0 0  Anxiety Difficulty Not difficult at all Not difficult at all Not difficult at all        03/07/2022    3:42 PM 10/24/2021   10:50 AM 09/11/2021   10:58 AM  Depression screen PHQ 2/9  Decreased Interest 0 0 0  Down, Depressed, Hopeless 1 0 0  PHQ - 2 Score 1 0 0  Altered sleeping 0 3   Tired, decreased energy 0 3   Change in appetite 2 0   Feeling bad or failure about yourself  0 0   Trouble concentrating 0 0   Moving slowly or fidgety/restless 0 0   Suicidal thoughts 0 0   PHQ-9 Score 3 6   Difficult doing work/chores Not difficult at all Not difficult at all     BP Readings from Last 3 Encounters:  03/07/22 130/72  01/20/22 (!) 164/73  10/24/21 138/70    Physical Exam Vitals and  nursing note reviewed.  Constitutional:      General: She is not in acute distress.    Appearance: She is well-developed.  HENT:     Head: Normocephalic and atraumatic.  Cardiovascular:     Rate and Rhythm: Normal rate and regular rhythm.  Pulmonary:     Effort: Pulmonary effort is normal. No respiratory distress.     Breath sounds: No wheezing or rhonchi.  Musculoskeletal:     Right lower leg: Edema (trace edema) present.     Left lower leg: Edema (trace edema) present.  Lymphadenopathy:     Cervical: No cervical adenopathy.  Skin:    General: Skin is warm and dry.     Capillary Refill: Capillary refill takes less than 2 seconds.     Findings: No rash.  Neurological:     Mental Status: She is alert and oriented to person, place, and time.  Psychiatric:        Mood and Affect: Mood normal.        Behavior: Behavior normal.     Wt Readings from Last 3 Encounters:  03/07/22 145 lb (65.8 kg)  01/20/22 141 lb 3.2 oz (64 kg)  10/24/21 139 lb (63 kg)    BP 130/72   Pulse 76   Ht _0  (1.626 m)   Wt 145 lb (65.8 kg)   SpO2 97%   BMI 24.89 kg/m   Assessment and Plan: 1. Type II diabetes mellitus with complication (HCC) Clinically stable by exam and report without s/s of hypoglycemia. DM complicated by hypertension and dyslipidemia. Tolerating medications well without side effects or other concerns.  Continue Metformin. - POCT glycosylated hemoglobin (Hb A1C)= 5.6 up from 5.5  2. Essential hypertension Clinically stable exam with well controlled BP. Tolerating medications without side effects at this time. Pt to continue current regimen and low sodium diet; benefits of regular exercise as able discussed.  3. Chronic myeloid leukemia,  BCR/ABL-positive, not having achieved remission ([place]) [place] with diarrhea Monitored by Oncology  4. Gastroesophageal reflux disease, unspecified whether esophagitis present Symptoms well controlled on daily PPI No red flag signs  such as weight loss, n/v, melena Will continue omeprazole - omeprazole (PRILOSEC) 20 MG capsule; Take 1 capsule (20 mg total) by mouth daily.  Dispense: 90 capsule; Refill: 1   Partially dictated using Editor, commissioning. Any errors are unintentional.  Halina Maidens, MD [facility]  03/07/2022

## 2022-03-12 ENCOUNTER — Encounter: Payer: Self-pay | Admitting: Internal Medicine

## 2022-04-13 ENCOUNTER — Other Ambulatory Visit: Payer: Self-pay | Admitting: Internal Medicine

## 2022-04-13 ENCOUNTER — Encounter: Payer: Self-pay | Admitting: Internal Medicine

## 2022-04-13 DIAGNOSIS — E1169 Type 2 diabetes mellitus with other specified complication: Secondary | ICD-10-CM

## 2022-04-13 DIAGNOSIS — I10 Essential (primary) hypertension: Secondary | ICD-10-CM

## 2022-04-13 DIAGNOSIS — E118 Type 2 diabetes mellitus with unspecified complications: Secondary | ICD-10-CM

## 2022-04-13 DIAGNOSIS — A6000 Herpesviral infection of urogenital system, unspecified: Secondary | ICD-10-CM

## 2022-04-13 DIAGNOSIS — F324 Major depressive disorder, single episode, in partial remission: Secondary | ICD-10-CM

## 2022-04-13 MED ORDER — METFORMIN HCL 1000 MG PO TABS: 1000.0000 mg | ORAL_TABLET | Freq: Two times a day (BID) | ORAL | 1 refills | Status: DC

## 2022-04-13 MED ORDER — SITAGLIPTIN PHOSPHATE 100 MG PO TABS: 100.0000 mg | ORAL_TABLET | Freq: Every day | ORAL | 1 refills | Status: DC

## 2022-04-13 MED ORDER — ATENOLOL 50 MG PO TABS: 50.0000 mg | ORAL_TABLET | Freq: Every day | ORAL | 1 refills | Status: DC

## 2022-04-13 MED ORDER — ATORVASTATIN CALCIUM 10 MG PO TABS: 10.0000 mg | ORAL_TABLET | Freq: Every day | ORAL | 1 refills | Status: DC

## 2022-04-13 MED ORDER — PAROXETINE HCL 20 MG PO TABS: 20.0000 mg | ORAL_TABLET | Freq: Every day | ORAL | 1 refills | Status: DC

## 2022-04-13 MED ORDER — ACYCLOVIR 200 MG PO CAPS: 200.0000 mg | ORAL_CAPSULE | Freq: Every day | ORAL | 1 refills | Status: DC

## 2022-04-13 MED ORDER — HYDROCHLOROTHIAZIDE 25 MG PO TABS: 25.0000 mg | ORAL_TABLET | Freq: Every day | ORAL | 1 refills | Status: DC

## 2022-04-13 MED ORDER — VALSARTAN 320 MG PO TABS: 320.0000 mg | ORAL_TABLET | Freq: Every day | ORAL | 1 refills | Status: DC

## 2022-04-22 ENCOUNTER — Inpatient Hospital Stay: Payer: Medicare Other | Attending: Oncology

## 2022-04-22 DIAGNOSIS — C9221 Atypical chronic myeloid leukemia, BCR/ABL-negative, in remission: Secondary | ICD-10-CM | POA: Insufficient documentation

## 2022-04-22 DIAGNOSIS — C9211 Chronic myeloid leukemia, BCR/ABL-positive, in remission: Secondary | ICD-10-CM

## 2022-04-22 DIAGNOSIS — D6481 Anemia due to antineoplastic chemotherapy: Secondary | ICD-10-CM | POA: Diagnosis not present

## 2022-04-22 LAB — COMPREHENSIVE METABOLIC PANEL
ALT: 18 U/L (ref 0–44)
AST: 28 U/L (ref 15–41)
Albumin: 3.9 g/dL (ref 3.5–5.0)
Alkaline Phosphatase: 42 U/L (ref 38–126)
Anion gap: 9 (ref 5–15)
BUN: 13 mg/dL (ref 8–23)
CO2: 26 mmol/L (ref 22–32)
Calcium: 9.1 mg/dL (ref 8.9–10.3)
Chloride: 103 mmol/L (ref 98–111)
Creatinine, Ser: 0.95 mg/dL (ref 0.44–1.00)
GFR, Estimated: >60 mL/min (ref >60)
Glucose, Bld: 136 mg/dL — ABNORMAL HIGH (ref 70–99)
Potassium: 3.4 mmol/L — ABNORMAL LOW (ref 3.5–5.1)
Sodium: 138 mmol/L (ref 135–145)
Total Bilirubin: 0.5 mg/dL (ref 0.3–1.2)
Total Protein: 6.2 g/dL — ABNORMAL LOW (ref 6.5–8.1)

## 2022-04-22 LAB — CBC WITH DIFFERENTIAL/PLATELET
Abs Immature Granulocytes: 0.01 10*3/uL (ref 0.00–0.07)
Basophils Absolute: 0.1 10*3/uL (ref 0.0–0.1)
Basophils Relative: 1 %
Eosinophils Absolute: 0.3 10*3/uL (ref 0.0–0.5)
Eosinophils Relative: 9 %
HCT: 29.9 % — ABNORMAL LOW (ref 36.0–46.0)
Hemoglobin: 10.5 g/dL — ABNORMAL LOW (ref 12.0–15.0)
Immature Granulocytes: 0 %
Lymphocytes Relative: 41 %
Lymphs Abs: 1.5 10*3/uL (ref 0.7–4.0)
MCH: 32.8 pg (ref 26.0–34.0)
MCHC: 35.1 g/dL (ref 30.0–36.0)
MCV: 93.4 fL (ref 80.0–100.0)
Monocytes Absolute: 0.2 10*3/uL (ref 0.1–1.0)
Monocytes Relative: 6 %
Neutro Abs: 1.6 10*3/uL — ABNORMAL LOW (ref 1.7–7.7)
Neutrophils Relative %: 43 %
Platelets: 174 10*3/uL (ref 150–400)
RBC: 3.2 MIL/uL — ABNORMAL LOW (ref 3.87–5.11)
RDW: 13.2 % (ref 11.5–15.5)
WBC: 3.7 10*3/uL — ABNORMAL LOW (ref 4.0–10.5)
nRBC: 0 % (ref 0.0–0.2)

## 2022-04-25 LAB — BCR-ABL1 FISH
Cells Analyzed: 200
Cells Counted: 200

## 2022-04-29 LAB — BCR-ABL1, CML/ALL, PCR, QUANT: Interpretation (BCRAL):: NEGATIVE

## 2022-05-06 ENCOUNTER — Encounter: Payer: Self-pay | Admitting: Oncology

## 2022-05-06 ENCOUNTER — Inpatient Hospital Stay (HOSPITAL_BASED_OUTPATIENT_CLINIC_OR_DEPARTMENT_OTHER): Payer: Medicare Other | Admitting: Oncology

## 2022-05-06 DIAGNOSIS — R600 Localized edema: Secondary | ICD-10-CM | POA: Insufficient documentation

## 2022-05-06 DIAGNOSIS — E876 Hypokalemia: Secondary | ICD-10-CM | POA: Diagnosis not present

## 2022-05-06 DIAGNOSIS — C9221 Atypical chronic myeloid leukemia, BCR/ABL-negative, in remission: Secondary | ICD-10-CM | POA: Diagnosis not present

## 2022-05-06 DIAGNOSIS — C9211 Chronic myeloid leukemia, BCR/ABL-positive, in remission: Secondary | ICD-10-CM

## 2022-05-06 DIAGNOSIS — K521 Toxic gastroenteritis and colitis: Secondary | ICD-10-CM | POA: Diagnosis not present

## 2022-05-06 DIAGNOSIS — D6481 Anemia due to antineoplastic chemotherapy: Secondary | ICD-10-CM | POA: Diagnosis not present

## 2022-05-06 DIAGNOSIS — Z5111 Encounter for antineoplastic chemotherapy: Secondary | ICD-10-CM | POA: Insufficient documentation

## 2022-05-06 DIAGNOSIS — T451X5A Adverse effect of antineoplastic and immunosuppressive drugs, initial encounter: Secondary | ICD-10-CM | POA: Diagnosis not present

## 2022-05-06 HISTORY — DX: Adverse effect of antineoplastic and immunosuppressive drugs, initial encounter: D64.81

## 2022-05-06 MED ORDER — IMATINIB MESYLATE 400 MG PO TABS: 400.0000 mg | ORAL_TABLET | Freq: Every day | ORAL | 1 refills | Status: DC

## 2022-05-06 MED ORDER — POTASSIUM CHLORIDE CRYS ER 10 MEQ PO TBCR: 10.0000 meq | EXTENDED_RELEASE_TABLET | Freq: Every day | ORAL | 0 refills | Status: DC

## 2022-05-06 NOTE — Assessment & Plan Note (Addendum)
Chronic myelogenous leukemia-low risk disease [Sokal, has 4, ELTS] Labs reviewed and discussed with patient. BCR ABL FISH negative. BCR ABL QT PCR <0.0032%  Continue Gleevac 400 mg daily.  Refilled prescription  #Angular cheilitis, likely secondary to [place].  Mild symptoms.  Continue monitor.  If severe symptoms, she may use topical corticosteroids as needed. #Mild neutropenia, repeat CBC in 4 weeks

## 2022-05-06 NOTE — Progress Notes (Signed)
Patient complains of foot painful to touch and occasional diarrhea

## 2022-05-06 NOTE — Progress Notes (Signed)
Hematology/Oncology Progress note [phone] [phone]         Clinic Day:  05/06/2022 ASSESSMENT & PLAN:   BCR/ABL1-positive chronic myeloid leukemia (CML) in remission (HCC) Chronic myelogenous leukemia-low risk disease [Sokal, has 4, ELTS] Labs reviewed and discussed with patient. BCR ABL FISH negative. BCR ABL QT PCR <0.0032%  Continue Gleevac 400 mg daily.  Refilled prescription  #Angular cheilitis, likely secondary to [place].  Mild symptoms.  Continue monitor.  If severe symptoms, she may use topical corticosteroids as needed. #Mild neutropenia, repeat CBC in 4 weeks  Encounter for antineoplastic chemotherapy Treatment as planned above  Periorbital edema of both eyes Periorbital edema, recommend topical hydrocortisone PRN Chemosis, also likely ocular side effects from imatinib.  Manageable.  Chemotherapy induced diarrhea symptoms are stable.  Continue as needed Imodium, prescriptions were reviewed.  Recommend prophylactic Imodium 2 mg at bedtime. Refill prescription  Anemia due to antineoplastic chemotherapy Hemoglobin stable.  Continue monitor  Hypokalemia Continue potassium chloride 77mq daily.  Prescription sent to pharmacy.  Orders Placed This Encounter  Procedures   CBC with Differential/Platelet    Standing Status:   Future    Standing Expiration Date:   05/07/2023   CBC with Differential/Platelet    Standing Status:   Future    Standing Expiration Date:   05/07/2023   Comprehensive metabolic panel    Standing Status:   Future    Standing Expiration Date:   05/06/2023   BCR-ABL1 FISH    Standing Status:   Future    Standing Expiration Date:   05/07/2023   BCR-ABL1, CML/ALL, PCR, QUANT    Standing Status:   Future    Standing Expiration Date:   05/07/2023   Follow-up in 3 months. All questions were answered. The patient knows to call the clinic with any problems, questions or concerns.  ZEarlie Server MD, PhD [facility]  Hematology Oncology 05/06/2022    Chief Complaint: Jenna Herlingis a 71y.o. female with chronic phase CML who is seen for management of CML.  PERTINENT ONCOLOGY HISTORY LNiah Heinleis a 71y.o.afemale who has above oncology history reviewed by me today presented for follow up visit for management of CML Patient previously followed up by Dr.Corcoran, patient switched care to me on 01/03/2021 Extensive medical record review was performed by me  10/09/2020 abnormal peripheral smear (3% myelocytes) 10/25/2020  lab work-up revealed a hematocrit of 40.8, hemoglobin 13.7, platelets 352,000, WBC 10,200. LDH was 174.  BCR-ABL showed 70% of nuclei positive for BCR/ABL1 fusion.  Flow cytometry revealed left shifted aberrant neutrophils, aberrant monocytes and absolute basophilia.   BCR-ABL1 revealed 123.6615% b2a2, < 0.0032% b3a2, and < 0.0032% E1A2 transcript. The ABL1 e13a2 (b2a2, p210) fusion transcript was positive.   11/05/2020 bone marrow biopsy showed  slightly hypercellular marrow with mild granulocytic proliferation and atypical megakaryocytes. The combined bone marrow and peripheral blood findings were very limited but were generally c/w early involvement by chronic myeloid leukemia, chronic phase especially in the presence of previously documented positivity for BCR/ABL1 fusion in peripheral blood analysis.  Cytogenetics revealed 463 XX, t(9;22)(q34.1; q11.2)[19] / 429 XX [1].  Molecular genetics showed a BCR-ABL1 translocation t(9;22) with 65.5684 % BCR-ABL1/ABL1 (IS), major breakpoint p210, log reduction 0.001.  11/16/2020 abdominal ultrasound showed no splenomegaly (7.2 x 9.5 x 4.4 cm).   Relative risk (RR) Sokal 0.81 (intermediate) and [place] 684.83 (low) based on age ((33, spleen (0), platelets (309), basophils (6%), eosinophils (3%), and myeloblasts (occ-1%). ELTS risk score  1.5596 (low risk) based on 0% blasts and 1.6648 (intermediate-risk) based on 1% blasts.  11/28/2020, started on  Gleevec 400 mg daily.  INTERVAL HISTORY Jenna Stein is a 71 y.o. female who has above history reviewed by me today presents for follow up visit for management of CML Problems and complaints are listed below:  Patient has been on Gleevec 400 mg daily.   Overall she tolerates well with manageable toxicities.  She continues to have bilateral periorbital edema for which she uses hydrocortisone as needed + She has subconjunctival hemorrhage/ bulbar conjunctiva +Intermittent diarrhea, she uses Imodium as needed.  Requesting refill.   She was accompanied by her husband.   Past Medical History:  Diagnosis Date   Allergy    Anxiety    Arthritis 2010   Chronic myeloid leukemia, BCR/ABL-positive, not having achieved remission ([place]) 11/17/2020   Depression    Diabetes mellitus without complication ([place])    GERD (gastroesophageal reflux disease)    Hyperlipidemia    Hypertension     Past Surgical History:  Procedure Laterality Date   CYST EXCISION Left 2017   foot   EYE SURGERY  11/16/1996   myopia laser surgery   HAND SURGERY  07/2012   MASS EXCISION Right 08/13/2021   Procedure: EXCISION LOWER LIP MASS;  Surgeon: Clyde Canterbury, MD;  Location: [facility];  Service: ENT;  Laterality: Right;  Diabetic    Family History  Problem Relation Age of Onset   Hypertension Mother    Hyperlipidemia Mother    Arthritis Mother    Parkinsonism Father    Arthritis Other    Hypertension Other    Hyperlipidemia Other    Hypothyroidism Other    Breast cancer Sister 63   Cancer Sister     Social History:  reports that she has never smoked. She has never used smokeless tobacco. She reports that she does not drink alcohol and does not use drugs.   Allergies:  Allergies  Allergen Reactions   Clonazepam Other (See Comments)    Nocturnal enuresis; Lethargy    Trazodone Anxiety and Other (See Comments)    Dizziness     Current Medications: Current Outpatient Medications   Medication Sig Dispense Refill   acyclovir (ZOVIRAX) 200 MG capsule Take 1 capsule (200 mg total) by mouth 5 (five) times daily. 90 capsule 1   atenolol (TENORMIN) 50 MG tablet Take 1 tablet (50 mg total) by mouth daily. 90 tablet 1   atorvastatin (LIPITOR) 10 MG tablet Take 1 tablet (10 mg total) by mouth at bedtime. 90 tablet 1   Coenzyme Q10 (CO Q 10) 100 MG CAPS Take 2 capsules by mouth daily.      estazolam (PROSOM) 2 MG tablet TAKE 1 TABLET BY MOUTH EVERYDAY AT BEDTIME 30 tablet 5   glucose blood (FREESTYLE LITE) test strip Use to test BS twice daily 100 each 12   hydrochlorothiazide (HYDRODIURIL) 25 MG tablet Take 1 tablet (25 mg total) by mouth daily. 90 tablet 1   hydrocortisone cream 0.5 % Apply 1 application topically 2 (two) times daily. Apply to periorbital area twice daily. 28 g 1   imatinib (GLEEVEC) 400 MG tablet Take 1 tablet (400 mg total) by mouth daily. Take with meals and large glass of water.Caution:Chemotherapy. 90 tablet 1   Lancets (FREESTYLE) lancets Use to test BS once daily 100 each 12   loperamide (IMODIUM) 2 MG capsule Take 1 capsule (2 mg total) by mouth See admin instructions. Initial: 4  mg, followed by 2 mg after each loose stool; maximum: 16 mg/day (Patient not taking: Reported on 05/06/2022) 90 capsule 3   Lutein 20 MG TABS Take 1 tablet by mouth daily.      metFORMIN (GLUCOPHAGE) 1000 MG tablet Take 1 tablet (1,000 mg total) by mouth 2 (two) times daily with a meal. 180 tablet 1   MULTIPLE VITAMIN PO 1 tablet daily.      olopatadine (PATANOL) 0.1 % ophthalmic solution Apply to eye.     Omega-3 Fatty Acids (FISH OIL PEARLS PO) Take by mouth.     omeprazole (PRILOSEC) 20 MG capsule Take 1 capsule (20 mg total) by mouth daily. 90 capsule 1   PARoxetine (PAXIL) 20 MG tablet Take 1 tablet (20 mg total) by mouth daily. 90 tablet 1   potassium chloride (KLOR-CON M) 10 MEQ tablet Take 1 tablet (10 mEq total) by mouth daily. 90 tablet 0   SELENIUM PO Take by mouth  daily.     sitaGLIPtin (JANUVIA) 100 MG tablet Take 1 tablet (100 mg total) by mouth daily. 90 tablet 1   triamcinolone cream (KENALOG) 0.5 % APPLY 1 APPLICATION TOPICALLY 3 (THREE) TIMES DAILY. TO RASH ON ABDOMEN 30 g 0   TURMERIC PO Take 800 mg by mouth.     valsartan (DIOVAN) 320 MG tablet Take 1 tablet (320 mg total) by mouth daily. 90 tablet 1   Vitamin D, Ergocalciferol, (DRISDOL) 1.25 MG (50000 UNIT) CAPS capsule Take 50,000 Units by mouth every 7 (seven) days.     vitamin E (VITAMIN E) 400 UNIT capsule Take 1 capsule (400 Units total) by mouth daily. 1 capsule 0   zinc gluconate 50 MG tablet Take 50 mg by mouth daily.     No current facility-administered medications for this visit.    Review of Systems  Constitutional:  Negative for chills, fever, malaise/fatigue and weight loss.  HENT:  Negative for sore throat.   Eyes:  Negative for redness.       Periorbital edema, occasional subconjunctival hemorrhage and bulbar conjunctivae  Respiratory:  Negative for cough, shortness of breath and wheezing.   Cardiovascular:  Negative for chest pain, palpitations and leg swelling.  Gastrointestinal:  Positive for diarrhea. Negative for abdominal pain, blood in stool, nausea and vomiting.  Genitourinary:  Negative for dysuria.  Musculoskeletal:  Positive for joint pain.  Skin:  Negative for rash.  Neurological:  Negative for dizziness, tingling and tremors.  Endo/Heme/Allergies:  Does not bruise/bleed easily.  Psychiatric/Behavioral:  Negative for hallucinations.    Performance status (ECOG): 0 Vitals Blood pressure (!) 157/84, pulse 95, resp. rate 18, height '5\' 4"'  (1.626 m), weight 139 lb (63 kg), SpO2 99 %.  Physical Exam Constitutional:      General: She is not in acute distress.    Appearance: She is not diaphoretic.  HENT:     Head: Normocephalic and atraumatic.     Nose: Nose normal.     Mouth/Throat:     Pharynx: No oropharyngeal exudate.  Eyes:     General: No scleral  icterus.    Pupils: Pupils are equal, round, and reactive to light.  Cardiovascular:     Rate and Rhythm: Normal rate and regular rhythm.     Heart sounds: No murmur heard. Pulmonary:     Effort: Pulmonary effort is normal. No respiratory distress.     Breath sounds: No rales.  Chest:     Chest wall: No tenderness.  Abdominal:  General: There is no distension.     Palpations: Abdomen is soft.     Tenderness: There is no abdominal tenderness.  Musculoskeletal:        General: Normal range of motion.     Cervical back: Normal range of motion and neck supple.  Skin:    General: Skin is warm and dry.     Findings: No erythema.  Neurological:     Mental Status: She is alert and oriented to person, place, and time.     Cranial Nerves: No cranial nerve deficit.     Motor: No abnormal muscle tone.     Coordination: Coordination normal.  Psychiatric:        Mood and Affect: Affect normal.

## 2022-05-06 NOTE — Assessment & Plan Note (Signed)
Hemoglobin stable.  Continue monitor. 

## 2022-05-06 NOTE — Assessment & Plan Note (Addendum)
symptoms are stable.  Continue as needed Imodium, prescriptions were reviewed.  Recommend prophylactic Imodium 2 mg at bedtime. Refill prescription

## 2022-05-06 NOTE — Assessment & Plan Note (Signed)
Periorbital edema, recommend topical hydrocortisone PRN Chemosis, also likely ocular side effects from imatinib.  Manageable.

## 2022-05-06 NOTE — Assessment & Plan Note (Signed)
Continue potassium chloride 10meq daily.  Prescription sent to pharmacy. 

## 2022-05-06 NOTE — Assessment & Plan Note (Signed)
Treatment as planned above. 

## 2022-05-23 DIAGNOSIS — H11441 Conjunctival cysts, right eye: Secondary | ICD-10-CM | POA: Diagnosis not present

## 2022-06-03 ENCOUNTER — Inpatient Hospital Stay: Payer: Medicare Other | Attending: Oncology

## 2022-06-03 DIAGNOSIS — C9211 Chronic myeloid leukemia, BCR/ABL-positive, in remission: Secondary | ICD-10-CM | POA: Diagnosis not present

## 2022-06-03 LAB — CBC WITH DIFFERENTIAL/PLATELET
Abs Immature Granulocytes: 0.01 10*3/uL (ref 0.00–0.07)
Basophils Absolute: 0.1 10*3/uL (ref 0.0–0.1)
Basophils Relative: 1 %
Eosinophils Absolute: 0.3 10*3/uL (ref 0.0–0.5)
Eosinophils Relative: 8 %
HCT: 32.2 % — ABNORMAL LOW (ref 36.0–46.0)
Hemoglobin: 11.2 g/dL — ABNORMAL LOW (ref 12.0–15.0)
Immature Granulocytes: 0 %
Lymphocytes Relative: 45 %
Lymphs Abs: 1.7 10*3/uL (ref 0.7–4.0)
MCH: 32.7 pg (ref 26.0–34.0)
MCHC: 34.8 g/dL (ref 30.0–36.0)
MCV: 93.9 fL (ref 80.0–100.0)
Monocytes Absolute: 0.3 10*3/uL (ref 0.1–1.0)
Monocytes Relative: 8 %
Neutro Abs: 1.5 10*3/uL — ABNORMAL LOW (ref 1.7–7.7)
Neutrophils Relative %: 38 %
Platelets: 188 10*3/uL (ref 150–400)
RBC: 3.43 MIL/uL — ABNORMAL LOW (ref 3.87–5.11)
RDW: 13.2 % (ref 11.5–15.5)
WBC: 3.9 10*3/uL — ABNORMAL LOW (ref 4.0–10.5)
nRBC: 0 % (ref 0.0–0.2)

## 2022-07-08 ENCOUNTER — Ambulatory Visit (INDEPENDENT_AMBULATORY_CARE_PROVIDER_SITE_OTHER): Payer: Medicare Other | Admitting: Internal Medicine

## 2022-07-08 ENCOUNTER — Encounter: Payer: Self-pay | Admitting: Internal Medicine

## 2022-07-08 VITALS — BP 136/82 | HR 73 | Ht 64.0 in | Wt 135.0 lb

## 2022-07-08 DIAGNOSIS — E1169 Type 2 diabetes mellitus with other specified complication: Secondary | ICD-10-CM

## 2022-07-08 DIAGNOSIS — F324 Major depressive disorder, single episode, in partial remission: Secondary | ICD-10-CM | POA: Diagnosis not present

## 2022-07-08 DIAGNOSIS — E785 Hyperlipidemia, unspecified: Secondary | ICD-10-CM | POA: Diagnosis not present

## 2022-07-08 DIAGNOSIS — L309 Dermatitis, unspecified: Secondary | ICD-10-CM | POA: Diagnosis not present

## 2022-07-08 DIAGNOSIS — I10 Essential (primary) hypertension: Secondary | ICD-10-CM

## 2022-07-08 DIAGNOSIS — E118 Type 2 diabetes mellitus with unspecified complications: Secondary | ICD-10-CM | POA: Diagnosis not present

## 2022-07-08 DIAGNOSIS — F5101 Primary insomnia: Secondary | ICD-10-CM

## 2022-07-08 DIAGNOSIS — C9211 Chronic myeloid leukemia, BCR/ABL-positive, in remission: Secondary | ICD-10-CM

## 2022-07-08 LAB — POCT GLYCOSYLATED HEMOGLOBIN (HGB A1C): Hemoglobin A1C: 5.6 % (ref 4.0–5.6)

## 2022-07-08 MED ORDER — ONDANSETRON HCL 4 MG PO TABS: 4.0000 mg | ORAL_TABLET | Freq: Three times a day (TID) | ORAL | 0 refills | Status: DC | PRN

## 2022-07-08 MED ORDER — AMLODIPINE BESYLATE 5 MG PO TABS: 5.0000 mg | ORAL_TABLET | Freq: Every day | ORAL | 0 refills | Status: DC

## 2022-07-08 MED ORDER — MIRTAZAPINE 15 MG PO TABS: 15.0000 mg | ORAL_TABLET | Freq: Every day | ORAL | 1 refills | Status: DC

## 2022-07-08 NOTE — Progress Notes (Signed)
Date:  07/08/2022   Name:  Jenna Stein   DOB:  February 23, 1951   MRN:  [id number]   Chief Complaint: Diabetes and Hypertension  Diabetes She presents for her follow-up diabetic visit. She has type 2 diabetes mellitus. Her disease course has been stable. Pertinent negatives for hypoglycemia include no nervousness/anxiousness. Associated symptoms include fatigue. Pertinent negatives for diabetes include no chest pain. Symptoms are stable. Current diabetic treatment includes oral agent (dual therapy) (metformin and Januvia). She is compliant with treatment all of the time.  Hypertension This is a chronic problem. The problem is controlled. Pertinent negatives include no chest pain, palpitations or shortness of breath. Past treatments include beta blockers, diuretics and angiotensin blockers.  Hyperlipidemia This is a chronic problem. The problem is controlled. Pertinent negatives include no chest pain or shortness of breath. Current antihyperlipidemic treatment includes statins.  CLL- followed and treated by oncology.  Currently in remission and doing well.  Lab Results  Component Value Date   NA 138 04/22/2022   K 3.4 (L) 04/22/2022   CO2 26 04/22/2022   GLUCOSE 136 (H) 04/22/2022   BUN 13 04/22/2022   CREATININE 0.95 04/22/2022   CALCIUM 9.1 04/22/2022   EGFR 69 10/24/2021   GFRNONAA >60 04/22/2022   Lab Results  Component Value Date   CHOL 115 10/24/2021   HDL 54 10/24/2021   LDLCALC 46 10/24/2021   TRIG 75 10/24/2021   CHOLHDL 2.1 10/24/2021   Lab Results  Component Value Date   TSH 1.860 10/24/2021   Lab Results  Component Value Date   HGBA1C 5.6 07/08/2022   Lab Results  Component Value Date   WBC 3.9 (L) 06/03/2022   HGB 11.2 (L) 06/03/2022   HCT 32.2 (L) 06/03/2022   MCV 93.9 06/03/2022   PLT 188 06/03/2022   Lab Results  Component Value Date   ALT 18 04/22/2022   AST 28 04/22/2022   ALKPHOS 42 04/22/2022   BILITOT 0.5 04/22/2022   Lab Results   Component Value Date   VD25OH 29.3 (L) 05/07/2015     Review of Systems  Constitutional:  Positive for fatigue. Negative for chills, diaphoresis and fever.  Respiratory:  Negative for cough, chest tightness, shortness of breath and wheezing.   Cardiovascular:  Negative for chest pain, palpitations and leg swelling.  Gastrointestinal:  Negative for abdominal pain.  Musculoskeletal:  Negative for arthralgias.  Skin:  Positive for rash (dry red skin on arm, neck) and wound (from her parrot or minor trauma).  Psychiatric/Behavioral:  Positive for sleep disturbance. Negative for dysphoric mood. The patient is not nervous/anxious.     Patient Active Problem List   Diagnosis Date Noted   Encounter for antineoplastic chemotherapy 05/06/2022   Periorbital edema of both eyes 05/06/2022   Chemotherapy induced diarrhea 05/06/2022   Anemia due to antineoplastic chemotherapy 05/06/2022   Hypokalemia 05/06/2022   Chemotherapy-induced neutropenia ([place]) 10/24/2021   Gastroesophageal reflux disease with esophagitis 10/24/2021   BCR/ABL1-positive chronic myeloid leukemia (CML) in remission ([place]) 11/17/2020   Abnormal blood smear 10/25/2020   Type II diabetes mellitus with complication ([place]) 39/10/90   Dry mouth 10/10/2016   Allergic rhinitis, seasonal 04/30/2015   Hepatic steatosis 04/30/2015   Osteoarthritis of both hands 04/30/2015   Genital herpes 04/04/2009   Depression, major, single episode, in partial remission ([place]) 08/18/1998   Essential hypertension 08/18/1998   Hyperlipidemia associated with type 2 diabetes mellitus ([place]) 08/18/1998   Primary insomnia 08/18/1998    Allergies  Allergen Reactions   Clonazepam Other (See Comments)    Nocturnal enuresis; Lethargy    Trazodone Anxiety and Other (See Comments)    Dizziness     Past Surgical History:  Procedure Laterality Date   CYST EXCISION Left 2017   foot   EYE SURGERY  11/16/1996   myopia laser surgery   HAND SURGERY   07/2012   MASS EXCISION Right 08/13/2021   Procedure: EXCISION LOWER LIP MASS;  Surgeon: Clyde Canterbury, MD;  Location: [facility];  Service: ENT;  Laterality: Right;  Diabetic    Social History   Tobacco Use   Smoking status: Never   Smokeless tobacco: Never   Tobacco comments:    smoking cessation materials not required  Vaping Use   Vaping Use: Never used  Substance Use Topics   Alcohol use: No   Drug use: No     Medication list has been reviewed and updated.  Current Meds  Medication Sig   acyclovir (ZOVIRAX) 200 MG capsule Take 1 capsule (200 mg total) by mouth 5 (five) times daily.   amLODipine (NORVASC) 5 MG tablet Take 1 tablet (5 mg total) by mouth daily.   atenolol (TENORMIN) 50 MG tablet Take 1 tablet (50 mg total) by mouth daily.   atorvastatin (LIPITOR) 10 MG tablet Take 1 tablet (10 mg total) by mouth at bedtime.   Coenzyme Q10 (CO Q 10) 100 MG CAPS Take 2 capsules by mouth daily.    estazolam (PROSOM) 2 MG tablet TAKE 1 TABLET BY MOUTH EVERYDAY AT BEDTIME   glucose blood (FREESTYLE LITE) test strip Use to test BS twice daily   hydrochlorothiazide (HYDRODIURIL) 25 MG tablet Take 1 tablet (25 mg total) by mouth daily.   hydrocortisone cream 0.5 % Apply 1 application topically 2 (two) times daily. Apply to periorbital area twice daily.   imatinib (GLEEVEC) 400 MG tablet Take 1 tablet (400 mg total) by mouth daily. Take with meals and large glass of water.Caution:Chemotherapy.   Lancets (FREESTYLE) lancets Use to test BS once daily   Lutein 20 MG TABS Take 1 tablet by mouth daily.    metFORMIN (GLUCOPHAGE) 1000 MG tablet Take 1 tablet (1,000 mg total) by mouth 2 (two) times daily with a meal.   mirtazapine (REMERON) 15 MG tablet Take 1 tablet (15 mg total) by mouth at bedtime.   MULTIPLE VITAMIN PO 1 tablet daily.    olopatadine (PATANOL) 0.1 % ophthalmic solution Apply to eye.   Omega-3 Fatty Acids (FISH OIL PEARLS PO) Take by mouth.   omeprazole  (PRILOSEC) 20 MG capsule Take 1 capsule (20 mg total) by mouth daily.   ondansetron (ZOFRAN) 4 MG tablet Take 1 tablet (4 mg total) by mouth every 8 (eight) hours as needed for nausea or vomiting.   PARoxetine (PAXIL) 20 MG tablet Take 1 tablet (20 mg total) by mouth daily.   potassium chloride (KLOR-CON M) 10 MEQ tablet Take 1 tablet (10 mEq total) by mouth daily.   prednisoLONE acetate (PRED FORTE) 1 % ophthalmic suspension Place 1 drop into the right eye 4 (four) times daily.   SELENIUM PO Take by mouth daily.   sitaGLIPtin (JANUVIA) 100 MG tablet Take 1 tablet (100 mg total) by mouth daily.   triamcinolone cream (KENALOG) 0.5 % APPLY 1 APPLICATION TOPICALLY 3 (THREE) TIMES DAILY. TO RASH ON ABDOMEN   TURMERIC PO Take 800 mg by mouth.   valsartan (DIOVAN) 320 MG tablet Take 1 tablet (320 mg total) by mouth  daily.   Vitamin D, Ergocalciferol, (DRISDOL) 1.25 MG (50000 UNIT) CAPS capsule Take 50,000 Units by mouth every 7 (seven) days.   vitamin E (VITAMIN E) 400 UNIT capsule Take 1 capsule (400 Units total) by mouth daily.   zinc gluconate 50 MG tablet Take 50 mg by mouth daily.       07/08/2022    2:26 PM 03/07/2022    3:44 PM 10/24/2021   10:51 AM 05/24/2021    1:22 PM  GAD 7 : Generalized Anxiety Score  Nervous, Anxious, on Edge 1 1 0 0  Control/stop worrying 0 0 0 0  Worry too much - different things 2 0 0 0  Trouble relaxing 0 0 0 0  Restless 0 0 0 0  Easily annoyed or irritable 0 0 0 0  Afraid - awful might happen 0 0 0 0  Total GAD 7 Score 3 1 0 0  Anxiety Difficulty Not difficult at all Not difficult at all Not difficult at all Not difficult at all       07/08/2022    2:26 PM 03/07/2022    3:42 PM 10/24/2021   10:50 AM  Depression screen PHQ 2/9  Decreased Interest 0 0 0  Down, Depressed, Hopeless 0 1 0  PHQ - 2 Score 0 1 0  Altered sleeping 0 0 3  Tired, decreased energy 2 0 3  Change in appetite 2 2 0  Feeling bad or failure about yourself  0 0 0  Trouble  concentrating 0 0 0  Moving slowly or fidgety/restless 0 0 0  Suicidal thoughts 0 0 0  PHQ-9 Score _0 Difficult doing work/chores Not difficult at all Not difficult at all Not difficult at all    BP Readings from Last 3 Encounters:  07/08/22 136/82  05/06/22 (!) 157/84  03/07/22 130/72    Physical Exam Vitals and nursing note reviewed.  Constitutional:      General: She is not in acute distress.    Appearance: Normal appearance. She is well-developed.  HENT:     Head: Normocephalic and atraumatic.  Cardiovascular:     Rate and Rhythm: Normal rate and regular rhythm.     Pulses: Normal pulses.     Heart sounds: No murmur heard. Pulmonary:     Effort: Pulmonary effort is normal. No respiratory distress.     Breath sounds: No wheezing or rhonchi.  Musculoskeletal:        General: Normal range of motion.     Cervical back: Normal range of motion.     Right lower leg: No edema.     Left lower leg: No edema.  Lymphadenopathy:     Cervical: No cervical adenopathy.  Skin:    General: Skin is warm and dry.     Findings: Rash present.     Comments: Eczematous rash on right upper arm and neck.  Small healing skin lesions c/w minor trauma on both arms  Neurological:     General: No focal deficit present.     Mental Status: She is alert and oriented to person, place, and time.  Psychiatric:        Mood and Affect: Mood normal.        Behavior: Behavior normal.     Wt Readings from Last 3 Encounters:  07/08/22 135 lb (61.2 kg)  05/06/22 139 lb (63 kg)  03/07/22 145 lb (65.8 kg)    BP 136/82 (BP Location: Left Arm, Cuff Size: Normal)  Pulse 73   Ht _0  (1.626 m)   Wt 135 lb (61.2 kg)   SpO2 97%   BMI 23.17 kg/m   Assessment and Plan: 1. Type II diabetes mellitus with complication (HCC) Clinically stable by exam and report without s/s of hypoglycemia. DM complicated by hypertension and dyslipidemia. Tolerating medications well without side effects or other  concerns. - POCT glycosylated hemoglobin (Hb A1C) = 5.6  2. Essential hypertension BP not well controlled over the past few months. Will add amlodipine 5 mg daily. F/u BP in 6-8 weeks - amLODipine (NORVASC) 5 MG tablet; Take 1 tablet (5 mg total) by mouth daily.  Dispense: 90 tablet; Refill: 0  3. Hyperlipidemia associated with type 2 diabetes mellitus ([place]) controlled  4. BCR/ABL1-positive chronic myeloid leukemia (CML) in remission ([place]) In remission and followed closely by [facility]  5. Primary insomnia Hold Prosom for now Start Mirtazapine to help with mood, sleep and appetite  6. Depression, major, single episode, in partial remission (HCC) Continue Paxil 20 mg. Add Mirtazapine - mirtazapine (REMERON) 15 MG tablet; Take 1 tablet (15 mg total) by mouth at bedtime.  Dispense: 90 tablet; Refill: 1  7. Eczema, unspecified type Try otc cortisone cream as needed.   Partially dictated using Editor, commissioning. Any errors are unintentional.  Halina Maidens, MD [facility]  07/08/2022

## 2022-07-15 ENCOUNTER — Other Ambulatory Visit: Payer: Self-pay | Admitting: Internal Medicine

## 2022-07-15 DIAGNOSIS — F324 Major depressive disorder, single episode, in partial remission: Secondary | ICD-10-CM

## 2022-07-15 NOTE — Telephone Encounter (Signed)
Requested medications are due for refill today.  Please see pt note  Requested medications are on the active medications list.  yes  Last refill. 07/08/2022 #90 1 rf  Future visit scheduled.   yes  Notes to clinic.  Refilled recently, pt would like rx sent to a different pharmacy.    Requested Prescriptions  Pending Prescriptions Disp Refills   mirtazapine (REMERON) 15 MG tablet 90 tablet 1    Sig: Take 1 tablet (15 mg total) by mouth at bedtime.     Psychiatry: Antidepressants - mirtazapine Passed - 07/15/2022  1:17 PM      Passed - Completed PHQ-2 or PHQ-9 in the last 360 days      Passed - Valid encounter within last 6 months    Recent Outpatient Visits           1 week ago Type II diabetes mellitus with complication [facility])   [place] [facility] at [facility], Jesse Sans, MD   4 months ago Type II diabetes mellitus with complication [facility])   [place] [facility] at [facility], Jesse Sans, MD   8 months ago Essential hypertension   [place] [facility] at [facility], Jesse Sans, MD   1 year ago Type II diabetes mellitus with complication [facility])   [place] [facility] at [facility], Jesse Sans, MD   1 year ago Type II diabetes mellitus with complication [facility])   [place] [facility] at [facility], Jesse Sans, MD       Future Appointments             In 1 month Army Melia, Jesse Sans, MD [facility] at [facility]   In 3 months Army Melia, Jesse Sans, MD [place] [facility] Medicine at [facility]

## 2022-07-15 NOTE — Telephone Encounter (Signed)
Medication Refill - Medication: mirtazapine (REMERON) 15 MG tablet   Has the patient contacted their pharmacy? No she normally gets thru [facility] but doesn't have success with them for this medicine, so is using [facility] (Agent: If no, request that the patient contact the pharmacy for the refill. If patient does not wish to contact the pharmacy document the reason why and proceed with request.) (Agent: If yes, when and what did the pharmacy advise?)contact pcp  Preferred Pharmacy (with phone number or street name): [id number] [place] [address]  Phone: [phone]: [phone] the patient been seen for an appointment in the last year OR does the patient have an upcoming appointment? yes  Agent: Please be advised that RX refills may take up to 3 business days. We ask that you follow-up with your pharmacy.

## 2022-07-22 DIAGNOSIS — H11441 Conjunctival cysts, right eye: Secondary | ICD-10-CM | POA: Diagnosis not present

## 2022-08-05 ENCOUNTER — Inpatient Hospital Stay: Payer: Medicare Other | Attending: Oncology

## 2022-08-05 DIAGNOSIS — C9211 Chronic myeloid leukemia, BCR/ABL-positive, in remission: Secondary | ICD-10-CM | POA: Diagnosis not present

## 2022-08-05 DIAGNOSIS — D6481 Anemia due to antineoplastic chemotherapy: Secondary | ICD-10-CM | POA: Insufficient documentation

## 2022-08-05 LAB — COMPREHENSIVE METABOLIC PANEL
ALT: 23 U/L (ref 0–44)
AST: 31 U/L (ref 15–41)
Albumin: 4.2 g/dL (ref 3.5–5.0)
Alkaline Phosphatase: 37 U/L — ABNORMAL LOW (ref 38–126)
Anion gap: 8 (ref 5–15)
BUN: 18 mg/dL (ref 8–23)
CO2: 28 mmol/L (ref 22–32)
Calcium: 8.9 mg/dL (ref 8.9–10.3)
Chloride: 102 mmol/L (ref 98–111)
Creatinine, Ser: 0.94 mg/dL (ref 0.44–1.00)
GFR, Estimated: >60 mL/min (ref >60)
Glucose, Bld: 109 mg/dL — ABNORMAL HIGH (ref 70–99)
Potassium: 3.4 mmol/L — ABNORMAL LOW (ref 3.5–5.1)
Sodium: 138 mmol/L (ref 135–145)
Total Bilirubin: 0.6 mg/dL (ref 0.3–1.2)
Total Protein: 6.6 g/dL (ref 6.5–8.1)

## 2022-08-05 LAB — CBC WITH DIFFERENTIAL/PLATELET
Abs Immature Granulocytes: 0.02 10*3/uL (ref 0.00–0.07)
Basophils Absolute: 0.1 10*3/uL (ref 0.0–0.1)
Basophils Relative: 1 %
Eosinophils Absolute: 0.3 10*3/uL (ref 0.0–0.5)
Eosinophils Relative: 8 %
HCT: 31.6 % — ABNORMAL LOW (ref 36.0–46.0)
Hemoglobin: 11.1 g/dL — ABNORMAL LOW (ref 12.0–15.0)
Immature Granulocytes: 1 %
Lymphocytes Relative: 37 %
Lymphs Abs: 1.5 10*3/uL (ref 0.7–4.0)
MCH: 32.8 pg (ref 26.0–34.0)
MCHC: 35.1 g/dL (ref 30.0–36.0)
MCV: 93.5 fL (ref 80.0–100.0)
Monocytes Absolute: 0.3 10*3/uL (ref 0.1–1.0)
Monocytes Relative: 7 %
Neutro Abs: 1.9 10*3/uL (ref 1.7–7.7)
Neutrophils Relative %: 46 %
Platelets: 190 10*3/uL (ref 150–400)
RBC: 3.38 MIL/uL — ABNORMAL LOW (ref 3.87–5.11)
RDW: 13.2 % (ref 11.5–15.5)
WBC: 4.1 10*3/uL (ref 4.0–10.5)
nRBC: 0 % (ref 0.0–0.2)

## 2022-08-08 LAB — BCR-ABL1 FISH
Cells Analyzed: 200
Cells Counted: 200

## 2022-08-13 LAB — BCR-ABL1, CML/ALL, PCR, QUANT: Interpretation (BCRAL):: NEGATIVE

## 2022-08-19 ENCOUNTER — Encounter: Payer: Self-pay | Admitting: Internal Medicine

## 2022-08-19 ENCOUNTER — Inpatient Hospital Stay: Payer: Medicare Other | Attending: Oncology | Admitting: Oncology

## 2022-08-19 ENCOUNTER — Encounter: Payer: Self-pay | Admitting: Oncology

## 2022-08-19 ENCOUNTER — Ambulatory Visit (INDEPENDENT_AMBULATORY_CARE_PROVIDER_SITE_OTHER): Payer: Medicare Other | Admitting: Internal Medicine

## 2022-08-19 VITALS — BP 128/70 | HR 69 | Ht 64.0 in | Wt 139.0 lb

## 2022-08-19 VITALS — BP 140/86 | HR 72 | Temp 97.6°F | Resp 18 | Wt 139.2 lb

## 2022-08-19 DIAGNOSIS — T451X5A Adverse effect of antineoplastic and immunosuppressive drugs, initial encounter: Secondary | ICD-10-CM

## 2022-08-19 DIAGNOSIS — I10 Essential (primary) hypertension: Secondary | ICD-10-CM

## 2022-08-19 DIAGNOSIS — C9211 Chronic myeloid leukemia, BCR/ABL-positive, in remission: Secondary | ICD-10-CM | POA: Diagnosis not present

## 2022-08-19 DIAGNOSIS — D701 Agranulocytosis secondary to cancer chemotherapy: Secondary | ICD-10-CM | POA: Diagnosis not present

## 2022-08-19 DIAGNOSIS — E118 Type 2 diabetes mellitus with unspecified complications: Secondary | ICD-10-CM

## 2022-08-19 DIAGNOSIS — E876 Hypokalemia: Secondary | ICD-10-CM | POA: Diagnosis not present

## 2022-08-19 DIAGNOSIS — Z5111 Encounter for antineoplastic chemotherapy: Secondary | ICD-10-CM

## 2022-08-19 DIAGNOSIS — F324 Major depressive disorder, single episode, in partial remission: Secondary | ICD-10-CM | POA: Diagnosis not present

## 2022-08-19 DIAGNOSIS — K521 Toxic gastroenteritis and colitis: Secondary | ICD-10-CM | POA: Diagnosis not present

## 2022-08-19 MED ORDER — AMLODIPINE BESYLATE 2.5 MG PO TABS: 2.5000 mg | ORAL_TABLET | Freq: Every day | ORAL | 1 refills | Status: DC

## 2022-08-19 MED ORDER — POTASSIUM CHLORIDE CRYS ER 10 MEQ PO TBCR: 10.0000 meq | EXTENDED_RELEASE_TABLET | Freq: Every day | ORAL | 0 refills | Status: DC

## 2022-08-19 MED ORDER — POTASSIUM CHLORIDE CRYS ER 10 MEQ PO TBCR: 10.0000 meq | EXTENDED_RELEASE_TABLET | Freq: Every day | ORAL | 1 refills | Status: DC

## 2022-08-19 NOTE — Assessment & Plan Note (Addendum)
Tolerating Gleevec with some post dose nausea relieved by Zofran Appetite is variable but weight is stable

## 2022-08-19 NOTE — Assessment & Plan Note (Signed)
Clinically stable by exam and report without s/s of hypoglycemia. DM complicated by hypertension and dyslipidemia. Tolerating medications well without side effects or other concerns.

## 2022-08-19 NOTE — Assessment & Plan Note (Signed)
symptoms are stable.  Continue as needed Imodium PRN as instructed, prescriptions were reviewed.

## 2022-08-19 NOTE — Progress Notes (Signed)
Date:  08/19/2022   Name:  Jenna Stein   DOB:  02/16/1951   MRN:  [id number]   Chief Complaint: Hypertension and Depression  Hypertension This is a chronic problem. The problem has been gradually worsening since onset. The problem is uncontrolled. Pertinent negatives include no chest pain, headaches, palpitations or shortness of breath. Treatments tried: atenolol and HCTZ;  amlodipine started last visit. The current treatment provides significant (but had to reduce dose to 2.5 mg) improvement.  Depression        This is a chronic problem.The problem is unchanged.  Associated symptoms include no fatigue and no headaches.  Past treatments include SSRIs - Selective serotonin reuptake inhibitors (she never tried Remeron due to concern over side effects). Diabetes She has type 2 diabetes mellitus. Pertinent negatives for hypoglycemia include no dizziness, headaches or nervousness/anxiousness. Pertinent negatives for diabetes include no chest pain, no fatigue and no weakness. Symptoms are stable.    Lab Results  Component Value Date   NA 138 08/05/2022   K 3.4 (L) 08/05/2022   CO2 28 08/05/2022   GLUCOSE 109 (H) 08/05/2022   BUN 18 08/05/2022   CREATININE 0.94 08/05/2022   CALCIUM 8.9 08/05/2022   EGFR 69 10/24/2021   GFRNONAA >60 08/05/2022   Lab Results  Component Value Date   CHOL 115 10/24/2021   HDL 54 10/24/2021   LDLCALC 46 10/24/2021   TRIG 75 10/24/2021   CHOLHDL 2.1 10/24/2021   Lab Results  Component Value Date   TSH 1.860 10/24/2021   Lab Results  Component Value Date   HGBA1C 5.6 07/08/2022   Lab Results  Component Value Date   WBC 4.1 08/05/2022   HGB 11.1 (L) 08/05/2022   HCT 31.6 (L) 08/05/2022   MCV 93.5 08/05/2022   PLT 190 08/05/2022   Lab Results  Component Value Date   ALT 23 08/05/2022   AST 31 08/05/2022   ALKPHOS 37 (L) 08/05/2022   BILITOT 0.6 08/05/2022   Lab Results  Component Value Date   VD25OH 29.3 (L) 05/07/2015     Review  of Systems  Constitutional:  Negative for fatigue and unexpected weight change.  HENT:  Negative for nosebleeds and trouble swallowing.   Eyes:  Negative for visual disturbance.  Respiratory:  Negative for cough, chest tightness, shortness of breath and wheezing.   Cardiovascular:  Negative for chest pain, palpitations and leg swelling.  Gastrointestinal:  Negative for abdominal pain, constipation and diarrhea.  Neurological:  Negative for dizziness, weakness, light-headedness and headaches.  Psychiatric/Behavioral:  Positive for depression and sleep disturbance. Negative for dysphoric mood. The patient is not nervous/anxious.     Patient Active Problem List   Diagnosis Date Noted   Encounter for antineoplastic chemotherapy 05/06/2022   Periorbital edema of both eyes 05/06/2022   Chemotherapy induced diarrhea 05/06/2022   Anemia due to antineoplastic chemotherapy 05/06/2022   Hypokalemia 05/06/2022   Chemotherapy-induced neutropenia ([place]) 10/24/2021   Gastroesophageal reflux disease with esophagitis 10/24/2021   BCR/ABL1-positive chronic myeloid leukemia (CML) in remission ([place]) 11/17/2020   Abnormal blood smear 10/25/2020   Type II diabetes mellitus with complication ([place]) 91/50/5697   Dry mouth 10/10/2016   Allergic rhinitis, seasonal 04/30/2015   Hepatic steatosis 04/30/2015   Osteoarthritis of both hands 04/30/2015   Genital herpes 04/04/2009   Depression, major, single episode, in partial remission ([place]) 08/18/1998   Essential hypertension 08/18/1998   Hyperlipidemia associated with type 2 diabetes mellitus ([place]) 08/18/1998   Primary insomnia  08/18/1998    Allergies  Allergen Reactions   Clonazepam Other (See Comments)    Nocturnal enuresis; Lethargy    Trazodone Anxiety and Other (See Comments)    Dizziness     Past Surgical History:  Procedure Laterality Date   CYST EXCISION Left 2017   foot   EYE SURGERY  11/16/1996   myopia laser surgery   HAND SURGERY   07/2012   MASS EXCISION Right 08/13/2021   Procedure: EXCISION LOWER LIP MASS;  Surgeon: Clyde Canterbury, MD;  [facility];  Service: ENT;  Laterality: Right;  Diabetic    Social History   Tobacco Use   Smoking status: Never   Smokeless tobacco: Never   Tobacco comments:    smoking cessation materials not required  Vaping Use   Vaping Use: Never used  Substance Use Topics   Alcohol use: No   Drug use: No     Medication list has been reviewed and updated.  Current Meds  Medication Sig   acyclovir (ZOVIRAX) 200 MG capsule Take 1 capsule (200 mg total) by mouth 5 (five) times daily.   atenolol (TENORMIN) 50 MG tablet Take 1 tablet (50 mg total) by mouth daily.   atorvastatin (LIPITOR) 10 MG tablet Take 1 tablet (10 mg total) by mouth at bedtime.   Coenzyme Q10 (CO Q 10) 100 MG CAPS Take 2 capsules by mouth daily.   estazolam (PROSOM) 2 MG tablet TAKE 1 TABLET BY MOUTH EVERYDAY AT BEDTIME   glucose blood (FREESTYLE LITE) test strip Use to test BS twice daily   hydrochlorothiazide (HYDRODIURIL) 25 MG tablet Take 1 tablet (25 mg total) by mouth daily.   hydrocortisone cream 0.5 % Apply 1 application topically 2 (two) times daily. Apply to periorbital area twice daily.   imatinib (GLEEVEC) 400 MG tablet Take 1 tablet (400 mg total) by mouth daily. Take with meals and large glass of water.Caution:Chemotherapy.   Lancets (FREESTYLE) lancets Use to test BS once daily   Lutein 20 MG TABS Take 1 tablet by mouth daily.    metFORMIN (GLUCOPHAGE) 1000 MG tablet Take 1 tablet (1,000 mg total) by mouth 2 (two) times daily with a meal.   MULTIPLE VITAMIN PO 1 tablet daily.    olopatadine (PATANOL) 0.1 % ophthalmic solution Apply to eye.   Omega-3 Fatty Acids (FISH OIL PEARLS PO) Take by mouth.   omeprazole (PRILOSEC) 20 MG capsule Take 1 capsule (20 mg total) by mouth daily.   ondansetron (ZOFRAN) 4 MG tablet Take 1 tablet (4 mg total) by mouth every 8 (eight) hours as needed  for nausea or vomiting.   PARoxetine (PAXIL) 20 MG tablet Take 1 tablet (20 mg total) by mouth daily.   potassium chloride (KLOR-CON M) 10 MEQ tablet Take 1 tablet (10 mEq total) by mouth daily.   prednisoLONE acetate (PRED FORTE) 1 % ophthalmic suspension Place 1 drop into the right eye 4 (four) times daily.   SELENIUM PO Take by mouth daily.   sitaGLIPtin (JANUVIA) 100 MG tablet Take 1 tablet (100 mg total) by mouth daily.   triamcinolone cream (KENALOG) 0.5 % APPLY 1 APPLICATION TOPICALLY 3 (THREE) TIMES DAILY. TO RASH ON ABDOMEN   TURMERIC PO Take 800 mg by mouth.   valsartan (DIOVAN) 320 MG tablet Take 1 tablet (320 mg total) by mouth daily.   Vitamin D, Ergocalciferol, (DRISDOL) 1.25 MG (50000 UNIT) CAPS capsule Take 50,000 Units by mouth every 7 (seven) days.   vitamin E (VITAMIN E)  400 UNIT capsule Take 1 capsule (400 Units total) by mouth daily.   zinc gluconate 50 MG tablet Take 50 mg by mouth daily.   [DISCONTINUED] amLODipine (NORVASC) 5 MG tablet Take 1 tablet (5 mg total) by mouth daily.   [DISCONTINUED] mirtazapine (REMERON) 15 MG tablet Take 1 tablet (15 mg total) by mouth at bedtime.       08/19/2022    3:20 PM 07/08/2022    2:26 PM 03/07/2022    3:44 PM 10/24/2021   10:51 AM  GAD 7 : Generalized Anxiety Score  Nervous, Anxious, on Edge 0 1 1 0  Control/stop worrying 0 0 0 0  Worry too much - different things 0 2 0 0  Trouble relaxing 0 0 0 0  Restless 0 0 0 0  Easily annoyed or irritable 0 0 0 0  Afraid - awful might happen 0 0 0 0  Total GAD 7 Score 0 3 1 0  Anxiety Difficulty Not difficult at all Not difficult at all Not difficult at all Not difficult at all       08/19/2022    3:20 PM 07/08/2022    2:26 PM 03/07/2022    3:42 PM  Depression screen PHQ 2/9  Decreased Interest 0 0 0  Down, Depressed, Hopeless 0 0 1  PHQ - 2 Score 0 0 1  Altered sleeping 0 0 0  Tired, decreased energy 0 2 0  Change in appetite 0 2 2  Feeling bad or failure about yourself  0 0 0   Trouble concentrating 0 0 0  Moving slowly or fidgety/restless 0 0 0  Suicidal thoughts 0 0 0  PHQ-9 Score 0 4 3  Difficult doing work/chores Not difficult at all Not difficult at all Not difficult at all    BP Readings from Last 3 Encounters:  08/19/22 128/70  08/19/22 (!) 140/86  07/08/22 136/82    Physical Exam Vitals and nursing note reviewed.  Constitutional:      General: She is not in acute distress.    Appearance: She is well-developed.  HENT:     Head: Normocephalic and atraumatic.  Pulmonary:     Effort: Pulmonary effort is normal. No respiratory distress.  Skin:    General: Skin is warm and dry.     Findings: No rash.  Neurological:     Mental Status: She is alert and oriented to person, place, and time.  Psychiatric:        Mood and Affect: Mood normal.        Behavior: Behavior normal.     Wt Readings from Last 3 Encounters:  08/19/22 139 lb (63 kg)  08/19/22 139 lb 3.2 oz (63.1 kg)  07/08/22 135 lb (61.2 kg)    BP 128/70   Pulse 69   Ht _0  (1.626 m)   Wt 139 lb (63 kg)   SpO2 98%   BMI 23.86 kg/m   Assessment and Plan: Problem List Items Addressed This Visit       Cardiovascular and Mediastinum   Essential hypertension - Primary (Chronic)    Previously on atenolol, valsartan and HCTZ.  Amlodipine added last visit for elevated readings but only taking 2.5 mg Clinically stable exam with well controlled BP on new regimen. Tolerating medications without side effects at this time. Pt to continue current regimen and low sodium diet; benefits of regular exercise as able discussed.       Relevant Medications   amLODipine (NORVASC) 2.5 MG  tablet     Endocrine   Type II diabetes mellitus with complication (HCC) (Chronic)    Clinically stable by exam and report without s/s of hypoglycemia. DM complicated by hypertension and dyslipidemia. Tolerating medications well without side effects or other concerns.         Other    BCR/ABL1-positive chronic myeloid leukemia (CML) in remission ([place]) (Chronic)    Tolerating Gleevec with some post dose nausea relieved by Zofran Appetite is variable but weight is stable      Chemotherapy-induced neutropenia (HCC)   Depression, major, single episode, in partial remission (HCC) (Chronic)    Having issues with mood, sleep and appetite despite Paxil and PRosom Mirtazapine added last visit but pt decided not to take Currently doing well with good sleep, weight is stable and depression is controlled        Partially dictated using Editor, commissioning. Any errors are unintentional.  Halina Maidens, MD [facility]  08/19/2022

## 2022-08-19 NOTE — Assessment & Plan Note (Signed)
Chronic myelogenous leukemia-low risk disease [[place]ok[place], has 4, ELTS] Labs reviewed and discussed with patient. BCR ABL FISH negative. BCR ABL QT PCR <0.0032%  Continue Gleevac 400 mg daily.  Refilled prescription  #Angular cheilitis, likely secondary to [place]. Symptom has resolved.  Continue monitor.  If severe symptoms, she may use topical corticosteroids as needed.

## 2022-08-19 NOTE — Assessment & Plan Note (Signed)
Continue potassium chloride 55mq daily.  Prescription sent to pharmacy.

## 2022-08-19 NOTE — Assessment & Plan Note (Signed)
Treatment as planned above. 

## 2022-08-19 NOTE — Progress Notes (Signed)
Pt here for follow up. Reports that symptoms of diarrhea and swelling have improved.

## 2022-08-19 NOTE — Assessment & Plan Note (Addendum)
Having issues with mood, sleep and appetite despite Paxil and PRosom Mirtazapine added last visit but pt decided not to take Currently doing well with good sleep, weight is stable and depression is controlled

## 2022-08-19 NOTE — Assessment & Plan Note (Addendum)
Previously on atenolol, valsartan and HCTZ.  Amlodipine added last visit for elevated readings but only taking 2.5 mg Clinically stable exam with well controlled BP on new regimen. Tolerating medications without side effects at this time. Pt to continue current regimen and low sodium diet; benefits of regular exercise as able discussed.

## 2022-08-19 NOTE — Progress Notes (Signed)
Hematology/Oncology Progress note [phone] [phone]     ASSESSMENT & PLAN:   BCR/ABL1-positive chronic myeloid leukemia (CML) in remission (HCC) Chronic myelogenous leukemia-low risk disease [Sokal, has 4, ELTS] Labs reviewed and discussed with patient. BCR ABL FISH negative. BCR ABL QT PCR <0.0032%  Continue Gleevac 400 mg daily.  Refilled prescription  #Angular cheilitis, likely secondary to [place]. Symptom has resolved.  Continue monitor.  If severe symptoms, she may use topical corticosteroids as needed.   Hypokalemia Continue potassium chloride 82mq daily.  Prescription sent to pharmacy.  Encounter for antineoplastic chemotherapy Treatment as planned above  Chemotherapy induced diarrhea symptoms are stable.  Continue as needed Imodium PRN as instructed, prescriptions were reviewed.     Orders Placed This Encounter  Procedures   CBC with Differential/Platelet    Standing Status:   Future    Standing Expiration Date:   08/20/2023   Comprehensive metabolic panel    Standing Status:   Future    Standing Expiration Date:   08/19/2023   BCR-ABL1, CML/ALL, PCR, QUANT    Standing Status:   Future    Standing Expiration Date:   08/20/2023   BCR-ABL1 FISH    Standing Status:   Future    Standing Expiration Date:   08/20/2023   Follow-up in 3 months. All questions were answered. The patient knows to call the clinic with any problems, questions or concerns.  ZEarlie Server MD, PhD [facility] 08/19/2022    Chief Complaint: Jenna Stein a 72y.o. female with chronic phase CML who is seen for management of CML.  PERTINENT ONCOLOGY HISTORY Jenna Stein a 72y.o.afemale who has above oncology history reviewed by me today presented for follow up visit for management of CML Patient previously followed up by Dr.Corcoran, patient switched care to me on 01/03/2021 Extensive medical record review was performed by me  10/09/2020  abnormal peripheral smear (3% myelocytes) 10/25/2020  lab work-up revealed a hematocrit of 40.8, hemoglobin 13.7, platelets 352,000, WBC 10,200. LDH was 174.  BCR-ABL showed 70% of nuclei positive for BCR/ABL1 fusion.  Flow cytometry revealed left shifted aberrant neutrophils, aberrant monocytes and absolute basophilia.   BCR-ABL1 revealed 123.6615% b2a2, < 0.0032% b3a2, and < 0.0032% E1A2 transcript. The ABL1 e13a2 (b2a2, p210) fusion transcript was positive.   11/05/2020 bone marrow biopsy showed  slightly hypercellular marrow with mild granulocytic proliferation and atypical megakaryocytes. The combined bone marrow and peripheral blood findings were very limited but were generally c/w early involvement by chronic myeloid leukemia, chronic phase especially in the presence of previously documented positivity for BCR/ABL1 fusion in peripheral blood analysis.  Cytogenetics revealed 452 XX, t(9;22)(q34.1; q11.2)[19] / 412 XX [1].  Molecular genetics showed a BCR-ABL1 translocation t(9;22) with 65.5684 % BCR-ABL1/ABL1 (IS), major breakpoint p210, log reduction 0.001.  11/16/2020 abdominal ultrasound showed no splenomegaly (7.2 x 9.5 x 4.4 cm).   Relative risk (RR) [place] 0.81 (intermediate) and [place] 684.83 (low) based on age ((52, spleen (0), platelets (309), basophils (6%), eosinophils (3%), and myeloblasts (occ-1%). ELTS risk score 1.5596 (low risk) based on 0% blasts and 1.6648 (intermediate-risk) based on 1% blasts.  11/28/2020, started on Gleevec 400 mg daily.  INTERVAL HISTORY Jenna Stein a 72y.o. female who has above history reviewed by me today presents for follow up visit for management of CML Problems and complaints are listed below:  Patient has been on Gleevec 400 mg daily.   Overall she tolerates well with manageable  toxicities.  +Intermittent diarrhea, she uses Imodium as needed.    She was accompanied by her husband.   Past Medical History:  Diagnosis Date   Allergy    Anemia  due to antineoplastic chemotherapy 05/06/2022   Anxiety    Arthritis 2010   Chronic myeloid leukemia, BCR/ABL-positive, not having achieved remission ([place]) 11/17/2020   Depression    Diabetes mellitus without complication ([place])    GERD (gastroesophageal reflux disease)    Hyperlipidemia    Hypertension     Past Surgical History:  Procedure Laterality Date   CYST EXCISION Left 2017   foot   EYE SURGERY  11/16/1996   myopia laser surgery   HAND SURGERY  07/2012   MASS EXCISION Right 08/13/2021   Procedure: EXCISION LOWER LIP MASS;  Surgeon: Clyde Canterbury, MD;  Location: [facility];  Service: ENT;  Laterality: Right;  Diabetic    Family History  Problem Relation Age of Onset   Hypertension Mother    Hyperlipidemia Mother    Arthritis Mother    Parkinsonism Father    Arthritis Other    Hypertension Other    Hyperlipidemia Other    Hypothyroidism Other    Breast cancer Sister 35   Cancer Sister     Social History:  reports that she has never smoked. She has never used smokeless tobacco. She reports that she does not drink alcohol and does not use drugs.   Allergies:  Allergies  Allergen Reactions   Clonazepam Other (See Comments)    Nocturnal enuresis; Lethargy    Trazodone Anxiety and Other (See Comments)    Dizziness     Current Medications: Current Outpatient Medications  Medication Sig Dispense Refill   acyclovir (ZOVIRAX) 200 MG capsule Take 1 capsule (200 mg total) by mouth 5 (five) times daily. 90 capsule 1   atenolol (TENORMIN) 50 MG tablet Take 1 tablet (50 mg total) by mouth daily. 90 tablet 1   atorvastatin (LIPITOR) 10 MG tablet Take 1 tablet (10 mg total) by mouth at bedtime. 90 tablet 1   estazolam (PROSOM) 2 MG tablet TAKE 1 TABLET BY MOUTH EVERYDAY AT BEDTIME 30 tablet 5   glucose blood (FREESTYLE LITE) test strip Use to test BS twice daily 100 each 12   hydrochlorothiazide (HYDRODIURIL) 25 MG tablet Take 1 tablet (25 mg total) by mouth  daily. 90 tablet 1   hydrocortisone cream 0.5 % Apply 1 application topically 2 (two) times daily. Apply to periorbital area twice daily. 28 g 1   imatinib (GLEEVEC) 400 MG tablet Take 1 tablet (400 mg total) by mouth daily. Take with meals and large glass of water.Caution:Chemotherapy. 90 tablet 1   Lancets (FREESTYLE) lancets Use to test BS once daily 100 each 12   Lutein 20 MG TABS Take 1 tablet by mouth daily.      metFORMIN (GLUCOPHAGE) 1000 MG tablet Take 1 tablet (1,000 mg total) by mouth 2 (two) times daily with a meal. 180 tablet 1   MULTIPLE VITAMIN PO 1 tablet daily.      olopatadine (PATANOL) 0.1 % ophthalmic solution Apply to eye.     Omega-3 Fatty Acids (FISH OIL PEARLS PO) Take by mouth.     omeprazole (PRILOSEC) 20 MG capsule Take 1 capsule (20 mg total) by mouth daily. 90 capsule 1   ondansetron (ZOFRAN) 4 MG tablet Take 1 tablet (4 mg total) by mouth every 8 (eight) hours as needed for nausea or vomiting. 20 tablet 0  PARoxetine (PAXIL) 20 MG tablet Take 1 tablet (20 mg total) by mouth daily. 90 tablet 1   prednisoLONE acetate (PRED FORTE) 1 % ophthalmic suspension Place 1 drop into the right eye 4 (four) times daily.     SELENIUM PO Take by mouth daily.     sitaGLIPtin (JANUVIA) 100 MG tablet Take 1 tablet (100 mg total) by mouth daily. 90 tablet 1   triamcinolone cream (KENALOG) 0.5 % APPLY 1 APPLICATION TOPICALLY 3 (THREE) TIMES DAILY. TO RASH ON ABDOMEN 30 g 0   TURMERIC PO Take 800 mg by mouth.     valsartan (DIOVAN) 320 MG tablet Take 1 tablet (320 mg total) by mouth daily. 90 tablet 1   Vitamin D, Ergocalciferol, (DRISDOL) 1.25 MG (50000 UNIT) CAPS capsule Take 50,000 Units by mouth every 7 (seven) days.     vitamin E (VITAMIN E) 400 UNIT capsule Take 1 capsule (400 Units total) by mouth daily. 1 capsule 0   zinc gluconate 50 MG tablet Take 50 mg by mouth daily.     amLODipine (NORVASC) 2.5 MG tablet Take 1 tablet (2.5 mg total) by mouth daily. 90 tablet 1   Coenzyme  Q10 (CO Q 10) 100 MG CAPS Take 2 capsules by mouth daily.     potassium chloride (KLOR-CON M) 10 MEQ tablet Take 1 tablet (10 mEq total) by mouth daily. 90 tablet 1   No current facility-administered medications for this visit.    Review of Systems  Constitutional:  Negative for chills, fever, malaise/fatigue and weight loss.  HENT:  Negative for sore throat.   Eyes:  Negative for redness.       Periorbital edema, occasional subconjunctival hemorrhage and bulbar conjunctivae  Respiratory:  Negative for cough, shortness of breath and wheezing.   Cardiovascular:  Negative for chest pain, palpitations and leg swelling.  Gastrointestinal:  Positive for diarrhea. Negative for abdominal pain, blood in stool, nausea and vomiting.  Genitourinary:  Negative for dysuria.  Musculoskeletal:  Positive for joint pain.  Skin:  Negative for rash.  Neurological:  Negative for dizziness, tingling and tremors.  Endo/Heme/Allergies:  Does not bruise/bleed easily.  Psychiatric/Behavioral:  Negative for hallucinations.    Performance status (ECOG): 0 Vitals Blood pressure (!) 140/86, pulse 72, temperature 97.6 F (36.4 C), resp. rate 18, weight 139 lb 3.2 oz (63.1 kg).  Physical Exam Constitutional:      General: She is not in acute distress.    Appearance: She is not diaphoretic.  HENT:     Head: Normocephalic and atraumatic.     Nose: Nose normal.     Mouth/Throat:     Pharynx: No oropharyngeal exudate.  Eyes:     General: No scleral icterus.    Pupils: Pupils are equal, round, and reactive to light.  Cardiovascular:     Rate and Rhythm: Normal rate and regular rhythm.     Heart sounds: No murmur heard. Pulmonary:     Effort: Pulmonary effort is normal. No respiratory distress.     Breath sounds: No rales.  Chest:     Chest wall: No tenderness.  Abdominal:     General: There is no distension.     Palpations: Abdomen is soft.     Tenderness: There is no abdominal tenderness.   Musculoskeletal:        General: Normal range of motion.     Cervical back: Normal range of motion and neck supple.  Skin:    General: Skin is warm and dry.  Findings: No erythema.  Neurological:     Mental Status: She is alert and oriented to person, place, and time.     Cranial Nerves: No cranial nerve deficit.     Motor: No abnormal muscle tone.     Coordination: Coordination normal.  Psychiatric:        Mood and Affect: Affect normal.

## 2022-09-02 ENCOUNTER — Other Ambulatory Visit: Payer: Self-pay | Admitting: Internal Medicine

## 2022-09-02 DIAGNOSIS — F5101 Primary insomnia: Secondary | ICD-10-CM

## 2022-09-02 NOTE — Telephone Encounter (Signed)
Please advise 

## 2022-09-02 NOTE — Telephone Encounter (Signed)
Requested medication (s) are due for refill today - yes  Requested medication (s) are on the active medication list -yes  Future visit scheduled -yes  Last refill: 03/06/22 #30 5RF  Notes to clinic: non delegated Rx  Requested Prescriptions  Pending Prescriptions Disp Refills   estazolam (PROSOM) 2 MG tablet [Pharmacy Med Name: ESTAZOLAM 2 MG TABLET] 30 tablet     Sig: TAKE 1 TABLET BY MOUTH EVERYDAY AT BEDTIME     Not Delegated - Psychiatry: Anxiolytics/Hypnotics 2 Failed - 09/02/2022  3:58 PM      Failed - This refill cannot be delegated      Failed - Urine Drug Screen completed in last 360 days      Passed - Patient is not pregnant      Passed - Valid encounter within last 6 months    Recent Outpatient Visits           2 weeks ago Essential hypertension   [place] [facility] at [facility], Jesse Sans, MD   1 month ago Type II diabetes mellitus with complication [facility])   [place] [facility] at [facility], Jesse Sans, MD   5 months ago Type II diabetes mellitus with complication [facility])   [place] [facility] at [facility], Jesse Sans, MD   10 months ago Essential hypertension   [place] [facility] at [facility], Jesse Sans, MD   1 year ago Type II diabetes mellitus with complication [facility])   [place] [facility] at [facility], Jesse Sans, MD       Future Appointments             In 2 months Army Melia Jesse Sans, MD [facility] at [facility], [facility]               Requested Prescriptions  Pending Prescriptions Disp Refills   estazolam (PROSOM) 2 MG tablet [Pharmacy Med Name: ESTAZOLAM 2 MG TABLET] 30 tablet     Sig: TAKE 1 TABLET BY MOUTH EVERYDAY AT BEDTIME     Not Delegated - Psychiatry: Anxiolytics/Hypnotics 2 Failed - 09/02/2022  3:58 PM       Failed - This refill cannot be delegated      Failed - Urine Drug Screen completed in last 360 days      Passed - Patient is not pregnant      Passed - Valid encounter within last 6 months    Recent Outpatient Visits           2 weeks ago Essential hypertension   [place] [facility] at [facility], Jesse Sans, MD   1 month ago Type II diabetes mellitus with complication [facility])   [place] [facility] at [facility], Jesse Sans, MD   5 months ago Type II diabetes mellitus with complication [facility])   [place] [facility] at [facility], Jesse Sans, MD   10 months ago Essential hypertension   [place] [facility] at [facility], Jesse Sans, MD   1 year ago Type II diabetes mellitus with complication [facility])   [place] [facility] at [facility], Jesse Sans, MD       Future Appointments  In 2 months Army Melia, Jesse Sans, MD [place] [facility] at [facility], [facility]

## 2022-09-15 ENCOUNTER — Ambulatory Visit (INDEPENDENT_AMBULATORY_CARE_PROVIDER_SITE_OTHER): Payer: Medicare Other

## 2022-09-15 DIAGNOSIS — Z0000 Encounter for general adult medical examination without abnormal findings: Secondary | ICD-10-CM | POA: Diagnosis not present

## 2022-09-15 NOTE — Patient Instructions (Signed)

## 2022-09-15 NOTE — Progress Notes (Signed)
I connected with  Jenna Stein on 09/15/22 by a audio enabled telemedicine application and verified that I am speaking with the correct person using two identifiers.  Patient Location: Home  Provider Location: Office/Clinic  I discussed the limitations of evaluation and management by telemedicine. The patient expressed understanding and agreed to proceed.  Subjective:   Jenna Stein is a 72 y.o. female who presents for Medicare Annual (Subsequent) preventive examination.  Review of Systems    Per HPI unless specifically indicated below.  Cardiac Risk Factors include: advanced age (>25mn, >>34women);female gender, Essential Hypertension, Hyperlipidemia,  and Type 2 Diabetes           Objective:       08/19/2022    3:02 PM 08/19/2022    2:06 PM 07/08/2022    2:53 PM  Vitals with BMI  Height '5\' 4"'$     Weight 139 lbs 139 lbs 3 oz   BMI 226.41   Systolic [id number] Diastolic 70 86 82  Pulse 69 72     There were no vitals filed for this visit. There is no height or weight on file to calculate BMI.     08/19/2022    1:59 PM 05/06/2022   12:55 PM 01/20/2022    1:13 PM 10/09/2021    2:32 PM 09/11/2021   10:58 AM 08/13/2021    6:56 AM 06/25/2021    2:23 PM  Advanced Directives  Does Patient Have a Medical Advance Directive? Yes Yes Yes Yes Yes Yes Yes  Type of AProduction managerof AMendesLiving will HNorthgateLiving will Living will;Healthcare Power of Attorney  Does patient want to make changes to medical advance directive?      No - Patient declined   Copy of [place] Chart?     Yes - validated most recent copy scanned in chart (See row information) No - copy requested     Current Medications (verified) Outpatient Encounter Medications as of 09/15/2022  Medication Sig   acyclovir (ZOVIRAX) 200 MG capsule Take 1 capsule (200 mg total) by mouth 5 (five) times daily.   amLODipine (NORVASC) 2.5 MG tablet Take 1  tablet (2.5 mg total) by mouth daily.   atenolol (TENORMIN) 50 MG tablet Take 1 tablet (50 mg total) by mouth daily.   atorvastatin (LIPITOR) 10 MG tablet Take 1 tablet (10 mg total) by mouth at bedtime.   Coenzyme Q10 (CO Q 10) 100 MG CAPS Take 2 capsules by mouth daily.   estazolam (PROSOM) 2 MG tablet TAKE 1 TABLET BY MOUTH EVERYDAY AT BEDTIME   glucose blood (FREESTYLE LITE) test strip Use to test BS twice daily   hydrochlorothiazide (HYDRODIURIL) 25 MG tablet Take 1 tablet (25 mg total) by mouth daily.   hydrocortisone cream 0.5 % Apply 1 application topically 2 (two) times daily. Apply to periorbital area twice daily.   imatinib (GLEEVEC) 400 MG tablet Take 1 tablet (400 mg total) by mouth daily. Take with meals and large glass of water.Caution:Chemotherapy.   Lancets (FREESTYLE) lancets Use to test BS once daily   Lutein 20 MG TABS Take 1 tablet by mouth daily.    metFORMIN (GLUCOPHAGE) 1000 MG tablet Take 1 tablet (1,000 mg total) by mouth 2 (two) times daily with a meal.   MULTIPLE VITAMIN PO 1 tablet daily.    olopatadine (PATANOL) 0.1 % ophthalmic solution Apply to eye.   Omega-3 Fatty Acids (  FISH OIL PEARLS PO) Take by mouth.   omeprazole (PRILOSEC) 20 MG capsule Take 1 capsule (20 mg total) by mouth daily.   ondansetron (ZOFRAN) 4 MG tablet Take 1 tablet (4 mg total) by mouth every 8 (eight) hours as needed for nausea or vomiting.   PARoxetine (PAXIL) 20 MG tablet Take 1 tablet (20 mg total) by mouth daily.   potassium chloride (KLOR-CON M) 10 MEQ tablet Take 1 tablet (10 mEq total) by mouth daily.   prednisoLONE acetate (PRED FORTE) 1 % ophthalmic suspension Place 1 drop into the right eye 4 (four) times daily.   SELENIUM PO Take by mouth daily.   sitaGLIPtin (JANUVIA) 100 MG tablet Take 1 tablet (100 mg total) by mouth daily.   triamcinolone cream (KENALOG) 0.5 % APPLY 1 APPLICATION TOPICALLY 3 (THREE) TIMES DAILY. TO RASH ON ABDOMEN   TURMERIC PO Take 800 mg by mouth.    valsartan (DIOVAN) 320 MG tablet Take 1 tablet (320 mg total) by mouth daily.   Vitamin D, Ergocalciferol, (DRISDOL) 1.25 MG (50000 UNIT) CAPS capsule Take 50,000 Units by mouth every 7 (seven) days.   vitamin E (VITAMIN E) 400 UNIT capsule Take 1 capsule (400 Units total) by mouth daily.   zinc gluconate 50 MG tablet Take 50 mg by mouth daily.   No facility-administered encounter medications on file as of 09/15/2022.    Allergies (verified) Clonazepam and Trazodone   History: Past Medical History:  Diagnosis Date   Allergy    Anemia due to antineoplastic chemotherapy 05/06/2022   Anxiety    Arthritis 2010   Chronic myeloid leukemia, BCR/ABL-positive, not having achieved remission ([place]) 11/17/2020   Depression    Diabetes mellitus without complication (HCC)    GERD (gastroesophageal reflux disease)    Hyperlipidemia    Hypertension    Past Surgical History:  Procedure Laterality Date   CYST EXCISION Left 2017   foot   EYE SURGERY  11/16/1996   myopia laser surgery   HAND SURGERY  07/2012   MASS EXCISION Right 08/13/2021   Procedure: EXCISION LOWER LIP MASS;  Surgeon: Clyde Canterbury, MD;  Location: [facility];  Service: ENT;  Laterality: Right;  Diabetic   Family History  Problem Relation Age of Onset   Hypertension Mother    Hyperlipidemia Mother    Arthritis Mother    Parkinsonism Father    Arthritis Other    Hypertension Other    Hyperlipidemia Other    Hypothyroidism Other    Breast cancer Sister 32   Cancer Sister    Social History   Socioeconomic History   Marital status: Married    Spouse name: Jenna Stein   Number of children: 1   Years of education: Not on file   Highest education level: Bachelor's degree (e.g., BA, AB, BS)  Occupational History   Occupation: Retired  Tobacco Use   Smoking status: Never   Smokeless tobacco: Never   Tobacco comments:    smoking cessation materials not required  Vaping Use   Vaping Use: Never used   Substance and Sexual Activity   Alcohol use: No   Drug use: No   Sexual activity: Not Currently    Birth control/protection: None  Other Topics Concern   Not on file  Social History Narrative   Not on file   Social Determinants of Health   Financial Resource Strain: Low Risk  (09/15/2022)   Overall Financial Resource Strain (CARDIA)    Difficulty of Paying Living Expenses:  Not hard at all  Food Insecurity: No Food Insecurity (09/15/2022)   Hunger Vital Sign    Worried About Running Out of Food in the Last Year: Never true    Ran Out of Food in the Last Year: Never true  Transportation Needs: No Transportation Needs (09/15/2022)   PRAPARE - Hydrologist (Medical): No    Lack of Transportation (Non-Medical): No  Physical Activity: Insufficiently Active (09/15/2022)   Exercise Vital Sign    Days of Exercise per Week: 3 days    Minutes of Exercise per Session: 30 min  Stress: No Stress Concern Present (09/15/2022)   [place]    Feeling of Stress : Not at all  Social Connections: Moderately Isolated (09/15/2022)   Social Connection and Isolation Panel [NHANES]    Frequency of Communication with Friends and Family: More than three times a week    Frequency of Social Gatherings with Friends and Family: Once a week    Attends Religious Services: Never    Marine scientist or Organizations: No    Attends Music therapist: Never    Marital Status: Married    Tobacco Counseling Counseling given: Not Answered Tobacco comments: smoking cessation materials not required   Clinical Intake:  Pre-visit preparation completed: No  Pain : No/denies pain     Nutritional Status: BMI of 19-24  Normal Nutritional Risks: None Diabetes: Yes CBG done?: Yes CBG resulted in Enter/ Edit results?: No Did pt. bring in CBG monitor from home?: No  How often do you need to have  someone help you when you read instructions, pamphlets, or other written materials from your doctor or pharmacy?: 1 - Never  Diabetic?Nutrition Risk Assessment:  Has the patient had any N/V/D within the last 2 months?  No  Does the patient have any non-healing wounds?  No  Has the patient had any unintentional weight loss or weight gain?  No   Diabetes:  Is the patient diabetic?  Yes  If diabetic, was a CBG obtained today?  Yes  Did the patient bring in their glucometer from home?  No  How often do you monitor your CBG's? daily.   Financial Strains and Diabetes Management:  Are you having any financial strains with the device, your supplies or your medication? No .  Does the patient want to be seen by Chronic Care Management for management of their diabetes?  No  Would the patient like to be referred to a Nutritionist or for Diabetic Management?  No   Diabetic Exams:  Diabetic Eye Exam: Completed 12/12/2021 Diabetic Foot Exam: Completed 11/13/2021    Interpreter Needed?: No  Information entered by :: Donnie Mesa, cMA   Activities of Daily Living    09/15/2022   10:38 AM 10/24/2021   10:51 AM  In your present state of health, do you have any difficulty performing the following activities:  Hearing? 0 0  Vision? [place]   Difficulty concentrating or making decisions? 0 0  Walking or climbing stairs? 0 0  Dressing or bathing? 0 0  Doing errands, shopping? 0 0    Patient Care Team: Glean Hess, MD as PCP - General (Internal Medicine) [facility] (Ophthalmology) Earlie Server, MD as Consulting Physician (Oncology) Clyde Canterbury, MD as Referring Physician (Otolaryngology) Reeves Forth (Gastroenterology)  Indicate any recent Medical Services you may have received from other  than [facility] providers in the past year (date may be approximate).     Assessment:   This is a routine wellness examination for Jenna Stein.  Hearing/Vision screen Denies any hearing issues. Denies any vision changes. Wear glasses, Annual Eye Exam, [facility]   Dietary issues and exercise activities discussed: Current Exercise Habits: Structured exercise class, Type of exercise: strength training/weights;walking;Other - see comments, Time (Minutes): 30, Frequency (Times/Week): 3, Weekly Exercise (Minutes/Week): 90, Intensity: Moderate, Exercise limited by: None identified   Goals Addressed   None    Depression Screen    09/15/2022   10:37 AM 08/19/2022    3:20 PM 07/08/2022    2:26 PM 03/07/2022    3:42 PM 10/24/2021   10:50 AM 09/11/2021   10:58 AM 05/24/2021    1:22 PM  PHQ 2/9 Scores  PHQ - 2 Score 0 0 0 1 0 0 1  PHQ- 9 Score  0 '4 3 6  3    '$ Fall Risk    09/15/2022   10:38 AM 08/19/2022    3:21 PM 07/08/2022    2:26 PM 03/07/2022    3:44 PM 10/24/2021   10:51 AM  [place] in the past year? 0 0 0 0 0  Number falls in past yr: 0 0 0 0 0  Injury with Fall? 0 0 0 0 0  Risk for fall due to : No Fall Risks No Fall Risks No Fall Risks No Fall Risks No Fall Risks  Follow up Falls evaluation completed Falls evaluation completed Falls evaluation completed Falls evaluation completed Falls evaluation completed    FALL RISK PREVENTION PERTAINING TO THE HOME:  Any stairs in or around the home? No  If so, are there any without handrails? No  Home free of loose throw rugs in walkways, pet beds, electrical cords, etc? Yes  Adequate lighting in your home to reduce risk of falls? Yes   ASSISTIVE DEVICES UTILIZED TO PREVENT FALLS:  Life alert? No  Use of a cane, walker or w/c? No Grab bars in the bathroom? Yes  Shower chair or bench in shower? Yes Elevated toilet seat or a handicapped toilet? Yes   TIMED UP AND GO:  Was the test performed?  unable to perform, virtual appt  .    Cognitive Function:        09/15/2022   10:40 AM 03/08/2018    2:05 PM 02/16/2017    2:17 PM  6CIT Screen  What Year? 0  points 0 points 0 points  What month? 0 points 0 points 0 points  What time? 0 points 0 points 0 points  Count back from 20 0 points 0 points 0 points  Months in reverse 0 points 2 points 0 points  Repeat phrase 0 points 0 points 0 points  Total Score 0 points 2 points 0 points    Immunizations Immunization History  Administered Date(s) Administered   Fluad Quad(high Dose 65+) 05/24/2021   Influenza Split 04/18/2011, 05/24/2012   Influenza, High Dose Seasonal PF 07/03/2017, 04/29/2018   Influenza,inj,Quad PF,6+ Mos 05/02/2013, 05/03/2014, 05/09/2015   Influenza,inj,quad, With Preservative 07/18/2016   Influenza-Unspecified 05/03/2018, 05/18/2020, 06/16/2022   PFIZER(Purple Top)SARS-COV-2 Vaccination 09/27/2019, 10/18/2019, 04/18/2020   Pneumococcal Conjugate-13 10/10/2016   Pneumococcal Polysaccharide-23 08/07/2017   Td 08/04/2003   Tdap 08/04/2003, 04/18/2011   Zoster Recombinat (Shingrix) 04/29/2018, 07/29/2018   Zoster, Live 01/12/2013    TDAP status: Due, Education has been provided regarding the importance of this  vaccine. Advised may receive this vaccine at local pharmacy or Health Dept. Aware to provide a copy of the vaccination record if obtained from local pharmacy or Health Dept. Verbalized acceptance and understanding.  Flu Vaccine status: Up to date  Pneumococcal vaccine status: Up to date  Covid-19 vaccine status: Information provided on how to obtain vaccines.   Qualifies for Shingles Vaccine? Yes   Zostavax completed Yes   Shingrix Completed?: Yes  Screening Tests Health Maintenance  Topic Date Due   DTaP/Tdap/Td (4 - Td or Tdap) 04/17/2021   COVID-19 Vaccine (4 - 2023-24 season) 04/18/2022   Diabetic kidney evaluation - Urine ACR  10/25/2022   FOOT EXAM  10/25/2022   MAMMOGRAM  12/11/2022   OPHTHALMOLOGY EXAM  12/13/2022   HEMOGLOBIN A1C  01/06/2023   Diabetic kidney evaluation - eGFR measurement  08/06/2023   Medicare Annual Wellness (AWV)   09/16/2023   COLONOSCOPY (Pts 45-32yr Insurance coverage will need to be confirmed)  09/18/2031   Pneumonia Vaccine 72 Years old  Completed   INFLUENZA VACCINE  Completed   DEXA SCAN  Completed   Hepatitis C Screening  Completed   Zoster Vaccines- Shingrix  Completed   HPV VACCINES  Aged Out    Health Maintenance  Health Maintenance Due  Topic Date Due   DTaP/Tdap/Td (4 - Td or Tdap) 04/17/2021   COVID-19 Vaccine (4 - 2023-24 season) 04/18/2022    Colorectal cancer screening: Type of screening: Colonoscopy. Completed 01/312023. Repeat every 10 years  Mammogram status: Completed 12/10/2021. Repeat every year  DEXA scan: 12/06/2020  Lung Cancer Screening: (Low Dose CT Chest recommended if Age 72-80years, 30 pack-year currently smoking OR have quit w/in 15years.) does not qualify.   Lung Cancer Screening Referral: not applicable   Additional Screening:  Hepatitis C Screening: does qualify; Completed 12/26/2015  Vision Screening: Recommended annual ophthalmology exams for early detection of glaucoma and other disorders of the eye. Is the patient up to date with their annual eye exam?  Yes  Who is the provider or what is the name of the [facility] in which the patient attends annual [facility] exams[facility] If pt is not established with a provider, would they like to be referred to a provider to establish care? No .   Dental Screening: Recommended annual dental exams for proper oral hygiene  Community Resource Referral / Chronic Care Management: CRR required this visit?  No   CCM required this visit?  No      Plan:     I have personally reviewed and noted the following in the patient's chart:   Medical and social history Use of alcohol, tobacco or illicit drugs  Current medications and supplements including opioid prescriptions. Patient is not currently taking opioid prescriptions. Functional ability and status Nutritional status Physical activity Advanced  directives List of other physicians Hospitalizations, surgeries, and ER visits in previous 12 months Vitals Screenings to include cognitive, depression, and falls Referrals and appointments  In addition, I have reviewed and discussed with patient certain preventive protocols, quality metrics, and best practice recommendations. A written personalized care plan for preventive services as well as general preventive health recommendations were provided to patient.    Ms. KGinther, Thank you for taking time to come for your Medicare Wellness Visit. I appreciate your ongoing commitment to your health goals. Please review the following plan we discussed and let me know if I can assist you in the future.   These are the  goals we discussed:  Goals      DIET - INCREASE WATER INTAKE     Recommend to drink at least 6-8 8oz glasses of water per day.        This is a list of the screening recommended for you and due dates:  Health Maintenance  Topic Date Due   DTaP/Tdap/Td vaccine (4 - Td or Tdap) 04/17/2021   COVID-19 Vaccine (4 - 2023-24 season) 04/18/2022   Yearly kidney health urinalysis for diabetes  10/25/2022   Complete foot exam   10/25/2022   Mammogram  12/11/2022   Eye exam for diabetics  12/13/2022   Hemoglobin A1C  01/06/2023   Yearly kidney function blood test for diabetes  08/06/2023   Medicare Annual Wellness Visit  09/16/2023   Colon Cancer Screening  09/18/2031   Pneumonia Vaccine  Completed   Flu Shot  Completed   DEXA scan (bone density measurement)  Completed   Hepatitis C Screening: USPSTF Recommendation to screen - Ages 29-79 yo.  Completed   Zoster (Shingles) Vaccine  Completed   HPV Vaccine  Aged [address], [place]   09/15/2022   Nurse Notes: Approximately 30 minute Non-Face -To-Face Medicare Wellness Visit

## 2022-10-27 ENCOUNTER — Encounter: Payer: Medicare Other | Admitting: Internal Medicine

## 2022-11-03 ENCOUNTER — Encounter: Payer: Self-pay | Admitting: Internal Medicine

## 2022-11-03 ENCOUNTER — Ambulatory Visit (INDEPENDENT_AMBULATORY_CARE_PROVIDER_SITE_OTHER): Payer: Medicare Other | Admitting: Internal Medicine

## 2022-11-03 VITALS — BP 136/81 | HR 75 | Ht 64.0 in | Wt 138.0 lb

## 2022-11-03 DIAGNOSIS — C9211 Chronic myeloid leukemia, BCR/ABL-positive, in remission: Secondary | ICD-10-CM | POA: Diagnosis not present

## 2022-11-03 DIAGNOSIS — F324 Major depressive disorder, single episode, in partial remission: Secondary | ICD-10-CM

## 2022-11-03 DIAGNOSIS — I10 Essential (primary) hypertension: Secondary | ICD-10-CM | POA: Diagnosis not present

## 2022-11-03 DIAGNOSIS — E118 Type 2 diabetes mellitus with unspecified complications: Secondary | ICD-10-CM | POA: Diagnosis not present

## 2022-11-03 DIAGNOSIS — Z1231 Encounter for screening mammogram for malignant neoplasm of breast: Secondary | ICD-10-CM | POA: Diagnosis not present

## 2022-11-03 DIAGNOSIS — E785 Hyperlipidemia, unspecified: Secondary | ICD-10-CM

## 2022-11-03 DIAGNOSIS — K219 Gastro-esophageal reflux disease without esophagitis: Secondary | ICD-10-CM | POA: Diagnosis not present

## 2022-11-03 DIAGNOSIS — E1169 Type 2 diabetes mellitus with other specified complication: Secondary | ICD-10-CM

## 2022-11-03 MED ORDER — VALSARTAN 320 MG PO TABS: 320.0000 mg | ORAL_TABLET | Freq: Every day | ORAL | 1 refills | Status: DC

## 2022-11-03 MED ORDER — ATENOLOL 50 MG PO TABS: 50.0000 mg | ORAL_TABLET | Freq: Every day | ORAL | 1 refills | Status: DC

## 2022-11-03 MED ORDER — AMLODIPINE BESYLATE 2.5 MG PO TABS: 2.5000 mg | ORAL_TABLET | Freq: Every day | ORAL | 1 refills | Status: DC

## 2022-11-03 MED ORDER — ATORVASTATIN CALCIUM 10 MG PO TABS: 10.0000 mg | ORAL_TABLET | Freq: Every day | ORAL | 1 refills | Status: DC

## 2022-11-03 MED ORDER — METFORMIN HCL 1000 MG PO TABS: 1000.0000 mg | ORAL_TABLET | Freq: Two times a day (BID) | ORAL | 1 refills | Status: DC

## 2022-11-03 MED ORDER — SITAGLIPTIN PHOSPHATE 100 MG PO TABS: 100.0000 mg | ORAL_TABLET | Freq: Every day | ORAL | 1 refills | Status: DC

## 2022-11-03 MED ORDER — PAROXETINE HCL 20 MG PO TABS: 20.0000 mg | ORAL_TABLET | Freq: Every day | ORAL | 1 refills | Status: DC

## 2022-11-03 MED ORDER — HYDROCHLOROTHIAZIDE 25 MG PO TABS: 25.0000 mg | ORAL_TABLET | Freq: Every day | ORAL | 1 refills | Status: DC

## 2022-11-03 MED ORDER — OMEPRAZOLE 20 MG PO CPDR: 20.0000 mg | DELAYED_RELEASE_CAPSULE | Freq: Every day | ORAL | 1 refills | Status: DC

## 2022-11-03 NOTE — Progress Notes (Signed)
Date:  11/03/2022   Name:  Jenna Stein   DOB:  1951/02/13   MRN:  [id number]   Chief Complaint: Annual Exam Jenna Stein is a 72 y.o. female who presents today for her Complete Annual Exam. She feels well. She reports exercising. She reports she is sleeping fairly well. Breast complaints none.  Mammogram: 11/2021 DEXA: 11/2020 Normal Colonoscopy: 08/2021  Health Maintenance Due  Topic Date Due   COVID-19 Vaccine (4 - 2023-24 season) 04/18/2022   Diabetic kidney evaluation - Urine ACR  10/25/2022    Immunization History  Administered Date(s) Administered   Fluad Quad(high Dose 65+) 05/24/2021   Influenza Split 04/18/2011, 05/24/2012   Influenza, High Dose Seasonal PF 07/03/2017, 04/29/2018   Influenza,inj,Quad PF,6+ Mos 05/02/2013, 05/03/2014, 05/09/2015   Influenza,inj,quad, With Preservative 07/18/2016   Influenza-Unspecified 05/03/2018, 05/18/2020, 06/16/2022   PFIZER(Purple Top)SARS-COV-2 Vaccination 09/27/2019, 10/18/2019, 04/18/2020   Pneumococcal Conjugate-13 10/10/2016   Pneumococcal Polysaccharide-23 08/07/2017   Td 08/04/2003   Tdap 08/04/2003, 04/18/2011   Zoster Recombinat (Shingrix) 04/29/2018, 07/29/2018   Zoster, Live 01/12/2013    Hypertension This is a chronic problem. The problem is controlled. Pertinent negatives include no chest pain, headaches, palpitations or shortness of breath. Past treatments include diuretics, beta blockers, calcium channel blockers and angiotensin blockers. The current treatment provides significant improvement. There is no history of kidney disease, CAD/MI or CVA.  Diabetes She presents for her follow-up diabetic visit. She has type 2 diabetes mellitus. Her disease course has been stable. Pertinent negatives for hypoglycemia include no dizziness, headaches, nervousness/anxiousness or tremors. Pertinent negatives for diabetes include no chest pain, no fatigue, no polydipsia and no polyuria. Pertinent negatives for diabetic  complications include no CVA. Current diabetic treatment includes oral agent (dual therapy) (metformin and januvia).  Hyperlipidemia This is a chronic problem. The problem is controlled. Pertinent negatives include no chest pain or shortness of breath. Current antihyperlipidemic treatment includes statins.    Lab Results  Component Value Date   NA 138 08/05/2022   K 3.4 (L) 08/05/2022   CO2 28 08/05/2022   GLUCOSE 109 (H) 08/05/2022   BUN 18 08/05/2022   CREATININE 0.94 08/05/2022   CALCIUM 8.9 08/05/2022   EGFR 69 10/24/2021   GFRNONAA >60 08/05/2022   Lab Results  Component Value Date   CHOL 115 10/24/2021   HDL 54 10/24/2021   LDLCALC 46 10/24/2021   TRIG 75 10/24/2021   CHOLHDL 2.1 10/24/2021   Lab Results  Component Value Date   TSH 1.860 10/24/2021   Lab Results  Component Value Date   HGBA1C 5.6 07/08/2022   Lab Results  Component Value Date   WBC 4.1 08/05/2022   HGB 11.1 (L) 08/05/2022   HCT 31.6 (L) 08/05/2022   MCV 93.5 08/05/2022   PLT 190 08/05/2022   Lab Results  Component Value Date   ALT 23 08/05/2022   AST 31 08/05/2022   ALKPHOS 37 (L) 08/05/2022   BILITOT 0.6 08/05/2022   Lab Results  Component Value Date   VD25OH 29.3 (L) 05/07/2015     Review of Systems  Constitutional:  Negative for chills, fatigue and fever.  HENT:  Negative for congestion, hearing loss, tinnitus, trouble swallowing and voice change.   Eyes:  Negative for visual disturbance.  Respiratory:  Negative for cough, chest tightness, shortness of breath and wheezing.   Cardiovascular:  Negative for chest pain, palpitations and leg swelling.  Gastrointestinal:  Negative for abdominal pain, constipation, diarrhea and vomiting.  Endocrine:  Negative for polydipsia and polyuria.  Genitourinary:  Negative for dysuria, frequency, genital sores, vaginal bleeding and vaginal discharge.  Musculoskeletal:  Negative for arthralgias, gait problem and joint swelling.  Skin:  Negative  for color change and rash.  Neurological:  Negative for dizziness, tremors, light-headedness and headaches.  Hematological:  Negative for adenopathy. Does not bruise/bleed easily.  Psychiatric/Behavioral:  Negative for dysphoric mood and sleep disturbance. The patient is not nervous/anxious.     Patient Active Problem List   Diagnosis Date Noted   Encounter for antineoplastic chemotherapy 05/06/2022   Periorbital edema of both eyes 05/06/2022   Chemotherapy induced diarrhea 05/06/2022   Anemia due to antineoplastic chemotherapy 05/06/2022   Hypokalemia 05/06/2022   Chemotherapy-induced neutropenia ([place]) 10/24/2021   Gastroesophageal reflux disease with esophagitis 10/24/2021   BCR/ABL1-positive chronic myeloid leukemia (CML) in remission ([place]) 11/17/2020   Abnormal blood smear 10/25/2020   Type II diabetes mellitus with complication ([place]) [id number]   Dry mouth 10/10/2016   Allergic rhinitis, seasonal 04/30/2015   Hepatic steatosis 04/30/2015   Osteoarthritis of both hands 04/30/2015   Genital herpes 04/04/2009   Depression, major, single episode, in partial remission ([place]) 08/18/1998   Essential hypertension 08/18/1998   Hyperlipidemia associated with type 2 diabetes mellitus ([place]) 08/18/1998   Primary insomnia 08/18/1998    Allergies  Allergen Reactions   Clonazepam Other (See Comments)    Nocturnal enuresis; Lethargy    Trazodone Anxiety and Other (See Comments)    Dizziness     Past Surgical History:  Procedure Laterality Date   CYST EXCISION Left 2017   foot   EYE SURGERY  11/16/1996   myopia laser surgery   HAND SURGERY  07/2012   MASS EXCISION Right 08/13/2021   Procedure: EXCISION LOWER LIP MASS;  Surgeon: Clyde Canterbury, MD;  Location: [facility];  Service: ENT;  Laterality: Right;  Diabetic    Social History   Tobacco Use   Smoking status: Never   Smokeless tobacco: Never   Tobacco comments:    smoking cessation materials not required  Vaping  Use   Vaping Use: Never used  Substance Use Topics   Alcohol use: No   Drug use: No     Medication list has been reviewed and updated.  Current Meds  Medication Sig   acyclovir (ZOVIRAX) 200 MG capsule Take 1 capsule (200 mg total) by mouth 5 (five) times daily.   Coenzyme Q10 (CO Q 10) 100 MG CAPS Take 2 capsules by mouth daily.   estazolam (PROSOM) 2 MG tablet TAKE 1 TABLET BY MOUTH EVERYDAY AT BEDTIME   glucose blood (FREESTYLE LITE) test strip Use to test BS twice daily   hydrocortisone cream 0.5 % Apply 1 application topically 2 (two) times daily. Apply to periorbital area twice daily.   imatinib (GLEEVEC) 400 MG tablet Take 1 tablet (400 mg total) by mouth daily. Take with meals and large glass of water.Caution:Chemotherapy.   Lancets (FREESTYLE) lancets Use to test BS once daily   Lutein 20 MG TABS Take 1 tablet by mouth daily.    MULTIPLE VITAMIN PO 1 tablet daily.    olopatadine (PATANOL) 0.1 % ophthalmic solution Apply to eye.   Omega-3 Fatty Acids (FISH OIL PEARLS PO) Take by mouth.   ondansetron (ZOFRAN) 4 MG tablet Take 1 tablet (4 mg total) by mouth every 8 (eight) hours as needed for nausea or vomiting.   potassium chloride (KLOR-CON M) 10 MEQ tablet Take 1 tablet (10 mEq total) by mouth  daily.   prednisoLONE acetate (PRED FORTE) 1 % ophthalmic suspension Place 1 drop into the right eye 4 (four) times daily.   SELENIUM PO Take by mouth daily.   triamcinolone cream (KENALOG) 0.5 % APPLY 1 APPLICATION TOPICALLY 3 (THREE) TIMES DAILY. TO RASH ON ABDOMEN   TURMERIC PO Take 800 mg by mouth.   Vitamin D, Ergocalciferol, (DRISDOL) 1.25 MG (50000 UNIT) CAPS capsule Take 50,000 Units by mouth every 7 (seven) days.   vitamin E (VITAMIN E) 400 UNIT capsule Take 1 capsule (400 Units total) by mouth daily.   zinc gluconate 50 MG tablet Take 50 mg by mouth daily.   [DISCONTINUED] amLODipine (NORVASC) 2.5 MG tablet Take 1 tablet (2.5 mg total) by mouth daily.   [DISCONTINUED]  atenolol (TENORMIN) 50 MG tablet Take 1 tablet (50 mg total) by mouth daily.   [DISCONTINUED] atorvastatin (LIPITOR) 10 MG tablet Take 1 tablet (10 mg total) by mouth at bedtime.   [DISCONTINUED] hydrochlorothiazide (HYDRODIURIL) 25 MG tablet Take 1 tablet (25 mg total) by mouth daily.   [DISCONTINUED] metFORMIN (GLUCOPHAGE) 1000 MG tablet Take 1 tablet (1,000 mg total) by mouth 2 (two) times daily with a meal.   [DISCONTINUED] omeprazole (PRILOSEC) 20 MG capsule Take 1 capsule (20 mg total) by mouth daily.   [DISCONTINUED] PARoxetine (PAXIL) 20 MG tablet Take 1 tablet (20 mg total) by mouth daily.   [DISCONTINUED] sitaGLIPtin (JANUVIA) 100 MG tablet Take 1 tablet (100 mg total) by mouth daily.   [DISCONTINUED] valsartan (DIOVAN) 320 MG tablet Take 1 tablet (320 mg total) by mouth daily.       08/19/2022    3:20 PM 07/08/2022    2:26 PM 03/07/2022    3:44 PM 10/24/2021   10:51 AM  GAD 7 : Generalized Anxiety Score  Nervous, Anxious, on Edge 0 1 1 0  Control/stop worrying 0 0 0 0  Worry too much - different things 0 2 0 0  Trouble relaxing 0 0 0 0  Restless 0 0 0 0  Easily annoyed or irritable 0 0 0 0  Afraid - awful might happen 0 0 0 0  Total GAD 7 Score 0 3 1 0  Anxiety Difficulty Not difficult at all Not difficult at all Not difficult at all Not difficult at all       09/15/2022   10:37 AM 08/19/2022    3:20 PM 07/08/2022    2:26 PM  Depression screen PHQ 2/9  Decreased Interest 0 0 0  Down, Depressed, Hopeless 0 0 0  PHQ - 2 Score 0 0 0  Altered sleeping  0 0  Tired, decreased energy  0 2  Change in appetite  0 2  Feeling bad or failure about yourself   0 0  Trouble concentrating  0 0  Moving slowly or fidgety/restless  0 0  Suicidal thoughts  0 0  PHQ-9 Score  0 4  Difficult doing work/chores  Not difficult at all Not difficult at all    BP Readings from Last 3 Encounters:  11/03/22 136/81  08/19/22 128/70  08/19/22 (!) 140/86    Physical Exam Vitals and nursing  note reviewed.  Constitutional:      General: She is not in acute distress.    Appearance: She is well-developed.  HENT:     Head: Normocephalic and atraumatic.     Right Ear: Tympanic membrane and ear canal normal.     Left Ear: Tympanic membrane and ear canal normal.  Nose:     Right Sinus: No maxillary sinus tenderness.     Left Sinus: No maxillary sinus tenderness.  Eyes:     General: No scleral icterus.       Right eye: No discharge.        Left eye: No discharge.     Conjunctiva/sclera: Conjunctivae normal.  Neck:     Thyroid: No thyromegaly.     Vascular: No carotid bruit.  Cardiovascular:     Rate and Rhythm: Normal rate and regular rhythm.     Pulses: Normal pulses.     Heart sounds: Normal heart sounds.  Pulmonary:     Effort: Pulmonary effort is normal. No respiratory distress.     Breath sounds: No wheezing.  Chest:  Breasts:    Right: No mass, nipple discharge, skin change or tenderness.     Left: No mass, nipple discharge, skin change or tenderness.  Abdominal:     General: Bowel sounds are normal.     Palpations: Abdomen is soft.     Tenderness: There is no abdominal tenderness.  Musculoskeletal:     Cervical back: Normal range of motion. No erythema.     Right lower leg: No edema.     Left lower leg: No edema.  Lymphadenopathy:     Cervical: No cervical adenopathy.  Skin:    General: Skin is warm and dry.     Findings: No rash.  Neurological:     Mental Status: She is alert and oriented to person, place, and time.     Cranial Nerves: No cranial nerve deficit.     Sensory: No sensory deficit.     Deep Tendon Reflexes: Reflexes are normal and symmetric.  Psychiatric:        Attention and Perception: Attention normal.        Mood and Affect: Mood normal.    Diabetic Foot Exam - Simple   Simple Foot Form Diabetic Foot exam was performed with the following findings: Yes 11/03/2022 11:23 AM  Visual Inspection No deformities, no ulcerations, no  other skin breakdown bilaterally: Yes Sensation Testing Intact to touch and monofilament testing bilaterally: Yes Pulse Check Posterior Tibialis and Dorsalis pulse intact bilaterally: Yes Comments      Wt Readings from Last 3 Encounters:  11/03/22 138 lb (62.6 kg)  08/19/22 139 lb (63 kg)  08/19/22 139 lb 3.2 oz (63.1 kg)    BP 136/81 Comment: Patients BP cuff- brought cuff to appointment today  Pulse 75   Ht 5\' 4"  (1.626 m)   Wt 138 lb (62.6 kg)   SpO2 98%   BMI 23.69 kg/m   Assessment and Plan:  Problem List Items Addressed This Visit       Cardiovascular and Mediastinum   Essential hypertension (Chronic)    Clinically stable exam with well controlled BP on amlodipine, atenolol, hctz, valsartan. Tolerating medications without side effects. Pt to continue current regimen and low sodium diet.       Relevant Medications   atorvastatin (LIPITOR) 10 MG tablet   valsartan (DIOVAN) 320 MG tablet   hydrochlorothiazide (HYDRODIURIL) 25 MG tablet   atenolol (TENORMIN) 50 MG tablet   amLODipine (NORVASC) 2.5 MG tablet   Other Relevant Orders   Comprehensive metabolic panel   TSH     Endocrine   Hyperlipidemia associated with type 2 diabetes mellitus (HCC) (Chronic)    Tolerating statin medications without concerns LDL is  Lab Results  Component Value Date   LDLCALC 46  10/24/2021         Relevant Medications   sitaGLIPtin (JANUVIA) 100 MG tablet   metFORMIN (GLUCOPHAGE) 1000 MG tablet   atorvastatin (LIPITOR) 10 MG tablet   valsartan (DIOVAN) 320 MG tablet   hydrochlorothiazide (HYDRODIURIL) 25 MG tablet   atenolol (TENORMIN) 50 MG tablet   amLODipine (NORVASC) 2.5 MG tablet   Other Relevant Orders   Lipid panel   Type II diabetes mellitus with complication (HCC) - Primary (Chronic)    Clinically stable without s/s of hypoglycemia. Tolerating metformin and januvia well without side effects or other concerns. Lab Results  Component Value Date   HGBA1C 5.6  07/08/2022        Relevant Medications   sitaGLIPtin (JANUVIA) 100 MG tablet   metFORMIN (GLUCOPHAGE) 1000 MG tablet   atorvastatin (LIPITOR) 10 MG tablet   valsartan (DIOVAN) 320 MG tablet   Other Relevant Orders   Hemoglobin A1c   Microalbumin / creatinine urine ratio     Other   BCR/ABL1-positive chronic myeloid leukemia (CML) in remission (HCC) (Chronic)    Doing well with medication and may be near remission Oncology is considering stopping medications in the near future      Depression, major, single episode, in partial remission (HCC) (Chronic)   Relevant Medications   PARoxetine (PAXIL) 20 MG tablet   Other Visit Diagnoses     Encounter for screening mammogram for breast cancer       Relevant Orders   MM 3D SCREENING MAMMOGRAM BILATERAL BREAST   Gastroesophageal reflux disease, unspecified whether esophagitis present       Relevant Medications   omeprazole (PRILOSEC) 20 MG capsule       Return in about 4 months (around 03/05/2023) for DM, HTN.   Partially dictated using [place], any errors are not intentional.  Glean Hess, MD [place], [place]

## 2022-11-03 NOTE — Assessment & Plan Note (Addendum)
Doing well with medication and may be near remission Oncology is considering stopping medications in the near future

## 2022-11-03 NOTE — Assessment & Plan Note (Signed)
Tolerating statin medications without concerns LDL is  Lab Results  Component Value Date   LDLCALC 46 10/24/2021

## 2022-11-03 NOTE — Patient Instructions (Signed)
Call [facility] to schedule your mammogram at [phone].  

## 2022-11-03 NOTE — Assessment & Plan Note (Signed)
Clinically stable without s/s of hypoglycemia. Tolerating metformin and januvia well without side effects or other concerns. Lab Results  Component Value Date   HGBA1C 5.6 07/08/2022

## 2022-11-03 NOTE — Assessment & Plan Note (Signed)
Clinically stable exam with well controlled BP on amlodipine, atenolol, hctz, valsartan. Tolerating medications without side effects. Pt to continue current regimen and low sodium diet.

## 2022-11-04 LAB — HEMOGLOBIN A1C
Est. average glucose Bld gHb Est-mCnc: 114 mg/dL
Hgb A1c MFr Bld: 5.6 % (ref 4.8–5.6)

## 2022-11-04 LAB — MICROALBUMIN / CREATININE URINE RATIO
Creatinine, Urine: 126.4 mg/dL
Microalb/Creat Ratio: 10 mg/g creat (ref 0–29)
Microalbumin, Urine: 12.9 ug/mL

## 2022-11-04 LAB — COMPREHENSIVE METABOLIC PANEL
ALT: 19 IU/L (ref 0–32)
AST: 26 IU/L (ref 0–40)
Albumin/Globulin Ratio: 2.2 (ref 1.2–2.2)
Albumin: 4.3 g/dL (ref 3.8–4.8)
Alkaline Phosphatase: 38 IU/L — ABNORMAL LOW (ref 44–121)
BUN/Creatinine Ratio: 11 — ABNORMAL LOW (ref 12–28)
BUN: 11 mg/dL (ref 8–27)
Bilirubin Total: 0.6 mg/dL (ref 0.0–1.2)
CO2: 25 mmol/L (ref 20–29)
Calcium: 9.1 mg/dL (ref 8.7–10.3)
Chloride: 105 mmol/L (ref 96–106)
Creatinine, Ser: 1.02 mg/dL — ABNORMAL HIGH (ref 0.57–1.00)
Globulin, Total: 2 g/dL (ref 1.5–4.5)
Glucose: 98 mg/dL (ref 70–99)
Potassium: 4.1 mmol/L (ref 3.5–5.2)
Sodium: 143 mmol/L (ref 134–144)
Total Protein: 6.3 g/dL (ref 6.0–8.5)
eGFR: 59 mL/min/{1.73_m2} — ABNORMAL LOW (ref >59)

## 2022-11-04 LAB — LIPID PANEL
Chol/HDL Ratio: 2.3 ratio (ref 0.0–4.4)
Cholesterol, Total: 110 mg/dL (ref 100–199)
HDL: 48 mg/dL (ref >39)
LDL Chol Calc (NIH): 42 mg/dL (ref 0–99)
Triglycerides: 111 mg/dL (ref 0–149)
VLDL Cholesterol Cal: 20 mg/dL (ref 5–40)

## 2022-11-04 LAB — TSH: TSH: 1.02 u[IU]/mL (ref 0.450–4.500)

## 2022-11-06 DIAGNOSIS — M65311 Trigger thumb, right thumb: Secondary | ICD-10-CM | POA: Diagnosis not present

## 2022-11-06 DIAGNOSIS — M65312 Trigger thumb, left thumb: Secondary | ICD-10-CM | POA: Diagnosis not present

## 2022-11-06 DIAGNOSIS — M65321 Trigger finger, right index finger: Secondary | ICD-10-CM | POA: Diagnosis not present

## 2022-11-18 ENCOUNTER — Inpatient Hospital Stay: Payer: Medicare Other | Attending: Oncology

## 2022-11-18 DIAGNOSIS — E119 Type 2 diabetes mellitus without complications: Secondary | ICD-10-CM | POA: Diagnosis not present

## 2022-11-18 DIAGNOSIS — I10 Essential (primary) hypertension: Secondary | ICD-10-CM | POA: Insufficient documentation

## 2022-11-18 DIAGNOSIS — C9211 Chronic myeloid leukemia, BCR/ABL-positive, in remission: Secondary | ICD-10-CM | POA: Insufficient documentation

## 2022-11-18 LAB — COMPREHENSIVE METABOLIC PANEL
ALT: 21 U/L (ref 0–44)
AST: 31 U/L (ref 15–41)
Albumin: 4.1 g/dL (ref 3.5–5.0)
Alkaline Phosphatase: 34 U/L — ABNORMAL LOW (ref 38–126)
Anion gap: 5 (ref 5–15)
BUN: 14 mg/dL (ref 8–23)
CO2: 28 mmol/L (ref 22–32)
Calcium: 9.1 mg/dL (ref 8.9–10.3)
Chloride: 98 mmol/L (ref 98–111)
Creatinine, Ser: 1.01 mg/dL — ABNORMAL HIGH (ref 0.44–1.00)
GFR, Estimated: 60 mL/min — ABNORMAL LOW (ref >60)
Glucose, Bld: 98 mg/dL (ref 70–99)
Potassium: 3.5 mmol/L (ref 3.5–5.1)
Sodium: 131 mmol/L — ABNORMAL LOW (ref 135–145)
Total Bilirubin: 0.7 mg/dL (ref 0.3–1.2)
Total Protein: 6.4 g/dL — ABNORMAL LOW (ref 6.5–8.1)

## 2022-11-18 LAB — CBC WITH DIFFERENTIAL/PLATELET
Abs Immature Granulocytes: 0.01 10*3/uL (ref 0.00–0.07)
Basophils Absolute: 0.1 10*3/uL (ref 0.0–0.1)
Basophils Relative: 1 %
Eosinophils Absolute: 0.4 10*3/uL (ref 0.0–0.5)
Eosinophils Relative: 8 %
HCT: 31.4 % — ABNORMAL LOW (ref 36.0–46.0)
Hemoglobin: 11 g/dL — ABNORMAL LOW (ref 12.0–15.0)
Immature Granulocytes: 0 %
Lymphocytes Relative: 41 %
Lymphs Abs: 1.9 10*3/uL (ref 0.7–4.0)
MCH: 33.2 pg (ref 26.0–34.0)
MCHC: 35 g/dL (ref 30.0–36.0)
MCV: 94.9 fL (ref 80.0–100.0)
Monocytes Absolute: 0.4 10*3/uL (ref 0.1–1.0)
Monocytes Relative: 9 %
Neutro Abs: 1.9 10*3/uL (ref 1.7–7.7)
Neutrophils Relative %: 41 %
Platelets: 176 10*3/uL (ref 150–400)
RBC: 3.31 MIL/uL — ABNORMAL LOW (ref 3.87–5.11)
RDW: 13.1 % (ref 11.5–15.5)
WBC: 4.6 10*3/uL (ref 4.0–10.5)
nRBC: 0 % (ref 0.0–0.2)

## 2022-11-21 LAB — BCR-ABL1 FISH
Cells Analyzed: 200
Cells Counted: 200

## 2022-11-24 LAB — BCR-ABL1, CML/ALL, PCR, QUANT: Interpretation (BCRAL):: NEGATIVE

## 2022-12-02 ENCOUNTER — Inpatient Hospital Stay (HOSPITAL_BASED_OUTPATIENT_CLINIC_OR_DEPARTMENT_OTHER): Payer: Medicare Other | Admitting: Oncology

## 2022-12-02 ENCOUNTER — Encounter: Payer: Self-pay | Admitting: Oncology

## 2022-12-02 VITALS — BP 133/62 | HR 66 | Temp 96.8°F | Resp 18 | Wt 141.7 lb

## 2022-12-02 DIAGNOSIS — T451X5A Adverse effect of antineoplastic and immunosuppressive drugs, initial encounter: Secondary | ICD-10-CM

## 2022-12-02 DIAGNOSIS — K521 Toxic gastroenteritis and colitis: Secondary | ICD-10-CM | POA: Diagnosis not present

## 2022-12-02 DIAGNOSIS — E876 Hypokalemia: Secondary | ICD-10-CM

## 2022-12-02 DIAGNOSIS — C9211 Chronic myeloid leukemia, BCR/ABL-positive, in remission: Secondary | ICD-10-CM

## 2022-12-02 DIAGNOSIS — D6481 Anemia due to antineoplastic chemotherapy: Secondary | ICD-10-CM

## 2022-12-02 DIAGNOSIS — R7989 Other specified abnormal findings of blood chemistry: Secondary | ICD-10-CM

## 2022-12-02 DIAGNOSIS — E119 Type 2 diabetes mellitus without complications: Secondary | ICD-10-CM | POA: Diagnosis not present

## 2022-12-02 DIAGNOSIS — Z5111 Encounter for antineoplastic chemotherapy: Secondary | ICD-10-CM

## 2022-12-02 DIAGNOSIS — I10 Essential (primary) hypertension: Secondary | ICD-10-CM | POA: Diagnosis not present

## 2022-12-02 MED ORDER — IMATINIB MESYLATE 400 MG PO TABS: 400.0000 mg | ORAL_TABLET | Freq: Every day | ORAL | 1 refills | Status: DC

## 2022-12-02 MED ORDER — LOPERAMIDE HCL 2 MG PO CAPS: 2.0000 mg | ORAL_CAPSULE | ORAL | 2 refills | Status: DC

## 2022-12-02 NOTE — Assessment & Plan Note (Signed)
Possibly from [facility], or could be from her other medical condition - DM.  Encourage oral hydration and avoid nephrotoxins.

## 2022-12-02 NOTE — Assessment & Plan Note (Addendum)
Chronic myelogenous leukemia-low risk disease [[place], has 4, ELTS] Labs reviewed and discussed with patient. BCR ABL FISH negative. BCR ABL QT PCR <0.0032%  She is in MR4, options of long term treatment vs  stop Gleevec after Oct 2024 with close monitoring of CML labs discussed with patient.  Continue Gleevac 400 mg daily.  Refilled prescription

## 2022-12-02 NOTE — Progress Notes (Signed)
Pt here for follow up. No new concerns voiced.   

## 2022-12-02 NOTE — Assessment & Plan Note (Addendum)
Continue potassium chloride 10meq daily.  

## 2022-12-02 NOTE — Assessment & Plan Note (Signed)
symptoms are stable.  Continue as needed Imodium PRN as instructed, prescriptions were reviewed.    

## 2022-12-02 NOTE — Assessment & Plan Note (Signed)
Treatment as planned above 

## 2022-12-02 NOTE — Progress Notes (Signed)
Hematology/Oncology Progress note [phone] [phone]     Chief Complaint: Jenna Stein is a 72 y.o. female with chronic phase CML who is seen for management of CML  ASSESSMENT & PLAN:   BCR/ABL1-positive chronic myeloid leukemia (CML) in remission Chronic myelogenous leukemia-low risk disease [Sokal, has 4, ELTS] Labs reviewed and discussed with patient. BCR ABL FISH negative. BCR ABL QT PCR <0.0032%  She is in MR4, options of long term treatment vs  stop Gleevec after Oct 2024 with close monitoring of CML labs discussed with patient.  Continue Gleevac 400 mg daily.  Refilled prescription  Encounter for antineoplastic chemotherapy Treatment as planned above  Chemotherapy induced diarrhea symptoms are stable.  Continue as needed Imodium PRN as instructed, prescriptions were reviewed.     Anemia due to antineoplastic chemotherapy Hemoglobin stable.  Continue monitor  Hypokalemia Continue potassium chloride daily.    Elevated serum creatinine Possibly from Gleevec, or could be from her other medical condition - DM.  Encourage oral hydration and avoid nephrotoxins.     Orders Placed This Encounter  Procedures   CBC with Differential (Cancer Center Only)    Standing Status:   Future    Standing Expiration Date:   12/02/2023   CMP (Cancer Center only)    Standing Status:   Future    Standing Expiration Date:   12/02/2023   BCR-ABL1 FISH    Standing Status:   Future    Standing Expiration Date:   12/02/2023   BCR-ABL1, CML/ALL, PCR, QUANT    Standing Status:   Future    Standing Expiration Date:   12/02/2023   Follow-up in 3 months. All questions were answered. The patient knows to call the clinic with any problems, questions or concerns.  Rickard Patience, MD, PhD [facility] Hematology Oncology 12/02/2022    .  PERTINENT ONCOLOGY HISTORY Jenna Stein is a 72 y.o.afemale who has above oncology history reviewed by me today presented for  follow up visit for management of CML Patient previously followed up by Dr.Corcoran, patient switched care to me on 01/03/2021 Extensive medical record review was performed by me  10/09/2020 abnormal peripheral smear (3% myelocytes) 10/25/2020  lab work-up revealed a hematocrit of 40.8, hemoglobin 13.7, platelets 352,000, WBC 10,200. LDH was 174.  BCR-ABL showed 70% of nuclei positive for BCR/ABL1 fusion.  Flow cytometry revealed left shifted aberrant neutrophils, aberrant monocytes and absolute basophilia.   BCR-ABL1 revealed 123.6615% b2a2, < 0.0032% b3a2, and < 0.0032% E1A2 transcript. The ABL1 e13a2 (b2a2, p210) fusion transcript was positive.   11/05/2020 bone marrow biopsy showed  slightly hypercellular marrow with mild granulocytic proliferation and atypical megakaryocytes. The combined bone marrow and peripheral blood findings were very limited but were generally c/w early involvement by chronic myeloid leukemia, chronic phase especially in the presence of previously documented positivity for BCR/ABL1 fusion in peripheral blood analysis.  Cytogenetics revealed 72, XX, t(9;22)(q34.1; q11.2)[19] / 46, XX [1].  Molecular genetics showed a BCR-ABL1 translocation t(9;22) with 65.5684 % BCR-ABL1/ABL1 (IS), major breakpoint p210, log reduction 0.001.  11/16/2020 abdominal ultrasound showed no splenomegaly (7.2 x 9.5 x 4.4 cm).   Relative risk (RR) Sokal 0.81 (intermediate) and [place]ford 684.83 (low) based on age (55), spleen (0), platelets (309), basophils (6%), eosinophils (3%), and myeloblasts (occ-1%). ELTS risk score 1.5596 (low risk) based on 0% blasts and 1.6648 (intermediate-risk) based on 1% blasts.  11/28/2020, started on Gleevec 400 mg daily.  INTERVAL HISTORY Jenna Stein is a 72 y.o.  female who has above history reviewed by me today presents for follow up visit for management of CML Problems and complaints are listed below:  Patient has been on Gleevec 400 mg daily.   Overall she tolerates  well with manageable toxicities.  +Intermittent diarrhea, she uses Imodium as needed, requests refills.  Noticed left forehead bruising area. She took a picture and showed me.  She was accompanied by her husband.   Past Medical History:  Diagnosis Date   Allergy    Anemia due to antineoplastic chemotherapy 05/06/2022   Anxiety    Arthritis 2010   Chronic myeloid leukemia, BCR/ABL-positive, not having achieved remission 11/17/2020   Depression    Diabetes mellitus without complication    GERD (gastroesophageal reflux disease)    Hyperlipidemia    Hypertension     Past Surgical History:  Procedure Laterality Date   CYST EXCISION Left 2017   foot   EYE SURGERY  11/16/1996   myopia laser surgery   HAND SURGERY  07/2012   MASS EXCISION Right 08/13/2021   Procedure: EXCISION LOWER LIP MASS;  Surgeon: Geanie Logan, MD;  Location: [facility];  Service: ENT;  Laterality: Right;  Diabetic    Family History  Problem Relation Age of Onset   Hypertension Mother    Hyperlipidemia Mother    Arthritis Mother    Parkinsonism Father    Arthritis Other    Hypertension Other    Hyperlipidemia Other    Hypothyroidism Other    Breast cancer Sister 69   Cancer Sister     Social History:  reports that she has never smoked. She has never used smokeless tobacco. She reports that she does not drink alcohol and does not use drugs.   Allergies:  Allergies  Allergen Reactions   Clonazepam Other (See Comments)    Nocturnal enuresis; Lethargy    Trazodone Anxiety and Other (See Comments)    Dizziness     Current Medications: Current Outpatient Medications  Medication Sig Dispense Refill   acyclovir (ZOVIRAX) 200 MG capsule Take 1 capsule (200 mg total) by mouth 5 (five) times daily. 90 capsule 1   amLODipine (NORVASC) 2.5 MG tablet Take 1 tablet (2.5 mg total) by mouth daily. 90 tablet 1   atenolol (TENORMIN) 50 MG tablet Take 1 tablet (50 mg total) by mouth daily. 90 tablet 1    atorvastatin (LIPITOR) 10 MG tablet Take 1 tablet (10 mg total) by mouth at bedtime. 90 tablet 1   Coenzyme Q10 (CO Q 10) 100 MG CAPS Take 2 capsules by mouth daily.     estazolam (PROSOM) 2 MG tablet TAKE 1 TABLET BY MOUTH EVERYDAY AT BEDTIME 30 tablet 5   glucose blood (FREESTYLE LITE) test strip Use to test BS twice daily 100 each 12   hydrochlorothiazide (HYDRODIURIL) 25 MG tablet Take 1 tablet (25 mg total) by mouth daily. 90 tablet 1   hydrocortisone cream 0.5 % Apply 1 application topically 2 (two) times daily. Apply to periorbital area twice daily. 28 g 1   imatinib (GLEEVEC) 400 MG tablet Take 1 tablet (400 mg total) by mouth daily. Take with meals and large glass of water.Caution:Chemotherapy. 90 tablet 1   Lancets (FREESTYLE) lancets Use to test BS once daily 100 each 12   Lutein 20 MG TABS Take 1 tablet by mouth daily.      metFORMIN (GLUCOPHAGE) 1000 MG tablet Take 1 tablet (1,000 mg total) by mouth 2 (two) times daily with a meal. 180  tablet 1   MULTIPLE VITAMIN PO 1 tablet daily.      olopatadine (PATANOL) 0.1 % ophthalmic solution Apply to eye.     Omega-3 Fatty Acids (FISH OIL PEARLS PO) Take by mouth.     omeprazole (PRILOSEC) 20 MG capsule Take 1 capsule (20 mg total) by mouth daily. 90 capsule 1   ondansetron (ZOFRAN) 4 MG tablet Take 1 tablet (4 mg total) by mouth every 8 (eight) hours as needed for nausea or vomiting. 20 tablet 0   PARoxetine (PAXIL) 20 MG tablet Take 1 tablet (20 mg total) by mouth daily. 90 tablet 1   potassium chloride (KLOR-CON M) 10 MEQ tablet Take 1 tablet (10 mEq total) by mouth daily. 90 tablet 1   prednisoLONE acetate (PRED FORTE) 1 % ophthalmic suspension Place 1 drop into the right eye 4 (four) times daily.     SELENIUM PO Take by mouth daily.     sitaGLIPtin (JANUVIA) 100 MG tablet Take 1 tablet (100 mg total) by mouth daily. 90 tablet 1   triamcinolone cream (KENALOG) 0.5 % APPLY 1 APPLICATION TOPICALLY 3 (THREE) TIMES DAILY. TO RASH ON  ABDOMEN 30 g 0   TURMERIC PO Take 800 mg by mouth.     valsartan (DIOVAN) 320 MG tablet Take 1 tablet (320 mg total) by mouth daily. 90 tablet 1   Vitamin D, Ergocalciferol, (DRISDOL) 1.25 MG (50000 UNIT) CAPS capsule Take 50,000 Units by mouth every 7 (seven) days.     vitamin E (VITAMIN E) 400 UNIT capsule Take 1 capsule (400 Units total) by mouth daily. 1 capsule 0   zinc gluconate 50 MG tablet Take 50 mg by mouth daily.     No current facility-administered medications for this visit.    Review of Systems  Constitutional:  Negative for chills, fever, malaise/fatigue and weight loss.  HENT:  Negative for sore throat.   Eyes:  Negative for redness.       Periorbital edema, occasional subconjunctival hemorrhage and bulbar conjunctivae  Respiratory:  Negative for cough, shortness of breath and wheezing.   Cardiovascular:  Negative for chest pain, palpitations and leg swelling.  Gastrointestinal:  Positive for diarrhea. Negative for abdominal pain, blood in stool, nausea and vomiting.  Genitourinary:  Negative for dysuria.  Musculoskeletal:  Positive for joint pain.  Skin:  Negative for rash.  Neurological:  Negative for dizziness, tingling and tremors.  Endo/Heme/Allergies:  Does not bruise/bleed easily.  Psychiatric/Behavioral:  Negative for hallucinations.    Performance status (ECOG): 0 Vitals Blood pressure 133/62, pulse 66, temperature (!) 96.8 F (36 C), resp. rate 18, weight 141 lb 11.2 oz (64.3 kg).  Physical Exam Constitutional:      General: She is not in acute distress.    Appearance: She is not diaphoretic.  HENT:     Head: Normocephalic and atraumatic.     Nose: Nose normal.     Mouth/Throat:     Pharynx: No oropharyngeal exudate.  Eyes:     General: No scleral icterus.    Pupils: Pupils are equal, round, and reactive to light.  Cardiovascular:     Rate and Rhythm: Normal rate and regular rhythm.     Heart sounds: No murmur heard. Pulmonary:     Effort:  Pulmonary effort is normal. No respiratory distress.     Breath sounds: No rales.  Chest:     Chest wall: No tenderness.  Abdominal:     General: There is no distension.  Palpations: Abdomen is soft.     Tenderness: There is no abdominal tenderness.  Musculoskeletal:        General: Normal range of motion.     Cervical back: Normal range of motion and neck supple.  Skin:    General: Skin is warm and dry.     Findings: No erythema.  Neurological:     Mental Status: She is alert and oriented to person, place, and time.     Cranial Nerves: No cranial nerve deficit.     Motor: No abnormal muscle tone.     Coordination: Coordination normal.  Psychiatric:        Mood and Affect: Affect normal.

## 2022-12-02 NOTE — Assessment & Plan Note (Signed)
Hemoglobin stable.  Continue monitor 

## 2022-12-15 DIAGNOSIS — E119 Type 2 diabetes mellitus without complications: Secondary | ICD-10-CM | POA: Diagnosis not present

## 2022-12-15 DIAGNOSIS — H2513 Age-related nuclear cataract, bilateral: Secondary | ICD-10-CM | POA: Diagnosis not present

## 2022-12-15 DIAGNOSIS — H43813 Vitreous degeneration, bilateral: Secondary | ICD-10-CM | POA: Diagnosis not present

## 2022-12-15 LAB — HM DIABETES EYE EXAM

## 2022-12-17 ENCOUNTER — Other Ambulatory Visit: Payer: Self-pay

## 2022-12-17 ENCOUNTER — Encounter: Payer: Self-pay | Admitting: Internal Medicine

## 2022-12-17 DIAGNOSIS — A6000 Herpesviral infection of urogenital system, unspecified: Secondary | ICD-10-CM

## 2022-12-17 DIAGNOSIS — E118 Type 2 diabetes mellitus with unspecified complications: Secondary | ICD-10-CM

## 2022-12-17 MED ORDER — ACYCLOVIR 200 MG PO CAPS
200.0000 mg | ORAL_CAPSULE | Freq: Every day | ORAL | 0 refills | Status: DC
Start: 2022-12-17 — End: 2022-12-17

## 2022-12-17 MED ORDER — FREESTYLE LITE TEST VI STRP
ORAL_STRIP | 4 refills | Status: DC
Start: 2022-12-17 — End: 2022-12-17

## 2022-12-17 MED ORDER — ACYCLOVIR 200 MG PO CAPS
200.0000 mg | ORAL_CAPSULE | Freq: Every day | ORAL | 0 refills | Status: DC
Start: 2022-12-17 — End: 2023-02-09

## 2022-12-17 MED ORDER — FREESTYLE LITE TEST VI STRP
ORAL_STRIP | 4 refills | Status: DC
Start: 2022-12-17 — End: 2023-11-11

## 2022-12-18 DIAGNOSIS — M65311 Trigger thumb, right thumb: Secondary | ICD-10-CM | POA: Diagnosis not present

## 2022-12-18 DIAGNOSIS — M65312 Trigger thumb, left thumb: Secondary | ICD-10-CM | POA: Diagnosis not present

## 2022-12-18 DIAGNOSIS — M65321 Trigger finger, right index finger: Secondary | ICD-10-CM | POA: Diagnosis not present

## 2022-12-23 ENCOUNTER — Ambulatory Visit
Admission: RE | Admit: 2022-12-23 | Discharge: 2022-12-23 | Disposition: A | Discharge status: Home / Self Care | Payer: Medicare Other | Source: Ambulatory Visit | Attending: Internal Medicine | Admitting: Internal Medicine

## 2022-12-23 DIAGNOSIS — Z1231 Encounter for screening mammogram for malignant neoplasm of breast: Secondary | ICD-10-CM | POA: Insufficient documentation

## 2023-02-07 ENCOUNTER — Other Ambulatory Visit: Payer: Self-pay | Admitting: Internal Medicine

## 2023-02-07 DIAGNOSIS — A6000 Herpesviral infection of urogenital system, unspecified: Secondary | ICD-10-CM

## 2023-02-13 ENCOUNTER — Encounter: Payer: Self-pay | Admitting: Internal Medicine

## 2023-02-13 ENCOUNTER — Ambulatory Visit (INDEPENDENT_AMBULATORY_CARE_PROVIDER_SITE_OTHER): Payer: Medicare Other | Admitting: Internal Medicine

## 2023-02-13 VITALS — BP 122/60 | HR 74 | Ht 64.0 in | Wt 138.0 lb

## 2023-02-13 DIAGNOSIS — Z23 Encounter for immunization: Secondary | ICD-10-CM | POA: Diagnosis not present

## 2023-02-13 DIAGNOSIS — E118 Type 2 diabetes mellitus with unspecified complications: Secondary | ICD-10-CM | POA: Diagnosis not present

## 2023-02-13 DIAGNOSIS — Z7984 Long term (current) use of oral hypoglycemic drugs: Secondary | ICD-10-CM

## 2023-02-13 DIAGNOSIS — I10 Essential (primary) hypertension: Secondary | ICD-10-CM | POA: Diagnosis not present

## 2023-02-13 DIAGNOSIS — C9211 Chronic myeloid leukemia, BCR/ABL-positive, in remission: Secondary | ICD-10-CM

## 2023-02-13 LAB — POCT GLYCOSYLATED HEMOGLOBIN (HGB A1C): Hemoglobin A1C: 5.8 % — AB (ref 4.0–5.6)

## 2023-02-13 MED ORDER — DAPAGLIFLOZIN PROPANEDIOL 10 MG PO TABS
10.0000 mg | ORAL_TABLET | Freq: Every day | ORAL | 1 refills | Status: DC
Start: 2023-02-13 — End: 2023-06-15

## 2023-02-13 NOTE — Assessment & Plan Note (Addendum)
Blood sugars stable without hypoglycemic symptoms or events. Currently being treated with metformin and Januvia. Will d/c [place] and add [place] for renal and CV protection. Lab Results  Component Value Date   HGBA1C 5.6 11/03/2022  A1C 5.8 today

## 2023-02-13 NOTE — Assessment & Plan Note (Signed)
Stable exam with well controlled BP.  Currently taking amlodipine, valsartan, hydrochlorothiazide and atenolol. Tolerating medications without concerns or side effects. Will continue to recommend low sodium diet and current regimen.

## 2023-02-13 NOTE — Progress Notes (Signed)
Date:  02/13/2023   Name:  Jenna Stein   DOB:  24-Nov-1950   MRN:  [id number]   Chief Complaint: Diabetes and Hypertension  Hypertension Pertinent negatives include no chest pain, headaches, palpitations or shortness of breath. Past treatments include calcium channel blockers, beta blockers, diuretics and angiotensin blockers. The current treatment provides significant improvement.  Diabetes She presents for her follow-up diabetic visit. She has type 2 diabetes mellitus. Pertinent negatives for hypoglycemia include no headaches or tremors. Pertinent negatives for diabetes include no chest pain, no fatigue, no polydipsia and no polyuria. Current diabetic treatment includes oral agent (dual therapy) Alma Friendly and metformin).    Lab Results  Component Value Date   NA 131 (L) 11/18/2022   K 3.5 11/18/2022   CO2 28 11/18/2022   GLUCOSE 98 11/18/2022   BUN 14 11/18/2022   CREATININE 1.01 (H) 11/18/2022   CALCIUM 9.1 11/18/2022   EGFR 59 (L) 11/03/2022   GFRNONAA 60 (L) 11/18/2022   Lab Results  Component Value Date   CHOL 110 11/03/2022   HDL 48 11/03/2022   LDLCALC 42 11/03/2022   TRIG 111 11/03/2022   CHOLHDL 2.3 11/03/2022   Lab Results  Component Value Date   TSH 1.020 11/03/2022   Lab Results  Component Value Date   HGBA1C 5.8 (A) 02/13/2023   Lab Results  Component Value Date   WBC 4.6 11/18/2022   HGB 11.0 (L) 11/18/2022   HCT 31.4 (L) 11/18/2022   MCV 94.9 11/18/2022   PLT 176 11/18/2022   Lab Results  Component Value Date   ALT 21 11/18/2022   AST 31 11/18/2022   ALKPHOS 34 (L) 11/18/2022   BILITOT 0.7 11/18/2022   Lab Results  Component Value Date   VD25OH 29.3 (L) 05/07/2015     Review of Systems  Constitutional:  Negative for appetite change, fatigue, fever and unexpected weight change.  HENT:  Negative for tinnitus and trouble swallowing.   Eyes:  Negative for visual disturbance.  Respiratory:  Negative for cough, chest tightness and  shortness of breath.   Cardiovascular:  Negative for chest pain, palpitations and leg swelling.  Gastrointestinal:  Negative for abdominal pain.  Endocrine: Negative for polydipsia and polyuria.  Genitourinary:  Negative for dysuria and hematuria.  Musculoskeletal:  Negative for arthralgias.  Neurological:  Negative for tremors, numbness and headaches.  Psychiatric/Behavioral:  Negative for dysphoric mood.     Patient Active Problem List   Diagnosis Date Noted   Elevated serum creatinine 12/02/2022   Trigger thumb of left hand 11/06/2022   Encounter for antineoplastic chemotherapy 05/06/2022   Periorbital edema of both eyes 05/06/2022   Chemotherapy induced diarrhea 05/06/2022   Anemia due to antineoplastic chemotherapy 05/06/2022   Hypokalemia 05/06/2022   Chemotherapy-induced neutropenia (HCC) 10/24/2021   Gastroesophageal reflux disease with esophagitis 10/24/2021   BCR/ABL1-positive chronic myeloid leukemia (CML) in remission (HCC) 11/17/2020   Abnormal blood smear 10/25/2020   Type II diabetes mellitus with complication (HCC) 02/10/2017   Dry mouth 10/10/2016   Allergic rhinitis, seasonal 04/30/2015   Hepatic steatosis 04/30/2015   Osteoarthritis of both hands 04/30/2015   Genital herpes 04/04/2009   Depression, major, single episode, in partial remission (HCC) 08/18/1998   Essential hypertension 08/18/1998   Hyperlipidemia associated with type 2 diabetes mellitus (HCC) 08/18/1998   Primary insomnia 08/18/1998    Allergies  Allergen Reactions   Clonazepam Other (See Comments)    Nocturnal enuresis; Lethargy    Trazodone Anxiety and Other (  See Comments)    Dizziness     Past Surgical History:  Procedure Laterality Date   CYST EXCISION Left 2017   foot   EYE SURGERY  11/16/1996   myopia laser surgery   HAND SURGERY  07/2012   MASS EXCISION Right 08/13/2021   Procedure: EXCISION LOWER LIP MASS;  Surgeon: Geanie Logan, MD;  Location: [facility];   Service: ENT;  Laterality: Right;  Diabetic    Social History   Tobacco Use   Smoking status: Never   Smokeless tobacco: Never   Tobacco comments:    smoking cessation materials not required  Vaping Use   Vaping Use: Never used  Substance Use Topics   Alcohol use: No   Drug use: No     Medication list has been reviewed and updated.  Current Meds  Medication Sig   acyclovir (ZOVIRAX) 200 MG capsule TAKE 1 CAPSULE (200 MG TOTAL) BY MOUTH FIVE TIMES DAILY   amLODipine (NORVASC) 2.5 MG tablet Take 1 tablet (2.5 mg total) by mouth daily.   atenolol (TENORMIN) 50 MG tablet Take 1 tablet (50 mg total) by mouth daily.   atorvastatin (LIPITOR) 10 MG tablet Take 1 tablet (10 mg total) by mouth at bedtime.   Coenzyme Q10 (CO Q 10) 100 MG CAPS Take 2 capsules by mouth daily.   dapagliflozin propanediol (FARXIGA) 10 MG TABS tablet Take 1 tablet (10 mg total) by mouth daily before breakfast.   estazolam (PROSOM) 2 MG tablet TAKE 1 TABLET BY MOUTH EVERYDAY AT BEDTIME   glucose blood (FREESTYLE LITE) test strip Use to test BS twice daily   hydrochlorothiazide (HYDRODIURIL) 25 MG tablet Take 1 tablet (25 mg total) by mouth daily.   hydrocortisone cream 0.5 % Apply 1 application topically 2 (two) times daily. Apply to periorbital area twice daily.   imatinib (GLEEVEC) 400 MG tablet Take 1 tablet (400 mg total) by mouth daily. Take with meals and large glass of water.Caution:Chemotherapy.   Lancets (FREESTYLE) lancets Use to test BS once daily   loperamide (IMODIUM) 2 MG capsule Take 1 capsule (2 mg total) by mouth See admin instructions. Initial: 4 mg,the 2 mg every 2 hours (4 mg every 4 hours at night) until diarrhea stops.maximum: 16 mg/day   Lutein 20 MG TABS Take 1 tablet by mouth daily.    metFORMIN (GLUCOPHAGE) 1000 MG tablet Take 1 tablet (1,000 mg total) by mouth 2 (two) times daily with a meal.   MULTIPLE VITAMIN PO 1 tablet daily.    olopatadine (PATANOL) 0.1 % ophthalmic solution Apply  to eye.   Omega-3 Fatty Acids (FISH OIL PEARLS PO) Take by mouth.   omeprazole (PRILOSEC) 20 MG capsule Take 1 capsule (20 mg total) by mouth daily.   ondansetron (ZOFRAN) 4 MG tablet Take 1 tablet (4 mg total) by mouth every 8 (eight) hours as needed for nausea or vomiting.   PARoxetine (PAXIL) 20 MG tablet Take 1 tablet (20 mg total) by mouth daily.   potassium chloride (KLOR-CON M) 10 MEQ tablet Take 1 tablet (10 mEq total) by mouth daily.   prednisoLONE acetate (PRED FORTE) 1 % ophthalmic suspension Place 1 drop into the right eye 4 (four) times daily.   SELENIUM PO Take by mouth daily.   triamcinolone cream (KENALOG) 0.5 % APPLY 1 APPLICATION TOPICALLY 3 (THREE) TIMES DAILY. TO RASH ON ABDOMEN   TURMERIC PO Take 800 mg by mouth.   valsartan (DIOVAN) 320 MG tablet Take 1 tablet (320 mg total)  by mouth daily.   Vitamin D, Ergocalciferol, (DRISDOL) 1.25 MG (50000 UNIT) CAPS capsule Take 50,000 Units by mouth every 7 (seven) days.   vitamin E (VITAMIN E) 400 UNIT capsule Take 1 capsule (400 Units total) by mouth daily.   zinc gluconate 50 MG tablet Take 50 mg by mouth daily.   [DISCONTINUED] sitaGLIPtin (JANUVIA) 100 MG tablet Take 1 tablet (100 mg total) by mouth daily.       08/19/2022    3:20 PM 07/08/2022    2:26 PM 03/07/2022    3:44 PM 10/24/2021   10:51 AM  GAD 7 : Generalized Anxiety Score  Nervous, Anxious, on Edge 0 1 1 0  Control/stop worrying 0 0 0 0  Worry too much - different things 0 2 0 0  Trouble relaxing 0 0 0 0  Restless 0 0 0 0  Easily annoyed or irritable 0 0 0 0  Afraid - awful might happen 0 0 0 0  Total GAD 7 Score 0 3 1 0  Anxiety Difficulty Not difficult at all Not difficult at all Not difficult at all Not difficult at all       09/15/2022   10:37 AM 08/19/2022    3:20 PM 07/08/2022    2:26 PM  Depression screen PHQ 2/9  Decreased Interest 0 0 0  Down, Depressed, Hopeless 0 0 0  PHQ - 2 Score 0 0 0  Altered sleeping  0 0  Tired, decreased energy  0 2   Change in appetite  0 2  Feeling bad or failure about yourself   0 0  Trouble concentrating  0 0  Moving slowly or fidgety/restless  0 0  Suicidal thoughts  0 0  PHQ-9 Score  0 4  Difficult doing work/chores  Not difficult at all Not difficult at all    BP Readings from Last 3 Encounters:  02/13/23 122/60  12/02/22 133/62  11/03/22 136/81    Physical Exam Vitals and nursing note reviewed.  Constitutional:      General: She is not in acute distress.    Appearance: Normal appearance. She is well-developed.  HENT:     Head: Normocephalic and atraumatic.  Neck:     Vascular: No carotid bruit.  Cardiovascular:     Rate and Rhythm: Normal rate and regular rhythm.     Pulses: Normal pulses.     Heart sounds: No murmur heard. Pulmonary:     Effort: Pulmonary effort is normal. No respiratory distress.     Breath sounds: No wheezing or rhonchi.  Musculoskeletal:     Cervical back: Normal range of motion.     Right lower leg: No edema.     Left lower leg: No edema.  Lymphadenopathy:     Cervical: No cervical adenopathy.  Skin:    General: Skin is warm and dry.     Findings: No rash.     Comments: Bluish discoloration in a lacy venous pattern of left temple  Neurological:     General: No focal deficit present.     Mental Status: She is alert and oriented to person, place, and time.  Psychiatric:        Mood and Affect: Mood normal.        Behavior: Behavior normal.     Wt Readings from Last 3 Encounters:  02/13/23 138 lb (62.6 kg)  12/02/22 141 lb 11.2 oz (64.3 kg)  11/03/22 138 lb (62.6 kg)    BP 122/60  Pulse 74   Ht 5\' 4"  (1.626 m)   Wt 138 lb (62.6 kg)   SpO2 98%   BMI 23.69 kg/m   Assessment and Plan:  Problem List Items Addressed This Visit     Type II diabetes mellitus with complication (HCC) - Primary (Chronic)    Blood sugars stable without hypoglycemic symptoms or events. Currently being treated with metformin and Januvia. Will d/c [place] and  add [place] for renal and CV protection. Lab Results  Component Value Date   HGBA1C 5.6 11/03/2022        Relevant Medications   dapagliflozin propanediol (FARXIGA) 10 MG TABS tablet   Other Relevant Orders   POCT glycosylated hemoglobin (Hb A1C) (Completed)   Essential hypertension (Chronic)    Stable exam with well controlled BP.  Currently taking amlodipine, valsartan, hydrochlorothiazide and atenolol. Tolerating medications without concerns or side effects. Will continue to recommend low sodium diet and current regimen.       BCR/ABL1-positive chronic myeloid leukemia (CML) in remission (HCC) (Chronic)    Doing very well on Gleevec.  Oncology wants to stop medications but patient wants to continue half dose. Follow up planned in October - may be willing to stop medications at that time.      Other Visit Diagnoses     Need for vaccination for pneumococcus       Relevant Orders   Pneumococcal conjugate vaccine 20-valent (Completed)   Long term current use of oral hypoglycemic drug           Return in about 4 months (around 06/15/2023) for DM, HTN.   Partially dictated using Dragon software, any errors are not intentional.  Reubin Milan, MD [facility] [place], [place]

## 2023-02-13 NOTE — Patient Instructions (Signed)
When the [place] arrives, stop the [place] and start [place].  Continue the metformin.

## 2023-02-13 NOTE — Assessment & Plan Note (Signed)
Doing very well on Gleevec.  Oncology wants to stop medications but patient wants to continue half dose. Follow up planned in October - may be willing to stop medications at that time.

## 2023-03-03 ENCOUNTER — Inpatient Hospital Stay: Payer: Medicare Other | Attending: Oncology

## 2023-03-03 DIAGNOSIS — D6481 Anemia due to antineoplastic chemotherapy: Secondary | ICD-10-CM | POA: Insufficient documentation

## 2023-03-03 DIAGNOSIS — C9211 Chronic myeloid leukemia, BCR/ABL-positive, in remission: Secondary | ICD-10-CM | POA: Insufficient documentation

## 2023-03-03 LAB — CBC WITH DIFFERENTIAL (CANCER CENTER ONLY)
Abs Immature Granulocytes: 0.01 10*3/uL (ref 0.00–0.07)
Basophils Absolute: 0.1 10*3/uL (ref 0.0–0.1)
Basophils Relative: 1 %
Eosinophils Absolute: 0.3 10*3/uL (ref 0.0–0.5)
Eosinophils Relative: 7 %
HCT: 31 % — ABNORMAL LOW (ref 36.0–46.0)
Hemoglobin: 10.6 g/dL — ABNORMAL LOW (ref 12.0–15.0)
Immature Granulocytes: 0 %
Lymphocytes Relative: 40 %
Lymphs Abs: 1.9 10*3/uL (ref 0.7–4.0)
MCH: 32.2 pg (ref 26.0–34.0)
MCHC: 34.2 g/dL (ref 30.0–36.0)
MCV: 94.2 fL (ref 80.0–100.0)
Monocytes Absolute: 0.4 10*3/uL (ref 0.1–1.0)
Monocytes Relative: 8 %
Neutro Abs: 2.2 10*3/uL (ref 1.7–7.7)
Neutrophils Relative %: 44 %
Platelet Count: 215 10*3/uL (ref 150–400)
RBC: 3.29 MIL/uL — ABNORMAL LOW (ref 3.87–5.11)
RDW: 12.9 % (ref 11.5–15.5)
WBC Count: 4.9 10*3/uL (ref 4.0–10.5)
nRBC: 0 % (ref 0.0–0.2)

## 2023-03-03 LAB — CMP (CANCER CENTER ONLY)
ALT: 22 U/L (ref 0–44)
AST: 32 U/L (ref 15–41)
Albumin: 3.8 g/dL (ref 3.5–5.0)
Alkaline Phosphatase: 36 U/L — ABNORMAL LOW (ref 38–126)
Anion gap: 7 (ref 5–15)
BUN: 10 mg/dL (ref 8–23)
CO2: 28 mmol/L (ref 22–32)
Calcium: 8.8 mg/dL — ABNORMAL LOW (ref 8.9–10.3)
Chloride: 99 mmol/L (ref 98–111)
Creatinine: 0.97 mg/dL (ref 0.44–1.00)
GFR, Estimated: >60 mL/min (ref >60)
Glucose, Bld: 102 mg/dL — ABNORMAL HIGH (ref 70–99)
Potassium: 3.5 mmol/L (ref 3.5–5.1)
Sodium: 134 mmol/L — ABNORMAL LOW (ref 135–145)
Total Bilirubin: 0.7 mg/dL (ref 0.3–1.2)
Total Protein: 6.2 g/dL — ABNORMAL LOW (ref 6.5–8.1)

## 2023-03-06 ENCOUNTER — Ambulatory Visit: Payer: Medicare Other | Admitting: Internal Medicine

## 2023-03-07 LAB — BCR-ABL1 FISH
Cells Analyzed: 200
Cells Counted: 200

## 2023-03-08 ENCOUNTER — Other Ambulatory Visit: Payer: Self-pay | Admitting: Internal Medicine

## 2023-03-08 DIAGNOSIS — F5101 Primary insomnia: Secondary | ICD-10-CM

## 2023-03-10 LAB — BCR-ABL1, CML/ALL, PCR, QUANT
E1A2 Transcript: <0.0032 %
Interpretation (BCRAL):: NEGATIVE
b2a2 transcript: <0.0032 %
b3a2 transcript: <0.0032 %

## 2023-03-15 ENCOUNTER — Encounter: Payer: Self-pay | Admitting: Internal Medicine

## 2023-03-17 ENCOUNTER — Encounter: Payer: Self-pay | Admitting: Oncology

## 2023-03-17 ENCOUNTER — Inpatient Hospital Stay: Payer: Medicare Other | Admitting: Oncology

## 2023-03-17 VITALS — BP 133/63 | HR 74 | Temp 96.5°F | Resp 18 | Wt 139.0 lb

## 2023-03-17 DIAGNOSIS — C9211 Chronic myeloid leukemia, BCR/ABL-positive, in remission: Secondary | ICD-10-CM

## 2023-03-17 DIAGNOSIS — K521 Toxic gastroenteritis and colitis: Secondary | ICD-10-CM

## 2023-03-17 DIAGNOSIS — Z5111 Encounter for antineoplastic chemotherapy: Secondary | ICD-10-CM

## 2023-03-17 DIAGNOSIS — T451X5A Adverse effect of antineoplastic and immunosuppressive drugs, initial encounter: Secondary | ICD-10-CM

## 2023-03-17 DIAGNOSIS — D6481 Anemia due to antineoplastic chemotherapy: Secondary | ICD-10-CM | POA: Diagnosis not present

## 2023-03-17 DIAGNOSIS — E876 Hypokalemia: Secondary | ICD-10-CM | POA: Diagnosis not present

## 2023-03-17 MED ORDER — IMATINIB MESYLATE 400 MG PO TABS
400.0000 mg | ORAL_TABLET | Freq: Every day | ORAL | 1 refills | Status: DC
Start: 2023-03-17 — End: 2023-06-30

## 2023-03-17 MED ORDER — POTASSIUM CHLORIDE CRYS ER 10 MEQ PO TBCR: 10.0000 meq | EXTENDED_RELEASE_TABLET | Freq: Every day | ORAL | 1 refills | Status: DC | PRN

## 2023-03-17 MED ORDER — LOPERAMIDE HCL 2 MG PO CAPS: 2.0000 mg | ORAL_CAPSULE | ORAL | 2 refills | Status: AC

## 2023-03-17 NOTE — Assessment & Plan Note (Signed)
Treatment as planned above. 

## 2023-03-17 NOTE — Progress Notes (Signed)
Hematology/Oncology Progress note [phone] [phone]     Chief Complaint: Jenna Stein is a 72 y.o. female with chronic phase CML who is seen for management of CML  ASSESSMENT & PLAN:   BCR/ABL1-positive chronic myeloid leukemia (CML) in remission (HCC) Chronic myelogenous leukemia-low risk disease [Sokal, has 4, ELTS] Labs reviewed and discussed with patient. BCR ABL FISH negative. BCR ABL QT PCR <0.0032%  She is in MR4, options of long term treatment vs  stop Gleevec after Oct 2024 with close monitoring of CML labs discussed with patient.  Continue Gleevac 400 mg daily.  Refilled prescription  Encounter for antineoplastic chemotherapy Treatment as planned above  Chemotherapy induced diarrhea symptoms are stable.  Continue as needed Imodium PRN as instructed, prescriptions were reviewed.     Anemia due to antineoplastic chemotherapy Hemoglobin stable.  Continue monitor  Hypokalemia Recommend  potassium chloride on days she has loose BM.     Orders Placed This Encounter  Procedures   CBC with Differential (Cancer Center Only)    Standing Status:   Future    Standing Expiration Date:   03/16/2024   CMP (Cancer Center only)    Standing Status:   Future    Standing Expiration Date:   03/16/2024   BCR-ABL1 FISH    Standing Status:   Future    Standing Expiration Date:   03/16/2024   BCR-ABL1, CML/ALL, PCR, QUANT    Standing Status:   Future    Standing Expiration Date:   03/16/2024   Follow-up in 3 months. All questions were answered. The patient knows to call the clinic with any problems, questions or concerns.  Rickard Patience, MD, PhD [facility] Hematology Oncology 03/17/2023    .  PERTINENT ONCOLOGY HISTORY Jenna Stein is a 72 y.o.afemale who has above oncology history reviewed by me today presented for follow up visit for management of CML Patient previously followed up by Dr.Corcoran, patient switched care to me on  01/03/2021 Extensive medical record review was performed by me  10/09/2020 abnormal peripheral smear (3% myelocytes) 10/25/2020  lab work-up revealed a hematocrit of 40.8, hemoglobin 13.7, platelets 352,000, WBC 10,200. LDH was 174.  BCR-ABL showed 70% of nuclei positive for BCR/ABL1 fusion.  Flow cytometry revealed left shifted aberrant neutrophils, aberrant monocytes and absolute basophilia.   BCR-ABL1 revealed 123.6615% b2a2, < 0.0032% b3a2, and < 0.0032% E1A2 transcript. The ABL1 e13a2 (b2a2, p210) fusion transcript was positive.   11/05/2020 bone marrow biopsy showed  slightly hypercellular marrow with mild granulocytic proliferation and atypical megakaryocytes. The combined bone marrow and peripheral blood findings were very limited but were generally c/w early involvement by chronic myeloid leukemia, chronic phase especially in the presence of previously documented positivity for BCR/ABL1 fusion in peripheral blood analysis.  Cytogenetics revealed 59, XX, t(9;22)(q34.1; q11.2)[19] / 67, XX [1].  Molecular genetics showed a BCR-ABL1 translocation t(9;22) with 65.5684 % BCR-ABL1/ABL1 (IS), major breakpoint p210, log reduction 0.001.  11/16/2020 abdominal ultrasound showed no splenomegaly (7.2 x 9.5 x 4.4 cm).   Relative risk (RR) Sokal 0.81 (intermediate) and Hasford 684.83 (low) based on age (84), spleen (0), platelets (309), basophils (6%), eosinophils (3%), and myeloblasts (occ-1%). ELTS risk score 1.5596 (low risk) based on 0% blasts and 1.6648 (intermediate-risk) based on 1% blasts.  11/28/2020, started on Gleevec 400 mg daily.  INTERVAL HISTORY Jenna Stein is a 72 y.o. female who has above history reviewed by me today presents for follow up visit for management of CML Problems  and complaints are listed below:  Patient has been on Gleevec 400 mg daily.   Overall she tolerates well with manageable toxicities.  +Intermittent diarrhea, she uses Imodium as needed, requests refills.  Noticed  left forehead bruising area. She took a picture and showed me.  She was accompanied by her husband.   Past Medical History:  Diagnosis Date   Allergy    Anemia due to antineoplastic chemotherapy 05/06/2022   Anxiety    Arthritis 2010   Chronic myeloid leukemia, BCR/ABL-positive, not having achieved remission (HCC) 11/17/2020   Depression    Diabetes mellitus without complication (HCC)    GERD (gastroesophageal reflux disease)    Hyperlipidemia    Hypertension     Past Surgical History:  Procedure Laterality Date   CYST EXCISION Left 2017   foot   EYE SURGERY  11/16/1996   myopia laser surgery   HAND SURGERY  07/2012   MASS EXCISION Right 08/13/2021   Procedure: EXCISION LOWER LIP MASS;  Surgeon: Geanie Logan, MD;  Location: [facility];  Service: ENT;  Laterality: Right;  Diabetic    Family History  Problem Relation Age of Onset   Hypertension Mother    Hyperlipidemia Mother    Arthritis Mother    Parkinsonism Father    Arthritis Other    Hypertension Other    Hyperlipidemia Other    Hypothyroidism Other    Breast cancer Sister 5   Cancer Sister     Social History:  reports that she has never smoked. She has never used smokeless tobacco. She reports that she does not drink alcohol and does not use drugs.   Allergies:  Allergies  Allergen Reactions   Clonazepam Other (See Comments)    Nocturnal enuresis; Lethargy    Trazodone Anxiety and Other (See Comments)    Dizziness     Current Medications: Current Outpatient Medications  Medication Sig Dispense Refill   acyclovir (ZOVIRAX) 200 MG capsule TAKE 1 CAPSULE (200 MG TOTAL) BY MOUTH FIVE TIMES DAILY 90 capsule 0   amLODipine (NORVASC) 2.5 MG tablet Take 1 tablet (2.5 mg total) by mouth daily. 90 tablet 1   atenolol (TENORMIN) 50 MG tablet Take 1 tablet (50 mg total) by mouth daily. 90 tablet 1   atorvastatin (LIPITOR) 10 MG tablet Take 1 tablet (10 mg total) by mouth at bedtime. 90 tablet 1    Coenzyme Q10 (CO Q 10) 100 MG CAPS Take 2 capsules by mouth daily.     estazolam (PROSOM) 2 MG tablet TAKE 1 TABLET BY MOUTH EVERYDAY AT BEDTIME 30 tablet 5   glucose blood (FREESTYLE LITE) test strip Use to test BS twice daily 100 each 4   hydrochlorothiazide (HYDRODIURIL) 25 MG tablet Take 1 tablet (25 mg total) by mouth daily. 90 tablet 1   hydrocortisone cream 0.5 % Apply 1 application topically 2 (two) times daily. Apply to periorbital area twice daily. 28 g 1   Lancets (FREESTYLE) lancets Use to test BS once daily 100 each 12   Lutein 20 MG TABS Take 1 tablet by mouth daily.      metFORMIN (GLUCOPHAGE) 1000 MG tablet Take 1 tablet (1,000 mg total) by mouth 2 (two) times daily with a meal. 180 tablet 1   MULTIPLE VITAMIN PO 1 tablet daily.      olopatadine (PATANOL) 0.1 % ophthalmic solution Apply to eye.     Omega-3 Fatty Acids (FISH OIL PEARLS PO) Take by mouth.     omeprazole (PRILOSEC) 20  MG capsule Take 1 capsule (20 mg total) by mouth daily. 90 capsule 1   ondansetron (ZOFRAN) 4 MG tablet Take 1 tablet (4 mg total) by mouth every 8 (eight) hours as needed for nausea or vomiting. 20 tablet 0   PARoxetine (PAXIL) 20 MG tablet Take 1 tablet (20 mg total) by mouth daily. 90 tablet 1   prednisoLONE acetate (PRED FORTE) 1 % ophthalmic suspension Place 1 drop into the right eye 4 (four) times daily.     SELENIUM PO Take by mouth daily.     triamcinolone cream (KENALOG) 0.5 % APPLY 1 APPLICATION TOPICALLY 3 (THREE) TIMES DAILY. TO RASH ON ABDOMEN 30 g 0   TURMERIC PO Take 800 mg by mouth.     valsartan (DIOVAN) 320 MG tablet Take 1 tablet (320 mg total) by mouth daily. 90 tablet 1   Vitamin D, Ergocalciferol, (DRISDOL) 1.25 MG (50000 UNIT) CAPS capsule Take 50,000 Units by mouth every 7 (seven) days.     vitamin E (VITAMIN E) 400 UNIT capsule Take 1 capsule (400 Units total) by mouth daily. 1 capsule 0   zinc gluconate 50 MG tablet Take 50 mg by mouth daily.     dapagliflozin propanediol  (FARXIGA) 10 MG TABS tablet Take 1 tablet (10 mg total) by mouth daily before breakfast. (Patient not taking: Reported on 03/17/2023) 90 tablet 1   imatinib (GLEEVEC) 400 MG tablet Take 1 tablet (400 mg total) by mouth daily. Take with meals and large glass of water.Caution:Chemotherapy. 90 tablet 1   loperamide (IMODIUM) 2 MG capsule Take 1 capsule (2 mg total) by mouth See admin instructions. Initial: 4 mg,the 2 mg every 2 hours (4 mg every 4 hours at night) until diarrhea stops.maximum: 16 mg/day 60 capsule 2   potassium chloride (KLOR-CON M) 10 MEQ tablet Take 1 tablet (10 mEq total) by mouth daily as needed (take on days you have diarrhea.). 90 tablet 1   No current facility-administered medications for this visit.    Review of Systems  Constitutional:  Negative for chills, fever, malaise/fatigue and weight loss.  HENT:  Negative for sore throat.   Eyes:  Negative for redness.       Periorbital edema, occasional subconjunctival hemorrhage and bulbar conjunctivae  Respiratory:  Negative for cough, shortness of breath and wheezing.   Cardiovascular:  Negative for chest pain, palpitations and leg swelling.  Gastrointestinal:  Positive for diarrhea. Negative for abdominal pain, blood in stool, nausea and vomiting.  Genitourinary:  Negative for dysuria.  Musculoskeletal:  Positive for joint pain.  Skin:  Negative for rash.  Neurological:  Negative for dizziness, tingling and tremors.  Endo/Heme/Allergies:  Does not bruise/bleed easily.  Psychiatric/Behavioral:  Negative for hallucinations.    Performance status (ECOG): 0 Vitals Blood pressure 133/63, pulse 74, temperature (!) 96.5 F (35.8 C), temperature source Tympanic, resp. rate 18, weight 139 lb (63 kg), SpO2 99%.  Physical Exam Constitutional:      General: She is not in acute distress.    Appearance: She is not diaphoretic.  HENT:     Head: Normocephalic and atraumatic.     Mouth/Throat:     Comments: Angular cheilitis Eyes:      General: No scleral icterus.    Pupils: Pupils are equal, round, and reactive to light.  Cardiovascular:     Rate and Rhythm: Normal rate and regular rhythm.  Pulmonary:     Effort: Pulmonary effort is normal. No respiratory distress.     Breath sounds:  No wheezing.  Abdominal:     General: There is no distension.     Palpations: Abdomen is soft.     Tenderness: There is no abdominal tenderness.  Musculoskeletal:        General: Normal range of motion.     Cervical back: Normal range of motion and neck supple.  Skin:    General: Skin is warm and dry.     Findings: No erythema.  Neurological:     Mental Status: She is alert and oriented to person, place, and time.     Cranial Nerves: No cranial nerve deficit.     Motor: No abnormal muscle tone.     Coordination: Coordination normal.  Psychiatric:        Mood and Affect: Mood and affect normal.

## 2023-03-17 NOTE — Assessment & Plan Note (Signed)
symptoms are stable.  Continue as needed Imodium PRN as instructed, prescriptions were reviewed.    

## 2023-03-17 NOTE — Assessment & Plan Note (Signed)
Chronic myelogenous leukemia-low risk disease [Sokal, has 4, ELTS] Labs reviewed and discussed with patient. BCR ABL FISH negative. BCR ABL QT PCR <0.0032%  She is in MR4, options of long term treatment vs  stop Gleevec after Oct 2024 with close monitoring of CML labs discussed with patient.  Continue Gleevac 400 mg daily.  Refilled prescription

## 2023-03-17 NOTE — Assessment & Plan Note (Signed)
Hemoglobin stable.  Continue monitor. 

## 2023-03-17 NOTE — Assessment & Plan Note (Signed)
Recommend  potassium chloride on days she has loose BM.

## 2023-04-09 ENCOUNTER — Encounter: Payer: Self-pay | Admitting: Internal Medicine

## 2023-04-09 ENCOUNTER — Other Ambulatory Visit: Payer: Self-pay | Admitting: Internal Medicine

## 2023-04-09 DIAGNOSIS — A6000 Herpesviral infection of urogenital system, unspecified: Secondary | ICD-10-CM

## 2023-04-09 DIAGNOSIS — K219 Gastro-esophageal reflux disease without esophagitis: Secondary | ICD-10-CM

## 2023-04-09 DIAGNOSIS — I10 Essential (primary) hypertension: Secondary | ICD-10-CM

## 2023-04-09 DIAGNOSIS — E1169 Type 2 diabetes mellitus with other specified complication: Secondary | ICD-10-CM

## 2023-04-09 DIAGNOSIS — E118 Type 2 diabetes mellitus with unspecified complications: Secondary | ICD-10-CM

## 2023-04-09 DIAGNOSIS — F324 Major depressive disorder, single episode, in partial remission: Secondary | ICD-10-CM

## 2023-04-09 MED ORDER — AMLODIPINE BESYLATE 2.5 MG PO TABS
2.5000 mg | ORAL_TABLET | Freq: Every day | ORAL | 1 refills | Status: DC
Start: 2023-04-09 — End: 2023-04-10

## 2023-04-09 MED ORDER — OMEPRAZOLE 20 MG PO CPDR
20.0000 mg | DELAYED_RELEASE_CAPSULE | Freq: Every day | ORAL | 1 refills | Status: DC
Start: 2023-04-09 — End: 2023-04-10

## 2023-04-09 MED ORDER — ACYCLOVIR 200 MG PO CAPS
200.0000 mg | ORAL_CAPSULE | Freq: Every day | ORAL | 1 refills | Status: DC
Start: 2023-04-09 — End: 2023-04-10

## 2023-04-09 MED ORDER — ATORVASTATIN CALCIUM 10 MG PO TABS
10.0000 mg | ORAL_TABLET | Freq: Every day | ORAL | 1 refills | Status: DC
Start: 2023-04-09 — End: 2023-04-10

## 2023-04-09 MED ORDER — VALSARTAN 320 MG PO TABS
320.0000 mg | ORAL_TABLET | Freq: Every day | ORAL | 1 refills | Status: DC
Start: 2023-04-09 — End: 2023-04-10

## 2023-04-09 MED ORDER — METFORMIN HCL 1000 MG PO TABS
1000.0000 mg | ORAL_TABLET | Freq: Two times a day (BID) | ORAL | 1 refills | Status: DC
Start: 2023-04-09 — End: 2023-04-10

## 2023-04-09 MED ORDER — ATENOLOL 50 MG PO TABS
50.0000 mg | ORAL_TABLET | Freq: Every day | ORAL | 1 refills | Status: DC
Start: 2023-04-09 — End: 2023-04-10

## 2023-04-09 MED ORDER — PAROXETINE HCL 20 MG PO TABS
20.0000 mg | ORAL_TABLET | Freq: Every day | ORAL | 1 refills | Status: DC
Start: 2023-04-09 — End: 2023-04-10

## 2023-04-09 MED ORDER — HYDROCHLOROTHIAZIDE 25 MG PO TABS
25.0000 mg | ORAL_TABLET | Freq: Every day | ORAL | 1 refills | Status: DC
Start: 2023-04-09 — End: 2023-04-10

## 2023-04-10 ENCOUNTER — Other Ambulatory Visit: Payer: Self-pay

## 2023-04-10 DIAGNOSIS — I10 Essential (primary) hypertension: Secondary | ICD-10-CM

## 2023-04-10 DIAGNOSIS — F324 Major depressive disorder, single episode, in partial remission: Secondary | ICD-10-CM

## 2023-04-10 DIAGNOSIS — E785 Hyperlipidemia, unspecified: Secondary | ICD-10-CM

## 2023-04-10 DIAGNOSIS — K219 Gastro-esophageal reflux disease without esophagitis: Secondary | ICD-10-CM

## 2023-04-10 DIAGNOSIS — E118 Type 2 diabetes mellitus with unspecified complications: Secondary | ICD-10-CM

## 2023-04-10 DIAGNOSIS — A6000 Herpesviral infection of urogenital system, unspecified: Secondary | ICD-10-CM

## 2023-04-10 MED ORDER — METFORMIN HCL 1000 MG PO TABS
1000.0000 mg | ORAL_TABLET | Freq: Two times a day (BID) | ORAL | 1 refills | Status: DC
Start: 2023-04-10 — End: 2023-04-15

## 2023-04-10 MED ORDER — HYDROCHLOROTHIAZIDE 25 MG PO TABS
25.0000 mg | ORAL_TABLET | Freq: Every day | ORAL | 1 refills | Status: AC
Start: 2023-04-10 — End: ?

## 2023-04-10 MED ORDER — ACYCLOVIR 200 MG PO CAPS: 200.0000 mg | ORAL_CAPSULE | Freq: Every day | ORAL | 1 refills | Status: DC

## 2023-04-10 MED ORDER — ATENOLOL 50 MG PO TABS
50.0000 mg | ORAL_TABLET | Freq: Every day | ORAL | 1 refills | Status: AC
Start: 2023-04-10 — End: ?

## 2023-04-10 MED ORDER — VALSARTAN 320 MG PO TABS: 320.0000 mg | ORAL_TABLET | Freq: Every day | ORAL | 1 refills | Status: DC

## 2023-04-10 MED ORDER — AMLODIPINE BESYLATE 2.5 MG PO TABS
2.5000 mg | ORAL_TABLET | Freq: Every day | ORAL | 1 refills | Status: DC
Start: 2023-04-10 — End: 2023-04-15

## 2023-04-10 MED ORDER — OMEPRAZOLE 20 MG PO CPDR
20.0000 mg | DELAYED_RELEASE_CAPSULE | Freq: Every day | ORAL | 1 refills | Status: AC
Start: 2023-04-10 — End: ?

## 2023-04-10 MED ORDER — PAROXETINE HCL 20 MG PO TABS
20.0000 mg | ORAL_TABLET | Freq: Every day | ORAL | 1 refills | Status: DC
Start: 2023-04-10 — End: 2023-04-15

## 2023-04-10 MED ORDER — ATORVASTATIN CALCIUM 10 MG PO TABS
10.0000 mg | ORAL_TABLET | Freq: Every day | ORAL | 1 refills | Status: DC
Start: 2023-04-10 — End: 2023-04-15

## 2023-04-15 ENCOUNTER — Other Ambulatory Visit: Payer: Self-pay

## 2023-04-15 ENCOUNTER — Encounter: Payer: Self-pay | Admitting: Internal Medicine

## 2023-04-15 DIAGNOSIS — F324 Major depressive disorder, single episode, in partial remission: Secondary | ICD-10-CM

## 2023-04-15 DIAGNOSIS — K219 Gastro-esophageal reflux disease without esophagitis: Secondary | ICD-10-CM

## 2023-04-15 DIAGNOSIS — I10 Essential (primary) hypertension: Secondary | ICD-10-CM

## 2023-04-15 DIAGNOSIS — E785 Hyperlipidemia, unspecified: Secondary | ICD-10-CM

## 2023-04-15 DIAGNOSIS — A6000 Herpesviral infection of urogenital system, unspecified: Secondary | ICD-10-CM

## 2023-04-15 DIAGNOSIS — E1169 Type 2 diabetes mellitus with other specified complication: Secondary | ICD-10-CM

## 2023-04-15 DIAGNOSIS — E118 Type 2 diabetes mellitus with unspecified complications: Secondary | ICD-10-CM

## 2023-04-15 MED ORDER — HYDROCHLOROTHIAZIDE 25 MG PO TABS: 25.0000 mg | ORAL_TABLET | Freq: Every day | ORAL | 1 refills | Status: DC

## 2023-04-15 MED ORDER — ATENOLOL 50 MG PO TABS
50.0000 mg | ORAL_TABLET | Freq: Every day | ORAL | 1 refills | Status: DC
Start: 2023-04-15 — End: 2023-11-11

## 2023-04-15 MED ORDER — PAROXETINE HCL 20 MG PO TABS: 20.0000 mg | ORAL_TABLET | Freq: Every day | ORAL | 1 refills | Status: DC

## 2023-04-15 MED ORDER — ACYCLOVIR 200 MG PO CAPS
200.0000 mg | ORAL_CAPSULE | Freq: Every day | ORAL | 1 refills | Status: DC
Start: 2023-04-15 — End: 2023-11-11

## 2023-04-15 MED ORDER — AMLODIPINE BESYLATE 2.5 MG PO TABS: 2.5000 mg | ORAL_TABLET | Freq: Every day | ORAL | 1 refills | Status: DC

## 2023-04-15 MED ORDER — METFORMIN HCL 1000 MG PO TABS
1000.0000 mg | ORAL_TABLET | Freq: Two times a day (BID) | ORAL | 1 refills | Status: DC
Start: 2023-04-15 — End: 2023-11-11

## 2023-04-15 MED ORDER — VALSARTAN 320 MG PO TABS
320.0000 mg | ORAL_TABLET | Freq: Every day | ORAL | 1 refills | Status: DC
Start: 2023-04-15 — End: 2023-11-11

## 2023-04-15 MED ORDER — OMEPRAZOLE 20 MG PO CPDR
20.0000 mg | DELAYED_RELEASE_CAPSULE | Freq: Every day | ORAL | 1 refills | Status: DC
Start: 2023-04-15 — End: 2023-11-11

## 2023-04-15 MED ORDER — ATORVASTATIN CALCIUM 10 MG PO TABS
10.0000 mg | ORAL_TABLET | Freq: Every day | ORAL | 1 refills | Status: DC
Start: 2023-04-15 — End: 2023-11-11

## 2023-06-15 ENCOUNTER — Encounter: Payer: Self-pay | Admitting: Internal Medicine

## 2023-06-15 ENCOUNTER — Ambulatory Visit (INDEPENDENT_AMBULATORY_CARE_PROVIDER_SITE_OTHER): Payer: Medicare Other | Admitting: Internal Medicine

## 2023-06-15 VITALS — BP 124/70 | HR 77 | Ht 64.0 in | Wt 139.0 lb

## 2023-06-15 DIAGNOSIS — Z7984 Long term (current) use of oral hypoglycemic drugs: Secondary | ICD-10-CM

## 2023-06-15 DIAGNOSIS — I10 Essential (primary) hypertension: Secondary | ICD-10-CM

## 2023-06-15 DIAGNOSIS — E118 Type 2 diabetes mellitus with unspecified complications: Secondary | ICD-10-CM

## 2023-06-15 LAB — POCT GLYCOSYLATED HEMOGLOBIN (HGB A1C): Hemoglobin A1C: 5.7 % — AB (ref 4.0–5.6)

## 2023-06-15 NOTE — Assessment & Plan Note (Addendum)
BS been stable on metformin and Januvia, Farxiga stopped. Home readings 130's. A1C today = 5.7.  Continue current medications.

## 2023-06-15 NOTE — Progress Notes (Signed)
Date:  06/15/2023   Name:  Jenna Stein   DOB:  1950/12/25   MRN:  [id number]   Chief Complaint: Diabetes and Hypertension  Diabetes She presents for her follow-up diabetic visit. She has type 2 diabetes mellitus. Her disease course has been stable. Pertinent negatives for hypoglycemia include no headaches or tremors. Pertinent negatives for diabetes include no chest pain, no fatigue, no polydipsia and no polyuria. Pertinent negatives for diabetic complications include no CVA. Current diabetic treatments: Metformin and Farxiga. An ACE inhibitor/angiotensin II receptor blocker is being taken.  Hypertension This is a chronic problem. The problem is controlled. Pertinent negatives include no chest pain, headaches, palpitations or shortness of breath. Past treatments include beta blockers, calcium channel blockers, diuretics and angiotensin blockers. The current treatment provides significant improvement. There is no history of kidney disease, CAD/MI or CVA.    Review of Systems  Constitutional:  Negative for appetite change, fatigue, fever and unexpected weight change.  HENT:  Negative for tinnitus and trouble swallowing.   Eyes:  Negative for visual disturbance.  Respiratory:  Negative for cough, chest tightness and shortness of breath.   Cardiovascular:  Negative for chest pain, palpitations and leg swelling.  Gastrointestinal:  Negative for abdominal pain.  Endocrine: Negative for polydipsia and polyuria.  Genitourinary:  Negative for dysuria and hematuria.  Musculoskeletal:  Negative for arthralgias.  Skin:  Negative for rash.       Various red lesions scattered on hands and legs.  Neurological:  Negative for tremors, numbness and headaches.  Psychiatric/Behavioral:  Negative for dysphoric mood.      Lab Results  Component Value Date   NA 134 (L) 03/03/2023   K 3.5 03/03/2023   CO2 28 03/03/2023   GLUCOSE 102 (H) 03/03/2023   BUN 10 03/03/2023   CREATININE 0.97 03/03/2023    CALCIUM 8.8 (L) 03/03/2023   EGFR 59 (L) 11/03/2022   GFRNONAA >60 03/03/2023   Lab Results  Component Value Date   CHOL 110 11/03/2022   HDL 48 11/03/2022   LDLCALC 42 11/03/2022   TRIG 111 11/03/2022   CHOLHDL 2.3 11/03/2022   Lab Results  Component Value Date   TSH 1.020 11/03/2022   Lab Results  Component Value Date   HGBA1C 5.7 (A) 06/15/2023   Lab Results  Component Value Date   WBC 4.9 03/03/2023   HGB 10.6 (L) 03/03/2023   HCT 31.0 (L) 03/03/2023   MCV 94.2 03/03/2023   PLT 215 03/03/2023   Lab Results  Component Value Date   ALT 22 03/03/2023   AST 32 03/03/2023   ALKPHOS 36 (L) 03/03/2023   BILITOT 0.7 03/03/2023   Lab Results  Component Value Date   VD25OH 29.3 (L) 05/07/2015     Patient Active Problem List   Diagnosis Date Noted   Elevated serum creatinine 12/02/2022   Trigger thumb of left hand 11/06/2022   Encounter for antineoplastic chemotherapy 05/06/2022   Periorbital edema of both eyes 05/06/2022   Chemotherapy induced diarrhea 05/06/2022   Anemia due to antineoplastic chemotherapy 05/06/2022   Hypokalemia 05/06/2022   Chemotherapy-induced neutropenia (HCC) 10/24/2021   Gastroesophageal reflux disease with esophagitis 10/24/2021   BCR/ABL1-positive chronic myeloid leukemia (CML) in remission (HCC) 11/17/2020   Abnormal blood smear 10/25/2020   Type II diabetes mellitus with complication (HCC) 02/10/2017   Dry mouth 10/10/2016   Allergic rhinitis, seasonal 04/30/2015   Hepatic steatosis 04/30/2015   Osteoarthritis of both hands 04/30/2015   Genital herpes 04/04/2009  Depression, major, single episode, in partial remission (HCC) 08/18/1998   Essential hypertension 08/18/1998   Hyperlipidemia associated with type 2 diabetes mellitus (HCC) 08/18/1998   Primary insomnia 08/18/1998    Allergies  Allergen Reactions   Clonazepam Other (See Comments)    Nocturnal enuresis; Lethargy    Trazodone Anxiety and Other (See Comments)     Dizziness     Past Surgical History:  Procedure Laterality Date   CYST EXCISION Left 2017   foot   EYE SURGERY  11/16/1996   myopia laser surgery   HAND SURGERY  07/2012   MASS EXCISION Right 08/13/2021   Procedure: EXCISION LOWER LIP MASS;  Surgeon: Geanie Logan, MD;  Location: [facility];  Service: ENT;  Laterality: Right;  Diabetic    Social History   Tobacco Use   Smoking status: Never   Smokeless tobacco: Never   Tobacco comments:    smoking cessation materials not required  Vaping Use   Vaping status: Never Used  Substance Use Topics   Alcohol use: No   Drug use: No     Medication list has been reviewed and updated.  Current Meds  Medication Sig   acyclovir (ZOVIRAX) 200 MG capsule Take 1 capsule (200 mg total) by mouth 5 (five) times daily.   amLODipine (NORVASC) 2.5 MG tablet Take 1 tablet (2.5 mg total) by mouth daily.   atenolol (TENORMIN) 50 MG tablet Take 1 tablet (50 mg total) by mouth daily.   atorvastatin (LIPITOR) 10 MG tablet Take 1 tablet (10 mg total) by mouth at bedtime.   Coenzyme Q10 (CO Q 10) 100 MG CAPS Take 2 capsules by mouth daily.   estazolam (PROSOM) 2 MG tablet TAKE 1 TABLET BY MOUTH EVERYDAY AT BEDTIME   glucose blood (FREESTYLE LITE) test strip Use to test BS twice daily   hydrochlorothiazide (HYDRODIURIL) 25 MG tablet Take 1 tablet (25 mg total) by mouth daily.   hydrocortisone cream 0.5 % Apply 1 application topically 2 (two) times daily. Apply to periorbital area twice daily.   imatinib (GLEEVEC) 400 MG tablet Take 1 tablet (400 mg total) by mouth daily. Take with meals and large glass of water.Caution:Chemotherapy.   Lancets (FREESTYLE) lancets Use to test BS once daily   loperamide (IMODIUM) 2 MG capsule Take 1 capsule (2 mg total) by mouth See admin instructions. Initial: 4 mg,the 2 mg every 2 hours (4 mg every 4 hours at night) until diarrhea stops.maximum: 16 mg/day   Lutein 20 MG TABS Take 1 tablet by mouth daily.     metFORMIN (GLUCOPHAGE) 1000 MG tablet Take 1 tablet (1,000 mg total) by mouth 2 (two) times daily with a meal.   MULTIPLE VITAMIN PO 1 tablet daily.    olopatadine (PATANOL) 0.1 % ophthalmic solution Apply to eye.   Omega-3 Fatty Acids (FISH OIL PEARLS PO) Take by mouth.   omeprazole (PRILOSEC) 20 MG capsule Take 1 capsule (20 mg total) by mouth daily.   ondansetron (ZOFRAN) 4 MG tablet Take 1 tablet (4 mg total) by mouth every 8 (eight) hours as needed for nausea or vomiting.   PARoxetine (PAXIL) 20 MG tablet Take 1 tablet (20 mg total) by mouth daily.   potassium chloride (KLOR-CON M) 10 MEQ tablet Take 1 tablet (10 mEq total) by mouth daily as needed (take on days you have diarrhea.).   prednisoLONE acetate (PRED FORTE) 1 % ophthalmic suspension Place 1 drop into the right eye 4 (four) times daily.   SELENIUM PO  Take by mouth daily.   sitaGLIPtin (JANUVIA) 100 MG tablet Take 100 mg by mouth daily.   triamcinolone cream (KENALOG) 0.5 % APPLY 1 APPLICATION TOPICALLY 3 (THREE) TIMES DAILY. TO RASH ON ABDOMEN   TURMERIC PO Take 800 mg by mouth.   valsartan (DIOVAN) 320 MG tablet Take 1 tablet (320 mg total) by mouth daily.   Vitamin D, Ergocalciferol, (DRISDOL) 1.25 MG (50000 UNIT) CAPS capsule Take 50,000 Units by mouth every 7 (seven) days.   vitamin E (VITAMIN E) 400 UNIT capsule Take 1 capsule (400 Units total) by mouth daily.   zinc gluconate 50 MG tablet Take 50 mg by mouth daily.       06/15/2023    1:26 PM 08/19/2022    3:20 PM 07/08/2022    2:26 PM 03/07/2022    3:44 PM  GAD 7 : Generalized Anxiety Score  Nervous, Anxious, on Edge 0 0 1 1  Control/stop worrying 0 0 0 0  Worry too much - different things 0 0 2 0  Trouble relaxing 0 0 0 0  Restless 0 0 0 0  Easily annoyed or irritable 0 0 0 0  Afraid - awful might happen 0 0 0 0  Total GAD 7 Score 0 0 3 1  Anxiety Difficulty Not difficult at all Not difficult at all Not difficult at all Not difficult at all       06/15/2023     1:26 PM 09/15/2022   10:37 AM 08/19/2022    3:20 PM  Depression screen PHQ 2/9  Decreased Interest 0 0 0  Down, Depressed, Hopeless 0 0 0  PHQ - 2 Score 0 0 0  Altered sleeping 0  0  Tired, decreased energy 0  0  Change in appetite 0  0  Feeling bad or failure about yourself  0  0  Trouble concentrating 0  0  Moving slowly or fidgety/restless 0  0  Suicidal thoughts 0  0  PHQ-9 Score 0  0  Difficult doing work/chores Not difficult at all  Not difficult at all    BP Readings from Last 3 Encounters:  06/15/23 124/70  03/17/23 133/63  02/13/23 122/60    Physical Exam Vitals and nursing note reviewed.  Constitutional:      General: She is not in acute distress.    Appearance: Normal appearance. She is well-developed.  HENT:     Head: Normocephalic and atraumatic.  Neck:     Vascular: No carotid bruit.  Cardiovascular:     Rate and Rhythm: Normal rate and regular rhythm.     Heart sounds: No murmur heard. Pulmonary:     Effort: Pulmonary effort is normal. No respiratory distress.     Breath sounds: No wheezing or rhonchi.  Musculoskeletal:     Cervical back: Normal range of motion.     Right lower leg: No edema.     Left lower leg: No edema.  Lymphadenopathy:     Cervical: No cervical adenopathy.  Skin:    General: Skin is warm and dry.     Findings: No rash.  Neurological:     General: No focal deficit present.     Mental Status: She is alert and oriented to person, place, and time.  Psychiatric:        Mood and Affect: Mood normal.        Behavior: Behavior normal.     Wt Readings from Last 3 Encounters:  06/15/23 139 lb (63 kg)  03/17/23 139 lb (63 kg)  02/13/23 138 lb (62.6 kg)    BP 124/70   Pulse 77   Ht 5\' 4"  (1.626 m)   Wt 139 lb (63 kg)   SpO2 97%   BMI 23.86 kg/m   Assessment and Plan:  Problem List Items Addressed This Visit       Unprioritized   Essential hypertension - Primary (Chronic)   Type II diabetes mellitus with  complication (HCC) (Chronic)    BS been stable on metformin and Januvia, Farxiga stopped. Home readings 130's. A1C today =      Relevant Medications   sitaGLIPtin (JANUVIA) 100 MG tablet   Other Relevant Orders   POCT glycosylated hemoglobin (Hb A1C) (Completed)   Other Visit Diagnoses     Long term current use of oral hypoglycemic drug           Return in about 4 months (around 10/16/2023) for Medicare annual.    Reubin Milan, MD [facility] [place]

## 2023-06-16 ENCOUNTER — Other Ambulatory Visit: Payer: Medicare Other

## 2023-06-16 ENCOUNTER — Inpatient Hospital Stay: Payer: Medicare Other | Attending: Oncology

## 2023-06-16 DIAGNOSIS — C9211 Chronic myeloid leukemia, BCR/ABL-positive, in remission: Secondary | ICD-10-CM | POA: Insufficient documentation

## 2023-06-16 LAB — CBC WITH DIFFERENTIAL (CANCER CENTER ONLY)
Abs Immature Granulocytes: 0.01 10*3/uL (ref 0.00–0.07)
Basophils Absolute: 0.1 10*3/uL (ref 0.0–0.1)
Basophils Relative: 1 %
Eosinophils Absolute: 0.2 10*3/uL (ref 0.0–0.5)
Eosinophils Relative: 6 %
HCT: 30.6 % — ABNORMAL LOW (ref 36.0–46.0)
Hemoglobin: 10.5 g/dL — ABNORMAL LOW (ref 12.0–15.0)
Immature Granulocytes: 0 %
Lymphocytes Relative: 40 %
Lymphs Abs: 1.5 10*3/uL (ref 0.7–4.0)
MCH: 33.4 pg (ref 26.0–34.0)
MCHC: 34.3 g/dL (ref 30.0–36.0)
MCV: 97.5 fL (ref 80.0–100.0)
Monocytes Absolute: 0.2 10*3/uL (ref 0.1–1.0)
Monocytes Relative: 6 %
Neutro Abs: 1.7 10*3/uL (ref 1.7–7.7)
Neutrophils Relative %: 47 %
Platelet Count: 190 10*3/uL (ref 150–400)
RBC: 3.14 MIL/uL — ABNORMAL LOW (ref 3.87–5.11)
RDW: 13.2 % (ref 11.5–15.5)
WBC Count: 3.7 10*3/uL — ABNORMAL LOW (ref 4.0–10.5)
nRBC: 0 % (ref 0.0–0.2)

## 2023-06-16 LAB — CMP (CANCER CENTER ONLY)
ALT: 21 U/L (ref 0–44)
AST: 30 U/L (ref 15–41)
Albumin: 3.9 g/dL (ref 3.5–5.0)
Alkaline Phosphatase: 35 U/L — ABNORMAL LOW (ref 38–126)
Anion gap: 7 (ref 5–15)
BUN: 14 mg/dL (ref 8–23)
CO2: 28 mmol/L (ref 22–32)
Calcium: 9.1 mg/dL (ref 8.9–10.3)
Chloride: 101 mmol/L (ref 98–111)
Creatinine: 1.01 mg/dL — ABNORMAL HIGH (ref 0.44–1.00)
GFR, Estimated: 60 mL/min — ABNORMAL LOW (ref >60)
Glucose, Bld: 123 mg/dL — ABNORMAL HIGH (ref 70–99)
Potassium: 3.7 mmol/L (ref 3.5–5.1)
Sodium: 136 mmol/L (ref 135–145)
Total Bilirubin: 0.5 mg/dL (ref 0.3–1.2)
Total Protein: 5.9 g/dL — ABNORMAL LOW (ref 6.5–8.1)

## 2023-06-20 LAB — BCR-ABL1 FISH
Cells Analyzed: 200
Cells Counted: 200

## 2023-06-23 LAB — BCR-ABL1, CML/ALL, PCR, QUANT
E1A2 Transcript: <0.0032 %
Interpretation (BCRAL):: NEGATIVE
b2a2 transcript: <0.0032 %
b3a2 transcript: <0.0032 %

## 2023-06-30 ENCOUNTER — Encounter: Payer: Self-pay | Admitting: Oncology

## 2023-06-30 ENCOUNTER — Inpatient Hospital Stay: Payer: Medicare Other | Attending: Oncology | Admitting: Oncology

## 2023-06-30 VITALS — BP 139/82 | HR 93 | Temp 97.8°F | Resp 18 | Wt 140.8 lb

## 2023-06-30 DIAGNOSIS — C9211 Chronic myeloid leukemia, BCR/ABL-positive, in remission: Secondary | ICD-10-CM | POA: Insufficient documentation

## 2023-06-30 DIAGNOSIS — T451X5A Adverse effect of antineoplastic and immunosuppressive drugs, initial encounter: Secondary | ICD-10-CM | POA: Diagnosis not present

## 2023-06-30 DIAGNOSIS — Z803 Family history of malignant neoplasm of breast: Secondary | ICD-10-CM | POA: Insufficient documentation

## 2023-06-30 DIAGNOSIS — D6481 Anemia due to antineoplastic chemotherapy: Secondary | ICD-10-CM | POA: Diagnosis not present

## 2023-06-30 DIAGNOSIS — K521 Toxic gastroenteritis and colitis: Secondary | ICD-10-CM | POA: Diagnosis not present

## 2023-06-30 NOTE — Assessment & Plan Note (Signed)
symptoms are stable.  Continue as needed Imodium PRN as instructed, prescriptions were reviewed.    

## 2023-06-30 NOTE — Progress Notes (Signed)
Pt here for follow up. Pt reports feeling occasional nausea.

## 2023-06-30 NOTE — Progress Notes (Signed)
Hematology/Oncology Progress note [phone] [phone]     Chief Complaint: Jenna Stein is a 72 y.o. female with chronic phase CML who is seen for management of CML  ASSESSMENT & PLAN:   BCR/ABL1-positive chronic myeloid leukemia (CML) in remission (HCC) Chronic myelogenous leukemia-low risk disease [Sokal, has 4, ELTS] Labs reviewed and discussed with patient. BCR ABL FISH negative. BCR ABL QT PCR <0.0032%  She is in MR4, options of long term treatment vs  stop Gleevec after Oct 2024 with close monitoring of CML labs discussed with patient.  She is willing to stop Gleevec and be closely monitored. Stop Gleevec Recommend monitor CML labs monthly for the first year, followed by every 2 months during 2nd year, and every 3 months after that.    Chemotherapy induced diarrhea symptoms are stable.  Continue as needed Imodium PRN as instructed, prescriptions were reviewed.     Anemia due to antineoplastic chemotherapy Hemoglobin stable.  Continue monitor    Orders Placed This Encounter  Procedures   CBC with Differential (Cancer Center Only)    Standing Status:   Future    Standing Expiration Date:   06/29/2024   CMP (Cancer Center only)    Standing Status:   Future    Standing Expiration Date:   06/29/2024   BCR-ABL1, CML/ALL, PCR, QUANT    Standing Status:   Future    Standing Expiration Date:   06/29/2024   BCR-ABL1 FISH    Standing Status:   Future    Standing Expiration Date:   06/29/2024   CBC with Differential (Cancer Center Only)    Standing Status:   Standing    Number of Occurrences:   2    Standing Expiration Date:   06/29/2024   BCR-ABL1 FISH    Standing Status:   Standing    Number of Occurrences:   2    Standing Expiration Date:   06/29/2024   BCR-ABL1, CML/ALL, PCR, QUANT    Standing Status:   Standing    Number of Occurrences:   2    Standing Expiration Date:   06/29/2024   Follow-up in 3 months. All questions were answered.  The patient knows to call the clinic with any problems, questions or concerns.  Rickard Patience, MD, PhD [facility] Hematology Oncology 06/30/2023    .  PERTINENT ONCOLOGY HISTORY Jenna Stein is a 72 y.o.afemale who has above oncology history reviewed by me today presented for follow up visit for management of CML Patient previously followed up by Dr.Corcoran, patient switched care to me on 01/03/2021 Extensive medical record review was performed by me  10/09/2020 abnormal peripheral smear (3% myelocytes) 10/25/2020  lab work-up revealed a hematocrit of 40.8, hemoglobin 13.7, platelets 352,000, WBC 10,200. LDH was 174.  BCR-ABL showed 70% of nuclei positive for BCR/ABL1 fusion.  Flow cytometry revealed left shifted aberrant neutrophils, aberrant monocytes and absolute basophilia.   BCR-ABL1 revealed 123.6615% b2a2, < 0.0032% b3a2, and < 0.0032% E1A2 transcript. The ABL1 e13a2 (b2a2, p210) fusion transcript was positive.   11/05/2020 bone marrow biopsy showed  slightly hypercellular marrow with mild granulocytic proliferation and atypical megakaryocytes. The combined bone marrow and peripheral blood findings were very limited but were generally c/w early involvement by chronic myeloid leukemia, chronic phase especially in the presence of previously documented positivity for BCR/ABL1 fusion in peripheral blood analysis.  Cytogenetics revealed 84, XX, t(9;22)(q34.1; q11.2)[19] / 38, XX [1].  Molecular genetics showed a BCR-ABL1 translocation t(9;22) with 65.5684 %  BCR-ABL1/ABL1 (IS), major breakpoint p210, log reduction 0.001.  11/16/2020 abdominal ultrasound showed no splenomegaly (7.2 x 9.5 x 4.4 cm).   Relative risk (RR) Sokal 0.81 (intermediate) and Hasford 684.83 (low) based on age (26), spleen (0), platelets (309), basophils (6%), eosinophils (3%), and myeloblasts (occ-1%). ELTS risk score 1.5596 (low risk) based on 0% blasts and 1.6648 (intermediate-risk) based on 1% blasts.  11/28/2020, started on  Gleevec 400 mg daily.  INTERVAL HISTORY Jenna Stein is a 73 y.o. female who has above history reviewed by me today presents for follow up visit for management of CML Problems and complaints are listed below:  Patient has been on Gleevec 400 mg daily.   Overall she tolerates well with manageable toxicities.  +Intermittent diarrhea, she uses Imodium as needed, requests refills.  Occasionally nausea She was accompanied by her husband.   Past Medical History:  Diagnosis Date   Allergy    Anemia due to antineoplastic chemotherapy 05/06/2022   Anxiety    Arthritis 2010   Chronic myeloid leukemia, BCR/ABL-positive, not having achieved remission (HCC) 11/17/2020   Depression    Diabetes mellitus without complication (HCC)    GERD (gastroesophageal reflux disease)    Hyperlipidemia    Hypertension     Past Surgical History:  Procedure Laterality Date   CYST EXCISION Left 2017   foot   EYE SURGERY  11/16/1996   myopia laser surgery   HAND SURGERY  07/2012   MASS EXCISION Right 08/13/2021   Procedure: EXCISION LOWER LIP MASS;  Surgeon: Geanie Logan, MD;  Location: [facility];  Service: ENT;  Laterality: Right;  Diabetic    Family History  Problem Relation Age of Onset   Hypertension Mother    Hyperlipidemia Mother    Arthritis Mother    Parkinsonism Father    Arthritis Other    Hypertension Other    Hyperlipidemia Other    Hypothyroidism Other    Breast cancer Sister 67   Cancer Sister     Social History:  reports that she has never smoked. She has never used smokeless tobacco. She reports that she does not drink alcohol and does not use drugs.   Allergies:  Allergies  Allergen Reactions   Clonazepam Other (See Comments)    Nocturnal enuresis; Lethargy    Trazodone Anxiety and Other (See Comments)    Dizziness     Current Medications: Current Outpatient Medications  Medication Sig Dispense Refill   acyclovir (ZOVIRAX) 200 MG capsule Take 1 capsule  (200 mg total) by mouth 5 (five) times daily. 90 capsule 1   amLODipine (NORVASC) 2.5 MG tablet Take 1 tablet (2.5 mg total) by mouth daily. 90 tablet 1   atenolol (TENORMIN) 50 MG tablet Take 1 tablet (50 mg total) by mouth daily. 90 tablet 1   atorvastatin (LIPITOR) 10 MG tablet Take 1 tablet (10 mg total) by mouth at bedtime. 90 tablet 1   Coenzyme Q10 (CO Q 10) 100 MG CAPS Take 2 capsules by mouth daily.     estazolam (PROSOM) 2 MG tablet TAKE 1 TABLET BY MOUTH EVERYDAY AT BEDTIME 30 tablet 5   glucose blood (FREESTYLE LITE) test strip Use to test BS twice daily 100 each 4   hydrochlorothiazide (HYDRODIURIL) 25 MG tablet Take 1 tablet (25 mg total) by mouth daily. 90 tablet 1   hydrocortisone cream 0.5 % Apply 1 application topically 2 (two) times daily. Apply to periorbital area twice daily. 28 g 1   Lancets (FREESTYLE) lancets  Use to test BS once daily 100 each 12   Lutein 20 MG TABS Take 1 tablet by mouth daily.      metFORMIN (GLUCOPHAGE) 1000 MG tablet Take 1 tablet (1,000 mg total) by mouth 2 (two) times daily with a meal. 180 tablet 1   MULTIPLE VITAMIN PO 1 tablet daily.      olopatadine (PATANOL) 0.1 % ophthalmic solution Apply to eye.     Omega-3 Fatty Acids (FISH OIL PEARLS PO) Take by mouth.     omeprazole (PRILOSEC) 20 MG capsule Take 1 capsule (20 mg total) by mouth daily. 90 capsule 1   ondansetron (ZOFRAN) 4 MG tablet Take 1 tablet (4 mg total) by mouth every 8 (eight) hours as needed for nausea or vomiting. 20 tablet 0   PARoxetine (PAXIL) 20 MG tablet Take 1 tablet (20 mg total) by mouth daily. 90 tablet 1   potassium chloride (KLOR-CON M) 10 MEQ tablet Take 1 tablet (10 mEq total) by mouth daily as needed (take on days you have diarrhea.). 90 tablet 1   prednisoLONE acetate (PRED FORTE) 1 % ophthalmic suspension Place 1 drop into the right eye 4 (four) times daily.     SELENIUM PO Take by mouth daily.     sitaGLIPtin (JANUVIA) 100 MG tablet Take 100 mg by mouth daily.      triamcinolone cream (KENALOG) 0.5 % APPLY 1 APPLICATION TOPICALLY 3 (THREE) TIMES DAILY. TO RASH ON ABDOMEN 30 g 0   TURMERIC PO Take 800 mg by mouth.     valsartan (DIOVAN) 320 MG tablet Take 1 tablet (320 mg total) by mouth daily. 90 tablet 1   Vitamin D, Ergocalciferol, (DRISDOL) 1.25 MG (50000 UNIT) CAPS capsule Take 50,000 Units by mouth every 7 (seven) days.     vitamin E (VITAMIN E) 400 UNIT capsule Take 1 capsule (400 Units total) by mouth daily. 1 capsule 0   zinc gluconate 50 MG tablet Take 50 mg by mouth daily.     loperamide (IMODIUM) 2 MG capsule Take 1 capsule (2 mg total) by mouth See admin instructions. Initial: 4 mg,the 2 mg every 2 hours (4 mg every 4 hours at night) until diarrhea stops.maximum: 16 mg/day (Patient not taking: Reported on 06/30/2023) 60 capsule 2   No current facility-administered medications for this visit.    Review of Systems  Constitutional:  Negative for chills, fever, malaise/fatigue and weight loss.  HENT:  Negative for sore throat.   Eyes:  Negative for redness.       Periorbital edema, occasional subconjunctival hemorrhage and bulbar conjunctivae  Respiratory:  Negative for cough, shortness of breath and wheezing.   Cardiovascular:  Negative for chest pain, palpitations and leg swelling.  Gastrointestinal:  Positive for diarrhea. Negative for abdominal pain, blood in stool, nausea and vomiting.  Genitourinary:  Negative for dysuria.  Musculoskeletal:  Positive for joint pain.  Skin:  Negative for rash.  Neurological:  Negative for dizziness, tingling and tremors.  Endo/Heme/Allergies:  Does not bruise/bleed easily.  Psychiatric/Behavioral:  Negative for hallucinations.    Performance status (ECOG): 0 Vitals Blood pressure 139/82, pulse 93, temperature 97.8 F (36.6 C), resp. rate 18, weight 140 lb 12.8 oz (63.9 kg).  Physical Exam Constitutional:      General: She is not in acute distress.    Appearance: She is not diaphoretic.  HENT:      Head: Normocephalic and atraumatic.     Mouth/Throat:     Comments: Angular cheilitis Eyes:  General: No scleral icterus.    Pupils: Pupils are equal, round, and reactive to light.  Cardiovascular:     Rate and Rhythm: Normal rate and regular rhythm.  Pulmonary:     Effort: Pulmonary effort is normal. No respiratory distress.     Breath sounds: No wheezing.  Abdominal:     General: There is no distension.     Palpations: Abdomen is soft.     Tenderness: There is no abdominal tenderness.  Musculoskeletal:        General: Normal range of motion.     Cervical back: Normal range of motion and neck supple.  Skin:    General: Skin is warm and dry.     Findings: No erythema.  Neurological:     Mental Status: She is alert and oriented to person, place, and time.     Cranial Nerves: No cranial nerve deficit.     Motor: No abnormal muscle tone.     Coordination: Coordination normal.  Psychiatric:        Mood and Affect: Mood and affect normal.

## 2023-06-30 NOTE — Assessment & Plan Note (Addendum)
Chronic myelogenous leukemia-low risk disease [Sokal, has 4, ELTS] Labs reviewed and discussed with patient. BCR ABL FISH negative. BCR ABL QT PCR <0.0032%  She is in MR4, options of long term treatment vs  stop Gleevec after Oct 2024 with close monitoring of CML labs discussed with patient.  She is willing to stop Gleevec and be closely monitored. Stop Gleevec Recommend monitor CML labs monthly for the first year, followed by every 2 months during 2nd year, and every 3 months after that.

## 2023-06-30 NOTE — Assessment & Plan Note (Signed)
Hemoglobin stable.  Continue monitor. 

## 2023-07-28 ENCOUNTER — Inpatient Hospital Stay: Payer: Medicare Other

## 2023-07-29 ENCOUNTER — Other Ambulatory Visit: Payer: Self-pay | Admitting: Oncology

## 2023-07-29 ENCOUNTER — Inpatient Hospital Stay: Payer: Medicare Other | Attending: Oncology

## 2023-07-29 DIAGNOSIS — C9211 Chronic myeloid leukemia, BCR/ABL-positive, in remission: Secondary | ICD-10-CM | POA: Insufficient documentation

## 2023-07-29 LAB — CBC WITH DIFFERENTIAL (CANCER CENTER ONLY)
Abs Immature Granulocytes: 0.06 10*3/uL (ref 0.00–0.07)
Basophils Absolute: 0.1 10*3/uL (ref 0.0–0.1)
Basophils Relative: 1 %
Eosinophils Absolute: 0.3 10*3/uL (ref 0.0–0.5)
Eosinophils Relative: 6 %
HCT: 34.2 % — ABNORMAL LOW (ref 36.0–46.0)
Hemoglobin: 11.7 g/dL — ABNORMAL LOW (ref 12.0–15.0)
Immature Granulocytes: 1 %
Lymphocytes Relative: 35 %
Lymphs Abs: 1.6 10*3/uL (ref 0.7–4.0)
MCH: 31.9 pg (ref 26.0–34.0)
MCHC: 34.2 g/dL (ref 30.0–36.0)
MCV: 93.2 fL (ref 80.0–100.0)
Monocytes Absolute: 0.4 10*3/uL (ref 0.1–1.0)
Monocytes Relative: 9 %
Neutro Abs: 2.2 10*3/uL (ref 1.7–7.7)
Neutrophils Relative %: 48 %
Platelet Count: 239 10*3/uL (ref 150–400)
RBC: 3.67 MIL/uL — ABNORMAL LOW (ref 3.87–5.11)
RDW: 12.2 % (ref 11.5–15.5)
WBC Count: 4.6 10*3/uL (ref 4.0–10.5)
nRBC: 0 % (ref 0.0–0.2)

## 2023-07-29 LAB — CMP (CANCER CENTER ONLY)
ALT: 18 U/L (ref 0–44)
AST: 23 U/L (ref 15–41)
Albumin: 4.2 g/dL (ref 3.5–5.0)
Alkaline Phosphatase: 42 U/L (ref 38–126)
Anion gap: 10 (ref 5–15)
BUN: 16 mg/dL (ref 8–23)
CO2: 27 mmol/L (ref 22–32)
Calcium: 9.6 mg/dL (ref 8.9–10.3)
Chloride: 102 mmol/L (ref 98–111)
Creatinine: 0.85 mg/dL (ref 0.44–1.00)
GFR, Estimated: >60 mL/min (ref >60)
Glucose, Bld: 111 mg/dL — ABNORMAL HIGH (ref 70–99)
Potassium: 4.1 mmol/L (ref 3.5–5.1)
Sodium: 139 mmol/L (ref 135–145)
Total Bilirubin: 0.9 mg/dL (ref <1.2)
Total Protein: 6.5 g/dL (ref 6.5–8.1)

## 2023-08-04 LAB — BCR-ABL1, CML/ALL, PCR, QUANT
E1A2 Transcript: <0.0032 %
Interpretation (BCRAL):: NEGATIVE
b2a2 transcript: <0.0032 %
b3a2 transcript: <0.0032 %

## 2023-08-05 LAB — BCR-ABL1 FISH
Cells Analyzed: 200
Cells Counted: 200

## 2023-08-15 ENCOUNTER — Other Ambulatory Visit: Payer: Self-pay | Admitting: Medical Genetics

## 2023-08-17 ENCOUNTER — Other Ambulatory Visit
Admission: RE | Admit: 2023-08-17 | Discharge: 2023-08-17 | Disposition: A | Discharge status: Home / Self Care | Payer: Self-pay | Source: Ambulatory Visit | Attending: Medical Genetics | Admitting: Medical Genetics

## 2023-08-29 LAB — GENECONNECT MOLECULAR SCREEN: Genetic Analysis Overall Interpretation: NEGATIVE

## 2023-09-01 ENCOUNTER — Other Ambulatory Visit: Payer: Self-pay

## 2023-09-01 ENCOUNTER — Inpatient Hospital Stay: Payer: Medicare Other | Attending: Oncology

## 2023-09-01 DIAGNOSIS — C9211 Chronic myeloid leukemia, BCR/ABL-positive, in remission: Secondary | ICD-10-CM

## 2023-09-01 LAB — CBC WITH DIFFERENTIAL (CANCER CENTER ONLY)
Abs Immature Granulocytes: 0.01 10*3/uL (ref 0.00–0.07)
Basophils Absolute: 0.1 10*3/uL (ref 0.0–0.1)
Basophils Relative: 1 %
Eosinophils Absolute: 0.2 10*3/uL (ref 0.0–0.5)
Eosinophils Relative: 5 %
HCT: 35.5 % — ABNORMAL LOW (ref 36.0–46.0)
Hemoglobin: 12 g/dL (ref 12.0–15.0)
Immature Granulocytes: 0 %
Lymphocytes Relative: 36 %
Lymphs Abs: 1.8 10*3/uL (ref 0.7–4.0)
MCH: 30.5 pg (ref 26.0–34.0)
MCHC: 33.8 g/dL (ref 30.0–36.0)
MCV: 90.1 fL (ref 80.0–100.0)
Monocytes Absolute: 0.5 10*3/uL (ref 0.1–1.0)
Monocytes Relative: 10 %
Neutro Abs: 2.4 10*3/uL (ref 1.7–7.7)
Neutrophils Relative %: 48 %
Platelet Count: 255 10*3/uL (ref 150–400)
RBC: 3.94 MIL/uL (ref 3.87–5.11)
RDW: 12.2 % (ref 11.5–15.5)
WBC Count: 5 10*3/uL (ref 4.0–10.5)
nRBC: 0 % (ref 0.0–0.2)

## 2023-09-05 LAB — BCR-ABL1 FISH
Cells Analyzed: 200
Cells Counted: 200

## 2023-09-06 LAB — BCR-ABL1, CML/ALL, PCR, QUANT
E1A2 Transcript: <0.0032 %
Interpretation (BCRAL):: NEGATIVE
b2a2 transcript: <0.0032 %
b3a2 transcript: <0.0032 %

## 2023-09-24 ENCOUNTER — Ambulatory Visit: Payer: Medicare Other

## 2023-09-24 VITALS — Ht 64.0 in | Wt 139.0 lb

## 2023-09-24 DIAGNOSIS — Z0000 Encounter for general adult medical examination without abnormal findings: Secondary | ICD-10-CM

## 2023-09-24 DIAGNOSIS — Z1231 Encounter for screening mammogram for malignant neoplasm of breast: Secondary | ICD-10-CM

## 2023-09-24 NOTE — Progress Notes (Signed)
 Subjective:   Jenna Stein is a 73 y.o. female who presents for Medicare Annual (Subsequent) preventive examination.  This patient declined Interactive audio and acupuncturist. Therefore the visit was completed with audio only.   Visit Complete: Virtual I connected with  Jenna Stein on 09/24/23 by a audio enabled telemedicine application and verified that I am speaking with the correct person using two identifiers.  Patient Location: Home  Provider Location: Home Office  I discussed the limitations of evaluation and management by telemedicine. The patient expressed understanding and agreed to proceed.  Vital Signs: Because this visit was a virtual/telehealth visit, some criteria may be missing or patient reported. Any vitals not documented were not able to be obtained and vitals that have been documented are patient reported.  Patient Medicare AWV questionnaire was completed by the patient on 09/20/23; I have confirmed that all information answered by patient is correct and no changes since this date.  Cardiac Risk Factors include: advanced age (>19men, >65 women);hypertension;dyslipidemia;diabetes mellitus     Objective:    Today's Vitals   09/24/23 0955 09/24/23 0956  Weight: 139 lb (63 kg)   Height: 5' 4 (1.626 m)   PainSc:  5    Body mass index is 23.86 kg/m.     09/24/2023   10:11 AM 12/02/2022    1:18 PM 09/15/2022   10:50 AM 08/19/2022    1:59 PM 05/06/2022   12:55 PM 01/20/2022    1:13 PM 10/09/2021    2:32 PM  Advanced Directives  Does Patient Have a Medical Advance Directive? Yes Yes Yes Yes Yes Yes Yes  Type of Estate Agent of [place];Living will  Healthcare Power of [place];Living will      Does patient want to make changes to medical advance directive? No - Patient declined  No - Patient declined      Copy of Healthcare Power of Attorney in Chart? Yes - validated most recent copy scanned in chart (See row information)  Yes -  validated most recent copy scanned in chart (See row information)        Current Medications (verified) Outpatient Encounter Medications as of 09/24/2023  Medication Sig   acyclovir  (ZOVIRAX ) 200 MG capsule Take 1 capsule (200 mg total) by mouth 5 (five) times daily.   amLODipine  (NORVASC ) 2.5 MG tablet Take 1 tablet (2.5 mg total) by mouth daily.   atenolol  (TENORMIN ) 50 MG tablet Take 1 tablet (50 mg total) by mouth daily.   atorvastatin  (LIPITOR) 10 MG tablet Take 1 tablet (10 mg total) by mouth at bedtime.   Coenzyme Q10 (CO Q 10) 100 MG CAPS Take 2 capsules by mouth daily.   estazolam  (PROSOM ) 2 MG tablet TAKE 1 TABLET BY MOUTH EVERYDAY AT BEDTIME   glucose blood (FREESTYLE LITE) test strip Use to test BS twice daily   hydrochlorothiazide  (HYDRODIURIL ) 25 MG tablet Take 1 tablet (25 mg total) by mouth daily.   hydrocortisone  cream 0.5 % Apply 1 application topically 2 (two) times daily. Apply to periorbital area twice daily.   Lancets (FREESTYLE) lancets Use to test BS once daily   loperamide  (IMODIUM ) 2 MG capsule Take 1 capsule (2 mg total) by mouth See admin instructions. Initial: 4 mg,the 2 mg every 2 hours (4 mg every 4 hours at night) until diarrhea stops.maximum: 16 mg/day   Lutein 20 MG TABS Take 1 tablet by mouth daily.    metFORMIN  (GLUCOPHAGE ) 1000 MG tablet Take 1 tablet (1,000 mg  total) by mouth 2 (two) times daily with a meal.   MULTIPLE VITAMIN PO 1 tablet daily.    olopatadine (PATANOL) 0.1 % ophthalmic solution Apply to eye.   Omega-3 Fatty Acids (FISH OIL PEARLS PO) Take by mouth.   omeprazole  (PRILOSEC) 20 MG capsule Take 1 capsule (20 mg total) by mouth daily.   ondansetron  (ZOFRAN ) 4 MG tablet Take 1 tablet (4 mg total) by mouth every 8 (eight) hours as needed for nausea or vomiting.   PARoxetine  (PAXIL ) 20 MG tablet Take 1 tablet (20 mg total) by mouth daily.   potassium chloride  (KLOR-CON  M) 10 MEQ tablet Take 1 tablet (10 mEq total) by mouth daily as needed (take  on days you have diarrhea.).   PREVIDENT 5000 PLUS 1.1 % CREA dental cream Place 1 Application onto teeth as directed.   SELENIUM PO Take by mouth daily.   sitaGLIPtin  (JANUVIA ) 100 MG tablet Take 100 mg by mouth daily.   triamcinolone  cream (KENALOG ) 0.5 % APPLY 1 APPLICATION TOPICALLY 3 (THREE) TIMES DAILY. TO RASH ON ABDOMEN   valsartan  (DIOVAN ) 320 MG tablet Take 1 tablet (320 mg total) by mouth daily.   Vitamin D , Ergocalciferol , (DRISDOL) 1.25 MG (50000 UNIT) CAPS capsule Take 50,000 Units by mouth every 7 (seven) days.   vitamin E  (VITAMIN E ) 400 UNIT capsule Take 1 capsule (400 Units total) by mouth daily.   zinc gluconate 50 MG tablet Take 50 mg by mouth daily.   prednisoLONE acetate (PRED FORTE) 1 % ophthalmic suspension Place 1 drop into the right eye 4 (four) times daily. (Patient not taking: Reported on 09/24/2023)   TURMERIC PO Take 800 mg by mouth.   No facility-administered encounter medications on file as of 09/24/2023.    Allergies (verified) Clonazepam and Trazodone   History: Past Medical History:  Diagnosis Date   Allergy    Anemia due to antineoplastic chemotherapy 05/06/2022   Anxiety    Arthritis 2010   Chronic myeloid leukemia, BCR/ABL-positive, not having achieved remission (HCC) 11/17/2020   Depression    Diabetes mellitus without complication (HCC)    GERD (gastroesophageal reflux disease)    Hyperlipidemia    Hypertension    Past Surgical History:  Procedure Laterality Date   CYST EXCISION Left 2017   foot   EYE SURGERY  11/16/1996   myopia laser surgery   HAND SURGERY  07/2012   MASS EXCISION Right 08/13/2021   Procedure: EXCISION LOWER LIP MASS;  Surgeon: Blair Mt, MD;  Location: [facility];  Service: ENT;  Laterality: Right;  Diabetic   Family History  Problem Relation Age of Onset   Hypertension Mother    Hyperlipidemia Mother    Arthritis Mother    Parkinsonism Father    Arthritis Other    Hypertension Other     Hyperlipidemia Other    Hypothyroidism Other    Breast cancer Sister 65   Cancer Sister    Social History   Socioeconomic History   Marital status: Married    Spouse name: Tommy Solorzano   Number of children: 1   Years of education: Not on file   Highest education level: Bachelor's degree (e.g., BA, AB, BS)  Occupational History   Occupation: Retired  Tobacco Use   Smoking status: Never   Smokeless tobacco: Never   Tobacco comments:    smoking cessation materials not required  Vaping Use   Vaping status: Never Used  Substance and Sexual Activity   Alcohol use: No  Drug use: No   Sexual activity: Not Currently    Birth control/protection: None  Other Topics Concern   Not on file  Social History Narrative   Not on file   Social Drivers of Health   Financial Resource Strain: Low Risk  (09/24/2023)   Overall Financial Resource Strain (CARDIA)    Difficulty of Paying Living Expenses: Not hard at all  Food Insecurity: No Food Insecurity (09/24/2023)   Hunger Vital Sign    Worried About Running Out of Food in the Last Year: Never true    Ran Out of Food in the Last Year: Never true  Transportation Needs: No Transportation Needs (09/24/2023)   PRAPARE - Administrator, Civil Service (Medical): No    Lack of Transportation (Non-Medical): No  Physical Activity: Insufficiently Active (09/24/2023)   Exercise Vital Sign    Days of Exercise per Week: 7 days    Minutes of Exercise per Session: 10 min  Stress: No Stress Concern Present (09/24/2023)   [employer] of Occupational Health - Occupational Stress Questionnaire    Feeling of Stress : Not at all  Social Connections: Moderately Isolated (09/24/2023)   Social Connection and Isolation Panel [NHANES]    Frequency of Communication with Friends and Family: More than three times a week    Frequency of Social Gatherings with Friends and Family: Once a week    Attends Religious Services: Never    Database Administrator or  Organizations: No    Attends Engineer, Structural: Never    Marital Status: Married    Tobacco Counseling Counseling given: Not Answered Tobacco comments: smoking cessation materials not required   Clinical Intake:  Pre-visit preparation completed: Yes  Pain : 0-10 Pain Score: 5  Pain Type: Chronic pain Pain Location: Hand Pain Orientation: Left, Right Pain Descriptors / Indicators: Aching     BMI - recorded: 23.86 Nutritional Status: BMI of 19-24  Normal Nutritional Risks: None Diabetes: Yes CBG done?: No Did pt. bring in CBG monitor from home?: No  How often do you need to have someone help you when you read instructions, pamphlets, or other written materials from your doctor or pharmacy?: 1 - Never  Interpreter Needed?: No  Information entered by :: Vina Ned, CMA   Activities of Daily Living    09/24/2023    9:58 AM 09/20/2023    2:53 AM  In your present state of health, do you have any difficulty performing the following activities:  Hearing? 0 0  Vision? 0 0  Difficulty concentrating or making decisions? 0 0  Walking or climbing stairs? 0 0  Dressing or bathing? 0 0  Doing errands, shopping? 0 0  Preparing Food and eating ? N N  Using the Toilet? N N  In the past six months, have you accidently leaked urine? N N  Do you have problems with loss of bowel control? N N  Managing your Medications? N N  Managing your Finances? N N  Housekeeping or managing your Housekeeping? N N    Patient Care Team: Justus Leita DEL, MD as PCP - General (Internal Medicine) [facility] (Ophthalmology) Babara Call, MD as Consulting Physician (Oncology) Blair Mt, MD as Referring Physician (Otolaryngology) Croley, Granville M, PA-C (Gastroenterology)  Indicate any recent Medical Services you may have received from other than [facility] providers in the past year (date may be approximate).     Assessment:   This is a routine wellness examination for Jenna  Stein.  Hearing/Vision screen Hearing Screening - Comments:: Denies hearing loss Vision Screening - Comments:: Gets eye exams yearly, [facility] [place]   Goals Addressed             This Visit's Progress    Patient Stated       Maintain current health      Depression Screen    09/24/2023   10:09 AM 06/15/2023    1:26 PM 09/15/2022   10:37 AM 08/19/2022    3:20 PM 07/08/2022    2:26 PM 03/07/2022    3:42 PM 10/24/2021   10:50 AM  PHQ 2/9 Scores  PHQ - 2 Score 0 0 0 0 0 1 0  PHQ- 9 Score  0  0 4 3 6     Fall Risk    09/24/2023   10:12 AM 09/20/2023    2:53 AM 06/15/2023    1:26 PM 09/15/2022   10:38 AM 08/19/2022    3:21 PM  Fall Risk   Falls in the past year? 0 0 0 0 0  Number falls in past yr: 0  0 0 0  Injury with Fall? 0  0 0 0  Risk for fall due to : No Fall Risks  No Fall Risks No Fall Risks No Fall Risks  Follow up Falls prevention discussed;Falls evaluation completed  Falls evaluation completed Falls evaluation completed Falls evaluation completed    MEDICARE RISK AT HOME: Medicare Risk at Home Any stairs in or around the home?: No If so, are there any without handrails?: No Home free of loose throw rugs in walkways, pet beds, electrical cords, etc?: No Adequate lighting in your home to reduce risk of falls?: Yes Life alert?: Yes Use of a cane, walker or w/c?: No Grab bars in the bathroom?: Yes Shower chair or bench in shower?: Yes Elevated toilet seat or a handicapped toilet?: No  TIMED UP AND GO:  Was the test performed?  No    Cognitive Function:        09/24/2023   10:12 AM 09/15/2022   10:40 AM 03/08/2018    2:05 PM 02/16/2017    2:17 PM  6CIT Screen  What Year? 0 points 0 points 0 points 0 points  What month? 0 points 0 points 0 points 0 points  What time? 0 points 0 points 0 points 0 points  Count back from 20 0 points 0 points 0 points 0 points  Months in reverse 0 points 0 points 2 points 0 points  Repeat phrase 0 points 0 points 0 points  0 points  Total Score 0 points 0 points 2 points 0 points    Immunizations Immunization History  Administered Date(s) Administered   Fluad Quad(high Dose 65+) 05/24/2021   Influenza Split 04/18/2011, 05/24/2012   Influenza, High Dose Seasonal PF 07/03/2017, 04/29/2018, 04/14/2023   Influenza,inj,Quad PF,6+ Mos 05/02/2013, 05/03/2014, 05/09/2015   Influenza,inj,quad, With Preservative 07/18/2016   Influenza-Unspecified 05/03/2018, 05/18/2020, 06/16/2022   PFIZER(Purple Top)SARS-COV-2 Vaccination 09/27/2019, 10/18/2019, 04/18/2020   PNEUMOCOCCAL CONJUGATE-20 02/13/2023   Pneumococcal Conjugate-13 10/10/2016   Pneumococcal Polysaccharide-23 08/07/2017   Td 08/04/2003   Tdap 08/04/2003, 04/18/2011   Zoster Recombinant(Shingrix) 04/29/2018, 07/29/2018   Zoster, Live 01/12/2013    TDAP status: Due, Education has been provided regarding the importance of this vaccine. Advised may receive this vaccine at local pharmacy or Health Dept. Aware to provide a copy of the vaccination record if obtained from local pharmacy or Health Dept. Verbalized acceptance and understanding.  Flu Vaccine status: Up to date  Pneumococcal vaccine status: Up to date  Covid-19 vaccine status: Declined, Education has been provided regarding the importance of this vaccine but patient still declined. Advised may receive this vaccine at local pharmacy or Health Dept.or vaccine clinic. Aware to provide a copy of the vaccination record if obtained from local pharmacy or Health Dept. Verbalized acceptance and understanding.  Qualifies for Shingles Vaccine? Yes   Zostavax completed Yes   Shingrix Completed?: Yes  Screening Tests Health Maintenance  Topic Date Due   COVID-19 Vaccine (4 - 2024-25 season) 04/19/2023   Diabetic kidney evaluation - Urine ACR  11/03/2023   FOOT EXAM  11/03/2023   HEMOGLOBIN A1C  12/14/2023   OPHTHALMOLOGY EXAM  12/15/2023   MAMMOGRAM  12/23/2023   Diabetic kidney evaluation - eGFR  measurement  07/28/2024   Medicare Annual Wellness (AWV)  09/23/2024   Colonoscopy  09/18/2031   Pneumonia Vaccine 58+ Years old  Completed   INFLUENZA VACCINE  Completed   DEXA SCAN  Completed   Hepatitis C Screening  Completed   Zoster Vaccines- Shingrix  Completed   HPV VACCINES  Aged Out   DTaP/Tdap/Td  Discontinued    Health Maintenance  Health Maintenance Due  Topic Date Due   COVID-19 Vaccine (4 - 2024-25 season) 04/19/2023    Colorectal cancer screening: Type of screening: Colonoscopy. Completed 09/17/21. Repeat every 10 years  Mammogram status: Completed 12/23/22. Repeat every year  Bone Density status: Completed 12/06/20. Results reflect: Bone density results: OSTEOPENIA. Repeat every 5 years.  Lung Cancer Screening: (Low Dose CT Chest recommended if Age 61-80 years, 20 pack-year currently smoking OR have quit w/in 15years.) does not qualify.   Lung Cancer Screening Referral: n/a  Additional Screening:  Hepatitis C Screening: does not qualify; Completed 12/26/15  Vision Screening: Recommended annual ophthalmology exams for early detection of glaucoma and other disorders of the eye. Is the patient up to date with their annual eye exam?  Yes  Who is the provider or what is the name of the office in which the patient attends annual eye exams? [facility] [place] If pt is not established with a provider, would they like to be referred to a provider to establish care? No .   Dental Screening: Recommended annual dental exams for proper oral hygiene  Diabetic Foot Exam: Diabetic Foot Exam: Completed 10/24/22  Community Resource Referral / Chronic Care Management: CRR required this visit?  No   CCM required this visit?  No     Plan:     I have personally reviewed and noted the following in the patient's chart:   Medical and social history Use of alcohol, tobacco or illicit drugs  Current medications and supplements including opioid prescriptions. Patient is  not currently taking opioid prescriptions. Functional ability and status Nutritional status Physical activity Advanced directives List of other physicians Hospitalizations, surgeries, and ER visits in previous 12 months Vitals Screenings to include cognitive, depression, and falls Referrals and appointments  In addition, I have reviewed and discussed with patient certain preventive protocols, quality metrics, and best practice recommendations. A written personalized care plan for preventive services as well as general preventive health recommendations were provided to patient.     Vina Ned, CMA   09/24/2023   After Visit Summary: (MyChart) Due to this being a telephonic visit, the after visit summary with patients personalized plan was offered to patient via MyChart   Nurse Notes:  Needs Tdap vaccine Declined  DM & Nutrition education Declined covid vaccine Placed order for a MMG due 12/23/23 Will need DM foot exam at next OV on 11/11/23 (last was 10/24/22)

## 2023-09-24 NOTE — Patient Instructions (Addendum)
 Jenna Stein , Thank you for taking time to come for your Medicare Wellness Visit. I appreciate your ongoing commitment to your health goals. Please review the following plan we discussed and let me know if I can assist you in the future.   Referrals/Orders/Follow-Ups/Clinician Recommendations: Get a tetanus shot at your convenience. I have placed an order for a mammogram due 12/23/23. Call [facility] @ [phone] to schedule at your convenience. You will be due for your routine diabetic eye exam on 12/15/23. Schedule at your convenience.  This is a list of the screening recommended for you and due dates:  Health Maintenance  Topic Date Due   COVID-19 Vaccine (4 - 2024-25 season) 04/19/2023   Yearly kidney health urinalysis for diabetes  11/03/2023   Complete foot exam   11/03/2023   Hemoglobin A1C  12/14/2023   Eye exam for diabetics  12/15/2023   Mammogram  12/23/2023   Yearly kidney function blood test for diabetes  07/28/2024   Medicare Annual Wellness Visit  09/23/2024   DEXA scan (bone density measurement)  12/06/2025   Colon Cancer Screening  09/18/2031   Pneumonia Vaccine  Completed   Flu Shot  Completed   Hepatitis C Screening  Completed   Zoster (Shingles) Vaccine  Completed   HPV Vaccine  Aged Out   DTaP/Tdap/Td vaccine  Discontinued    Advanced directives: (In Chart) A copy of your advanced directives are scanned into your chart should your provider ever need it.  Next Medicare Annual Wellness Visit scheduled for next year: Yes, 10/06/24 @ 10:00 am (video visit)

## 2023-09-29 ENCOUNTER — Inpatient Hospital Stay: Payer: Medicare Other | Attending: Oncology

## 2023-09-29 DIAGNOSIS — C9211 Chronic myeloid leukemia, BCR/ABL-positive, in remission: Secondary | ICD-10-CM | POA: Insufficient documentation

## 2023-09-29 DIAGNOSIS — R600 Localized edema: Secondary | ICD-10-CM | POA: Insufficient documentation

## 2023-09-29 LAB — CBC WITH DIFFERENTIAL/PLATELET
Abs Immature Granulocytes: 0.02 10*3/uL (ref 0.00–0.07)
Basophils Absolute: 0.1 10*3/uL (ref 0.0–0.1)
Basophils Relative: 1 %
Eosinophils Absolute: 0.3 10*3/uL (ref 0.0–0.5)
Eosinophils Relative: 6 %
HCT: 35.5 % — ABNORMAL LOW (ref 36.0–46.0)
Hemoglobin: 11.8 g/dL — ABNORMAL LOW (ref 12.0–15.0)
Immature Granulocytes: 0 %
Lymphocytes Relative: 35 %
Lymphs Abs: 1.7 10*3/uL (ref 0.7–4.0)
MCH: 29.1 pg (ref 26.0–34.0)
MCHC: 33.2 g/dL (ref 30.0–36.0)
MCV: 87.7 fL (ref 80.0–100.0)
Monocytes Absolute: 0.5 10*3/uL (ref 0.1–1.0)
Monocytes Relative: 9 %
Neutro Abs: 2.3 10*3/uL (ref 1.7–7.7)
Neutrophils Relative %: 49 %
Platelets: 242 10*3/uL (ref 150–400)
RBC: 4.05 MIL/uL (ref 3.87–5.11)
RDW: 12.6 % (ref 11.5–15.5)
WBC: 4.9 10*3/uL (ref 4.0–10.5)
nRBC: 0 % (ref 0.0–0.2)

## 2023-09-29 LAB — COMPREHENSIVE METABOLIC PANEL
ALT: 18 U/L (ref 0–44)
AST: 19 U/L (ref 15–41)
Albumin: 4.1 g/dL (ref 3.5–5.0)
Alkaline Phosphatase: 48 U/L (ref 38–126)
Anion gap: 8 (ref 5–15)
BUN: 20 mg/dL (ref 8–23)
CO2: 27 mmol/L (ref 22–32)
Calcium: 9.4 mg/dL (ref 8.9–10.3)
Chloride: 103 mmol/L (ref 98–111)
Creatinine, Ser: 0.87 mg/dL (ref 0.44–1.00)
GFR, Estimated: >60 mL/min (ref >60)
Glucose, Bld: 115 mg/dL — ABNORMAL HIGH (ref 70–99)
Potassium: 3.8 mmol/L (ref 3.5–5.1)
Sodium: 138 mmol/L (ref 135–145)
Total Bilirubin: 0.7 mg/dL (ref 0.0–1.2)
Total Protein: 6.7 g/dL (ref 6.5–8.1)

## 2023-10-02 LAB — BCR-ABL1 FISH
Cells Analyzed: 200
Cells Counted: 200

## 2023-10-06 ENCOUNTER — Inpatient Hospital Stay (HOSPITAL_BASED_OUTPATIENT_CLINIC_OR_DEPARTMENT_OTHER): Payer: Medicare Other | Admitting: Oncology

## 2023-10-06 ENCOUNTER — Encounter: Payer: Self-pay | Admitting: Oncology

## 2023-10-06 VITALS — BP 132/58 | HR 72 | Temp 97.1°F | Resp 18 | Wt 144.5 lb

## 2023-10-06 DIAGNOSIS — R600 Localized edema: Secondary | ICD-10-CM | POA: Diagnosis not present

## 2023-10-06 DIAGNOSIS — T451X5A Adverse effect of antineoplastic and immunosuppressive drugs, initial encounter: Secondary | ICD-10-CM

## 2023-10-06 DIAGNOSIS — C9211 Chronic myeloid leukemia, BCR/ABL-positive, in remission: Secondary | ICD-10-CM | POA: Diagnosis not present

## 2023-10-06 DIAGNOSIS — D6481 Anemia due to antineoplastic chemotherapy: Secondary | ICD-10-CM

## 2023-10-06 NOTE — Progress Notes (Signed)
 Hematology/Oncology Progress note [phone] [phone]     Chief Complaint: Jenna Stein is a 73 y.o.[name] female with chronic phase CML who is seen for management of CML  ASSESSMENT & PLAN:   BCR/ABL1-positive chronic myeloid leukemia (CML) in remission (HCC) Chronic myelogenous leukemia-low risk disease [Sokal, has 4, ELTS] Labs reviewed and discussed with patient. BCR ABL FISH negative. BCR ABL QT PCR <0.0032%  She is in MR4, off Gleevec since Oct 2024  monitor CML labs monthly for the first year, followed by every 2 months during 2nd year, and every 3 months after that.    Anemia due to antineoplastic chemotherapy Hemoglobin has improved.     Orders Placed This Encounter  Procedures   BCR-ABL1 FISH    Standing Status:   Standing    Number of Occurrences:   2    Expiration Date:   10/05/2024   BCR-ABL1, CML/ALL, PCR, QUANT    Standing Status:   Standing    Number of Occurrences:   2    Expiration Date:   10/05/2024   BCR-ABL1, CML/ALL, PCR, QUANT    Standing Status:   Future    Expected Date:   01/03/2024    Expiration Date:   10/05/2024   BCR-ABL1 FISH    Standing Status:   Future    Expected Date:   01/03/2024    Expiration Date:   10/05/2024   CBC with Differential (Cancer Center Only)    Standing Status:   Future    Expected Date:   01/03/2024    Expiration Date:   10/05/2024   CMP (Cancer Center only)    Standing Status:   Future    Expected Date:   01/03/2024    Expiration Date:   10/05/2024   Follow-up in 3 months. All questions were answered. The patient knows to call the clinic with any problems, questions or concerns.  Rickard Patience, MD, PhD [facility] Hematology Oncology 10/06/2023    .  PERTINENT ONCOLOGY HISTORY Jenna Stein is a 73 y.o.afemale who has above oncology history reviewed by me today presented for follow up visit for management of CML Patient previously followed up by Dr.Corcoran, patient switched care to me on  01/03/2021 Extensive medical record review was performed by me  10/09/2020 abnormal peripheral smear (3% myelocytes) 10/25/2020  lab work-up revealed a hematocrit of 40.8, hemoglobin 13.7, platelets 352,000, WBC 10,200. LDH was 174.  BCR-ABL showed 70% of nuclei positive for BCR/ABL1 fusion.  Flow cytometry revealed left shifted aberrant neutrophils, aberrant monocytes and absolute basophilia.   BCR-ABL1 revealed 123.6615% b2a2, < 0.0032% b3a2, and < 0.0032% E1A2 transcript. The ABL1 e13a2 (b2a2, p210) fusion transcript was positive.   11/05/2020 bone marrow biopsy showed  slightly hypercellular marrow with mild granulocytic proliferation and atypical megakaryocytes. The combined bone marrow and peripheral blood findings were very limited but were generally c/w early involvement by chronic myeloid leukemia, chronic phase especially in the presence of previously documented positivity for BCR/ABL1 fusion in peripheral blood analysis.  Cytogenetics revealed 40, XX, t(9;22)(q34.1; q11.2)[19] / 59, XX [1].  Molecular genetics showed a BCR-ABL1 translocation t(9;22) with 65.5684 % BCR-ABL1/ABL1 (IS), major breakpoint p210, log reduction 0.001.  11/16/2020 abdominal ultrasound showed no splenomegaly (7.2 x 9.5 x 4.4 cm).   Relative risk (RR) Sokal 0.81 (intermediate) and Hasford 684.83 (low) based on age (65), spleen (0), platelets (309), basophils (6%), eosinophils (3%), and myeloblasts (occ-1%). ELTS risk score 1.5596 (low risk) based on 0% blasts and  0.9604 (intermediate-risk) based on 1% blasts.  11/28/2020, started on Gleevec 400 mg daily.  INTERVAL HISTORY Jenna Stein is a 73 y.o. female who has above history reviewed by me today presents for follow up visit for management of CML Problems and complaints are listed below:  Patient has been off Gleevec since Nov 2025   Periorbital edema has improved.  Still has intermittent loose BM>  She was accompanied by her husband.   Past Medical History:   Diagnosis Date   Allergy    Anemia due to antineoplastic chemotherapy 05/06/2022   Anxiety    Arthritis 2010   Chronic myeloid leukemia, BCR/ABL-positive, not having achieved remission (HCC) 11/17/2020   Depression    Diabetes mellitus without complication (HCC)    GERD (gastroesophageal reflux disease)    Hyperlipidemia    Hypertension     Past Surgical History:  Procedure Laterality Date   CYST EXCISION Left 2017   foot   EYE SURGERY  11/16/1996   myopia laser surgery   HAND SURGERY  07/2012   MASS EXCISION Right 08/13/2021   Procedure: EXCISION LOWER LIP MASS;  Surgeon: Geanie Logan, MD;  Location: [facility];  Service: ENT;  Laterality: Right;  Diabetic    Family History  Problem Relation Age of Onset   Hypertension Mother    Hyperlipidemia Mother    Arthritis Mother    Parkinsonism Father    Arthritis Other    Hypertension Other    Hyperlipidemia Other    Hypothyroidism Other    Breast cancer Sister 75   Cancer Sister     Social History:  reports that she has never smoked. She has never used smokeless tobacco. She reports that she does not drink alcohol and does not use drugs.   Allergies:  Allergies  Allergen Reactions   Clonazepam Other (See Comments)    Nocturnal enuresis; Lethargy    Trazodone Anxiety and Other (See Comments)    Dizziness     Current Medications: Current Outpatient Medications  Medication Sig Dispense Refill   acyclovir (ZOVIRAX) 200 MG capsule Take 1 capsule (200 mg total) by mouth 5 (five) times daily. 90 capsule 1   amLODipine (NORVASC) 2.5 MG tablet Take 1 tablet (2.5 mg total) by mouth daily. 90 tablet 1   atenolol (TENORMIN) 50 MG tablet Take 1 tablet (50 mg total) by mouth daily. 90 tablet 1   atorvastatin (LIPITOR) 10 MG tablet Take 1 tablet (10 mg total) by mouth at bedtime. 90 tablet 1   Coenzyme Q10 (CO Q 10) 100 MG CAPS Take 2 capsules by mouth daily.     estazolam (PROSOM) 2 MG tablet TAKE 1 TABLET BY MOUTH  EVERYDAY AT BEDTIME 30 tablet 5   glucose blood (FREESTYLE LITE) test strip Use to test BS twice daily 100 each 4   hydrochlorothiazide (HYDRODIURIL) 25 MG tablet Take 1 tablet (25 mg total) by mouth daily. 90 tablet 1   hydrocortisone cream 0.5 % Apply 1 application topically 2 (two) times daily. Apply to periorbital area twice daily. 28 g 1   Lancets (FREESTYLE) lancets Use to test BS once daily 100 each 12   loperamide (IMODIUM) 2 MG capsule Take 1 capsule (2 mg total) by mouth See admin instructions. Initial: 4 mg,the 2 mg every 2 hours (4 mg every 4 hours at night) until diarrhea stops.maximum: 16 mg/day 60 capsule 2   Lutein 20 MG TABS Take 1 tablet by mouth daily.      metFORMIN (  GLUCOPHAGE) 1000 MG tablet Take 1 tablet (1,000 mg total) by mouth 2 (two) times daily with a meal. 180 tablet 1   MULTIPLE VITAMIN PO 1 tablet daily.      olopatadine (PATANOL) 0.1 % ophthalmic solution Apply to eye.     Omega-3 Fatty Acids (FISH OIL PEARLS PO) Take by mouth.     omeprazole (PRILOSEC) 20 MG capsule Take 1 capsule (20 mg total) by mouth daily. 90 capsule 1   ondansetron (ZOFRAN) 4 MG tablet Take 1 tablet (4 mg total) by mouth every 8 (eight) hours as needed for nausea or vomiting. 20 tablet 0   PARoxetine (PAXIL) 20 MG tablet Take 1 tablet (20 mg total) by mouth daily. 90 tablet 1   potassium chloride (KLOR-CON M) 10 MEQ tablet Take 1 tablet (10 mEq total) by mouth daily as needed (take on days you have diarrhea.). 90 tablet 1   prednisoLONE acetate (PRED FORTE) 1 % ophthalmic suspension Place 1 drop into the right eye 4 (four) times daily.     PREVIDENT 5000 PLUS 1.1 % CREA dental cream Place 1 Application onto teeth as directed.     SELENIUM PO Take by mouth daily.     sitaGLIPtin (JANUVIA) 100 MG tablet Take 100 mg by mouth daily.     triamcinolone cream (KENALOG) 0.5 % APPLY 1 APPLICATION TOPICALLY 3 (THREE) TIMES DAILY. TO RASH ON ABDOMEN 30 g 0   valsartan (DIOVAN) 320 MG tablet Take 1  tablet (320 mg total) by mouth daily. 90 tablet 1   Vitamin D, Ergocalciferol, (DRISDOL) 1.25 MG (50000 UNIT) CAPS capsule Take 50,000 Units by mouth every 7 (seven) days.     vitamin E (VITAMIN E) 400 UNIT capsule Take 1 capsule (400 Units total) by mouth daily. 1 capsule 0   zinc gluconate 50 MG tablet Take 50 mg by mouth daily.     No current facility-administered medications for this visit.    Review of Systems  Constitutional:  Negative for chills, fever, malaise/fatigue and weight loss.  HENT:  Negative for sore throat.   Eyes:  Negative for redness.  Respiratory:  Negative for cough, shortness of breath and wheezing.   Cardiovascular:  Negative for chest pain, palpitations and leg swelling.  Gastrointestinal:  Negative for abdominal pain, blood in stool, nausea and vomiting.  Genitourinary:  Negative for dysuria.  Musculoskeletal:  Positive for joint pain.  Skin:  Negative for rash.  Neurological:  Negative for dizziness, tingling and tremors.  Endo/Heme/Allergies:  Does not bruise/bleed easily.  Psychiatric/Behavioral:  Negative for hallucinations.    Performance status (ECOG): 0 Vitals Blood pressure (!) 132/58, pulse 72, temperature (!) 97.1 F (36.2 C), temperature source Tympanic, resp. rate 18, weight 144 lb 8 oz (65.5 kg), SpO2 98%.  Physical Exam Constitutional:      General: She is not in acute distress.    Appearance: She is not diaphoretic.  HENT:     Head: Normocephalic and atraumatic.  Eyes:     General: No scleral icterus. Cardiovascular:     Rate and Rhythm: Normal rate and regular rhythm.  Pulmonary:     Effort: Pulmonary effort is normal. No respiratory distress.     Breath sounds: No wheezing.  Abdominal:     General: There is no distension.     Palpations: Abdomen is soft.     Tenderness: There is no abdominal tenderness.  Musculoskeletal:        General: Normal range of motion.  Cervical back: Normal range of motion and neck supple.  Skin:     Findings: No erythema.  Neurological:     Mental Status: She is alert and oriented to person, place, and time. Mental status is at baseline.     Motor: No abnormal muscle tone.  Psychiatric:        Mood and Affect: Mood and affect normal.

## 2023-10-06 NOTE — Assessment & Plan Note (Signed)
 Hemoglobin has improved.

## 2023-10-06 NOTE — Assessment & Plan Note (Addendum)
 Chronic myelogenous leukemia-low risk disease [Sokal, has 4, ELTS] Labs reviewed and discussed with patient. BCR ABL FISH negative. BCR ABL QT PCR <0.0032%  She is in MR4, off Gleevec since Nov 2024  monitor CML labs monthly for the first year, followed by every 2 months during 2nd year, and every 3 months after that.

## 2023-10-09 LAB — BCR-ABL1, CML/ALL, PCR, QUANT
E1A2 Transcript: <0.0032 %
Interpretation (BCRAL):: NEGATIVE
b2a2 transcript: <0.0032 %
b3a2 transcript: <0.0032 %

## 2023-10-26 ENCOUNTER — Inpatient Hospital Stay: Attending: Oncology

## 2023-10-26 DIAGNOSIS — C9211 Chronic myeloid leukemia, BCR/ABL-positive, in remission: Secondary | ICD-10-CM | POA: Insufficient documentation

## 2023-10-26 DIAGNOSIS — Z79899 Other long term (current) drug therapy: Secondary | ICD-10-CM | POA: Insufficient documentation

## 2023-10-26 DIAGNOSIS — R600 Localized edema: Secondary | ICD-10-CM | POA: Diagnosis not present

## 2023-10-27 ENCOUNTER — Inpatient Hospital Stay

## 2023-10-29 ENCOUNTER — Encounter: Payer: Self-pay | Admitting: Oncology

## 2023-10-29 ENCOUNTER — Telehealth: Payer: Self-pay | Admitting: *Deleted

## 2023-10-29 LAB — BCR-ABL1 FISH
Cells Analyzed: 200
Cells Counted: 200

## 2023-10-29 NOTE — Telephone Encounter (Signed)
 The pat says that the results should be done now and she can' see anything. She feels that it is not good. I told her that most times it takes more than 4 to 6 days . While  I am on the [hne the 1 came p and the other may take 2-3 days. She is ok with  now. I also told her that when we get the results she would gt them at the same time.

## 2023-10-31 LAB — BCR-ABL1, CML/ALL, PCR, QUANT
E1A2 Transcript: <0.0032 %
b2a2 transcript: 0.0429 %
b3a2 transcript: <0.0032 %

## 2023-11-02 ENCOUNTER — Telehealth: Payer: Self-pay

## 2023-11-02 NOTE — Telephone Encounter (Signed)
 Morrie Sheldon can you please arrange patient to see Dr. Cathie Hoops this week (MD only) and notify patient of appointment date and time.

## 2023-11-02 NOTE — Telephone Encounter (Signed)
-----   Message from Rickard Patience sent at 11/01/2023  3:22 PM EDT ----- Please arrange her to see me this week. Thank you . MD only

## 2023-11-04 ENCOUNTER — Encounter: Payer: Self-pay | Admitting: Oncology

## 2023-11-04 ENCOUNTER — Inpatient Hospital Stay (HOSPITAL_BASED_OUTPATIENT_CLINIC_OR_DEPARTMENT_OTHER): Admitting: Oncology

## 2023-11-04 VITALS — BP 137/71 | HR 70 | Temp 97.5°F | Resp 18 | Wt 145.4 lb

## 2023-11-04 DIAGNOSIS — Z79899 Other long term (current) drug therapy: Secondary | ICD-10-CM | POA: Diagnosis not present

## 2023-11-04 DIAGNOSIS — C9211 Chronic myeloid leukemia, BCR/ABL-positive, in remission: Secondary | ICD-10-CM

## 2023-11-04 DIAGNOSIS — R600 Localized edema: Secondary | ICD-10-CM | POA: Diagnosis not present

## 2023-11-04 MED ORDER — BOSUTINIB 400 MG PO TABS: 400.0000 mg | ORAL_TABLET | Freq: Every day | ORAL | Status: DC

## 2023-11-04 NOTE — Progress Notes (Signed)
 Hematology/Oncology Progress note [phone] [phone]     Chief Complaint: Jenna Stein is a 73 y.o.[name] female with chronic phase CML who is seen for management of CML  ASSESSMENT & PLAN:   BCR/ABL1-positive chronic myeloid leukemia (CML) in remission (HCC) Chronic myelogenous leukemia-low risk disease [Sokal, has 4, ELTS] Labs reviewed and discussed with patient. BCR ABL FISH negative. BCR ABL QT PCR b2a2 transcript 0.0429, lost MR4.5 We discussed about restarting TKI Options of resuming Gleevec versus second-generation TKI were reviewed and discussed with patient. Patient is open to second generation TKI.  We reviewed different side effect profiles of TKI's. Shared decision was made to start Bosutinib 400 mg daily.  Check insurance coverage. Rationale potential side effects were reviewed and discussed with patient.   Orders Placed This Encounter  Procedures   CBC with Differential/Platelet    Standing Status:   Future    Expected Date:   11/25/2023    Expiration Date:   11/03/2024   Comprehensive metabolic panel    Standing Status:   Future    Expected Date:   11/25/2023    Expiration Date:   11/03/2024   Follow-up 2 to 3 weeks after starting TKI All questions were answered. The patient knows to call the clinic with any problems, questions or concerns. We spent sufficient time to discuss many aspect of care, questions were answered to patient's satisfaction. A total of 25 minutes was spent on this visit.  With 5 minutes spent reviewing lab results 15 minutes counseling the patient on the diagnosis, chemotherapy treatments, side effects of the treatment, management of symptoms.  Additional 5 minutes was spent on answering patient's questions.   Rickard Patience, MD, PhD [facility] Hematology Oncology 11/04/2023    .  PERTINENT ONCOLOGY HISTORY Jenna Stein is a 73 y.o.afemale who has above oncology history reviewed by me today presented for follow up visit  for management of CML Patient previously followed up by Dr.Corcoran, patient switched care to me on 01/03/2021 Extensive medical record review was performed by me  10/09/2020 abnormal peripheral smear (3% myelocytes) 10/25/2020  lab work-up revealed a hematocrit of 40.8, hemoglobin 13.7, platelets 352,000, WBC 10,200. LDH was 174.  BCR-ABL showed 70% of nuclei positive for BCR/ABL1 fusion.  Flow cytometry revealed left shifted aberrant neutrophils, aberrant monocytes and absolute basophilia.   BCR-ABL1 revealed 123.6615% b2a2, < 0.0032% b3a2, and < 0.0032% E1A2 transcript. The ABL1 e13a2 (b2a2, p210) fusion transcript was positive.   11/05/2020 bone marrow biopsy showed  slightly hypercellular marrow with mild granulocytic proliferation and atypical megakaryocytes. The combined bone marrow and peripheral blood findings were very limited but were generally c/w early involvement by chronic myeloid leukemia, chronic phase especially in the presence of previously documented positivity for BCR/ABL1 fusion in peripheral blood analysis.  Cytogenetics revealed 68, XX, t(9;22)(q34.1; q11.2)[19] / 93, XX [1].  Molecular genetics showed a BCR-ABL1 translocation t(9;22) with 65.5684 % BCR-ABL1/ABL1 (IS), major breakpoint p210, log reduction 0.001.  11/16/2020 abdominal ultrasound showed no splenomegaly (7.2 x 9.5 x 4.4 cm).   Relative risk (RR) Sokal 0.81 (intermediate) and Hasford 684.83 (low) based on age (13), spleen (0), platelets (309), basophils (6%), eosinophils (3%), and myeloblasts (occ-1%). ELTS risk score 1.5596 (low risk) based on 0% blasts and 1.6648 (intermediate-risk) based on 1% blasts.  11/28/2020, started on Gleevec 400 mg daily.  INTERVAL HISTORY Jenna Stein is a 73 y.o. female who has above history reviewed by me today presents for follow up visit for  management of CML Problems and complaints are listed below:  Patient has been off Gleevec since Nov 2025   Periorbital edema has improved.   Patient was brought back to discuss lab results.  Recent BCR-ABL1 RT-PCR showed that she has lost MR 4.5.  Detectable beta-2 A2 transcript 0.0429.   Past Medical History:  Diagnosis Date   Allergy    Anemia due to antineoplastic chemotherapy 05/06/2022   Anxiety    Arthritis 2010   Chronic myeloid leukemia, BCR/ABL-positive, not having achieved remission (HCC) 11/17/2020   Depression    Diabetes mellitus without complication (HCC)    GERD (gastroesophageal reflux disease)    Hyperlipidemia    Hypertension     Past Surgical History:  Procedure Laterality Date   CYST EXCISION Left 2017   foot   EYE SURGERY  11/16/1996   myopia laser surgery   HAND SURGERY  07/2012   MASS EXCISION Right 08/13/2021   Procedure: EXCISION LOWER LIP MASS;  Surgeon: Geanie Logan, MD;  Location: [facility];  Service: ENT;  Laterality: Right;  Diabetic    Family History  Problem Relation Age of Onset   Hypertension Mother    Hyperlipidemia Mother    Arthritis Mother    Parkinsonism Father    Arthritis Other    Hypertension Other    Hyperlipidemia Other    Hypothyroidism Other    Breast cancer Sister 73   Cancer Sister     Social History:  reports that she has never smoked. She has never used smokeless tobacco. She reports that she does not drink alcohol and does not use drugs.   Allergies:  Allergies  Allergen Reactions   Clonazepam Other (See Comments)    Nocturnal enuresis; Lethargy    Trazodone Anxiety and Other (See Comments)    Dizziness     Current Medications: Current Outpatient Medications  Medication Sig Dispense Refill   acyclovir (ZOVIRAX) 200 MG capsule Take 1 capsule (200 mg total) by mouth 5 (five) times daily. 90 capsule 1   amLODipine (NORVASC) 2.5 MG tablet Take 1 tablet (2.5 mg total) by mouth daily. 90 tablet 1   atenolol (TENORMIN) 50 MG tablet Take 1 tablet (50 mg total) by mouth daily. 90 tablet 1   atorvastatin (LIPITOR) 10 MG tablet Take 1 tablet  (10 mg total) by mouth at bedtime. 90 tablet 1   Coenzyme Q10 (CO Q 10) 100 MG CAPS Take 2 capsules by mouth daily.     estazolam (PROSOM) 2 MG tablet TAKE 1 TABLET BY MOUTH EVERYDAY AT BEDTIME 30 tablet 5   glucose blood (FREESTYLE LITE) test strip Use to test BS twice daily 100 each 4   hydrochlorothiazide (HYDRODIURIL) 25 MG tablet Take 1 tablet (25 mg total) by mouth daily. 90 tablet 1   hydrocortisone cream 0.5 % Apply 1 application topically 2 (two) times daily. Apply to periorbital area twice daily. 28 g 1   Lancets (FREESTYLE) lancets Use to test BS once daily 100 each 12   loperamide (IMODIUM) 2 MG capsule Take 1 capsule (2 mg total) by mouth See admin instructions. Initial: 4 mg,the 2 mg every 2 hours (4 mg every 4 hours at night) until diarrhea stops.maximum: 16 mg/day 60 capsule 2   Lutein 20 MG TABS Take 1 tablet by mouth daily.      metFORMIN (GLUCOPHAGE) 1000 MG tablet Take 1 tablet (1,000 mg total) by mouth 2 (two) times daily with a meal. 180 tablet 1   MULTIPLE VITAMIN  PO 1 tablet daily.      olopatadine (PATANOL) 0.1 % ophthalmic solution Apply to eye.     Omega-3 Fatty Acids (FISH OIL PEARLS PO) Take by mouth.     omeprazole (PRILOSEC) 20 MG capsule Take 1 capsule (20 mg total) by mouth daily. 90 capsule 1   ondansetron (ZOFRAN) 4 MG tablet Take 1 tablet (4 mg total) by mouth every 8 (eight) hours as needed for nausea or vomiting. 20 tablet 0   PARoxetine (PAXIL) 20 MG tablet Take 1 tablet (20 mg total) by mouth daily. 90 tablet 1   potassium chloride (KLOR-CON M) 10 MEQ tablet Take 1 tablet (10 mEq total) by mouth daily as needed (take on days you have diarrhea.). 90 tablet 1   prednisoLONE acetate (PRED FORTE) 1 % ophthalmic suspension Place 1 drop into the right eye 4 (four) times daily.     PREVIDENT 5000 PLUS 1.1 % CREA dental cream Place 1 Application onto teeth as directed.     SELENIUM PO Take by mouth daily.     sitaGLIPtin (JANUVIA) 100 MG tablet Take 100 mg by mouth  daily.     triamcinolone cream (KENALOG) 0.5 % APPLY 1 APPLICATION TOPICALLY 3 (THREE) TIMES DAILY. TO RASH ON ABDOMEN 30 g 0   valsartan (DIOVAN) 320 MG tablet Take 1 tablet (320 mg total) by mouth daily. 90 tablet 1   Vitamin D, Ergocalciferol, (DRISDOL) 1.25 MG (50000 UNIT) CAPS capsule Take 50,000 Units by mouth every 7 (seven) days.     vitamin E (VITAMIN E) 400 UNIT capsule Take 1 capsule (400 Units total) by mouth daily. 1 capsule 0   zinc gluconate 50 MG tablet Take 50 mg by mouth daily.     No current facility-administered medications for this visit.    Review of Systems  Constitutional:  Negative for chills, fever, malaise/fatigue and weight loss.  HENT:  Negative for sore throat.   Eyes:  Negative for redness.  Respiratory:  Negative for cough, shortness of breath and wheezing.   Cardiovascular:  Negative for chest pain, palpitations and leg swelling.  Gastrointestinal:  Negative for abdominal pain, blood in stool, nausea and vomiting.  Genitourinary:  Negative for dysuria.  Musculoskeletal:  Positive for joint pain.  Skin:  Negative for rash.  Neurological:  Negative for dizziness, tingling and tremors.  Endo/Heme/Allergies:  Does not bruise/bleed easily.  Psychiatric/Behavioral:  Negative for hallucinations.    Performance status (ECOG): 0 Vitals Blood pressure 137/71, pulse 70, temperature (!) 97.5 F (36.4 C), resp. rate 18, weight 145 lb 6.4 oz (66 kg).  Physical Exam Constitutional:      General: She is not in acute distress.    Appearance: She is not diaphoretic.  HENT:     Head: Normocephalic and atraumatic.  Eyes:     General: No scleral icterus. Cardiovascular:     Rate and Rhythm: Normal rate.  Pulmonary:     Effort: Pulmonary effort is normal. No respiratory distress.  Abdominal:     General: There is no distension.  Musculoskeletal:        General: Normal range of motion.     Cervical back: Normal range of motion and neck supple.  Skin:     Findings: No erythema.  Neurological:     Mental Status: She is alert and oriented to person, place, and time. Mental status is at baseline.     Motor: No abnormal muscle tone.  Psychiatric:        Mood  and Affect: Mood and affect normal.

## 2023-11-04 NOTE — Assessment & Plan Note (Signed)
 Chronic myelogenous leukemia-low risk disease [Sokal, has 4, ELTS] Labs reviewed and discussed with patient. BCR ABL FISH negative. BCR ABL QT PCR b2a2 transcript 0.0429, lost MR4.5 We discussed about restarting TKI Options of resuming Gleevec versus second-generation TKI were reviewed and discussed with patient. Patient is open to second generation TKI.  We reviewed different side effect profiles of TKI's. Shared decision was made to start Bosutinib 400 mg daily.  Check insurance coverage. Rationale potential side effects were reviewed and discussed with patient.

## 2023-11-11 ENCOUNTER — Ambulatory Visit (INDEPENDENT_AMBULATORY_CARE_PROVIDER_SITE_OTHER): Payer: Medicare Other | Admitting: Internal Medicine

## 2023-11-11 ENCOUNTER — Encounter: Payer: Self-pay | Admitting: Internal Medicine

## 2023-11-11 VITALS — BP 124/66 | HR 61 | Ht 64.0 in | Wt 144.5 lb

## 2023-11-11 DIAGNOSIS — A6000 Herpesviral infection of urogenital system, unspecified: Secondary | ICD-10-CM

## 2023-11-11 DIAGNOSIS — C9211 Chronic myeloid leukemia, BCR/ABL-positive, in remission: Secondary | ICD-10-CM | POA: Diagnosis not present

## 2023-11-11 DIAGNOSIS — G5601 Carpal tunnel syndrome, right upper limb: Secondary | ICD-10-CM | POA: Insufficient documentation

## 2023-11-11 DIAGNOSIS — E1169 Type 2 diabetes mellitus with other specified complication: Secondary | ICD-10-CM

## 2023-11-11 DIAGNOSIS — I10 Essential (primary) hypertension: Secondary | ICD-10-CM

## 2023-11-11 DIAGNOSIS — E118 Type 2 diabetes mellitus with unspecified complications: Secondary | ICD-10-CM | POA: Diagnosis not present

## 2023-11-11 DIAGNOSIS — Z7984 Long term (current) use of oral hypoglycemic drugs: Secondary | ICD-10-CM | POA: Diagnosis not present

## 2023-11-11 DIAGNOSIS — K760 Fatty (change of) liver, not elsewhere classified: Secondary | ICD-10-CM | POA: Diagnosis not present

## 2023-11-11 DIAGNOSIS — F324 Major depressive disorder, single episode, in partial remission: Secondary | ICD-10-CM

## 2023-11-11 DIAGNOSIS — E785 Hyperlipidemia, unspecified: Secondary | ICD-10-CM

## 2023-11-11 DIAGNOSIS — E559 Vitamin D deficiency, unspecified: Secondary | ICD-10-CM | POA: Diagnosis not present

## 2023-11-11 DIAGNOSIS — K2100 Gastro-esophageal reflux disease with esophagitis, without bleeding: Secondary | ICD-10-CM | POA: Diagnosis not present

## 2023-11-11 DIAGNOSIS — Z1231 Encounter for screening mammogram for malignant neoplasm of breast: Secondary | ICD-10-CM

## 2023-11-11 MED ORDER — PAROXETINE HCL 20 MG PO TABS: 20.0000 mg | ORAL_TABLET | Freq: Every day | ORAL | 3 refills | Status: DC

## 2023-11-11 MED ORDER — AMLODIPINE BESYLATE 2.5 MG PO TABS: 2.5000 mg | ORAL_TABLET | Freq: Every day | ORAL | 1 refills | Status: DC

## 2023-11-11 MED ORDER — ATENOLOL 50 MG PO TABS
50.0000 mg | ORAL_TABLET | Freq: Every day | ORAL | 1 refills | Status: DC
Start: 2023-11-11 — End: 2024-03-28

## 2023-11-11 MED ORDER — VALSARTAN 320 MG PO TABS: 320.0000 mg | ORAL_TABLET | Freq: Every day | ORAL | 3 refills | Status: DC

## 2023-11-11 MED ORDER — HYDROCHLOROTHIAZIDE 25 MG PO TABS
25.0000 mg | ORAL_TABLET | Freq: Every day | ORAL | 3 refills | Status: DC
Start: 2023-11-11 — End: 2024-05-03

## 2023-11-11 MED ORDER — ATORVASTATIN CALCIUM 10 MG PO TABS: 10.0000 mg | ORAL_TABLET | Freq: Every day | ORAL | 1 refills | Status: DC

## 2023-11-11 MED ORDER — SITAGLIPTIN PHOSPHATE 100 MG PO TABS: 100.0000 mg | ORAL_TABLET | Freq: Every day | ORAL | 3 refills | Status: DC

## 2023-11-11 MED ORDER — METFORMIN HCL 1000 MG PO TABS: 1000.0000 mg | ORAL_TABLET | Freq: Two times a day (BID) | ORAL | 3 refills | Status: DC

## 2023-11-11 MED ORDER — FREESTYLE LITE TEST VI STRP: ORAL_STRIP | 4 refills | Status: DC

## 2023-11-11 MED ORDER — ACYCLOVIR 200 MG PO CAPS: 200.0000 mg | ORAL_CAPSULE | Freq: Every day | ORAL | 1 refills | Status: DC

## 2023-11-11 MED ORDER — ACETAMINOPHEN 500 MG PO TABS: 1000.0000 mg | ORAL_TABLET | Freq: Two times a day (BID) | ORAL | 0 refills | Status: AC

## 2023-11-11 MED ORDER — OMEPRAZOLE 20 MG PO CPDR: 20.0000 mg | DELAYED_RELEASE_CAPSULE | Freq: Every day | ORAL | 3 refills | Status: DC

## 2023-11-11 NOTE — Assessment & Plan Note (Signed)
 Worsening right sided symptoms  Recommend wrist splint while sleeping Tylenol 1000 mg bid

## 2023-11-11 NOTE — Assessment & Plan Note (Signed)
 Most recent hepatic panel was normal.

## 2023-11-11 NOTE — Assessment & Plan Note (Signed)
 Will check levels. Continue daily supplements.

## 2023-11-11 NOTE — Patient Instructions (Addendum)
 Take Tylenol 500 mg 2 tabs twice a day for 3-4 weeks.  Wear a wrist splint while sleeping.  Call [facility] to schedule your mammogram at [phone].

## 2023-11-11 NOTE — Assessment & Plan Note (Signed)
 Blood sugars have been stable.  No recent hypoglycemic events requiring assistance. Currently medications are Januvia and MTF. Lab Results  Component Value Date   HGBA1C 5.7 (A) 06/15/2023   Last visit no changes were made.

## 2023-11-11 NOTE — Assessment & Plan Note (Addendum)
 Blood pressure is well controlled.  Current medications valsartan, amlodipine, atenolol and hydrochlorothiazide. Will continue same regimen along with efforts to limit dietary sodium.

## 2023-11-11 NOTE — Assessment & Plan Note (Signed)
 Clinically stable on Paxil.   No SI or HI on evaluation. Plan to continue same medications for now.

## 2023-11-11 NOTE — Assessment & Plan Note (Addendum)
 Followed closely by Oncology Was in remission on Gleevec which was stopped 6 mo ago.  Now CML recurrent and therapy changed to Bosutinib 400 mg daily

## 2023-11-11 NOTE — Progress Notes (Signed)
 Date:  11/11/2023   Name:  Jenna Stein   DOB:  06/13/1951   MRN:  [id number]   Chief Complaint: Annual Exam and Arm Pain (Patient said she is having right arm pain, hard for her to sleep on the right side, can not grip items, has trouble holding on to tooth brush) Jenna Stein is a 73 y.o. female who presents today for her Complete Annual Exam. She feels fairly well. She reports exercising none. She reports she is sleeping well. Breast complaints none.  Health Maintenance  Topic Date Due   Yearly kidney health urinalysis for diabetes  11/03/2023   COVID-19 Vaccine (4 - 2024-25 season) 11/27/2023*   Hemoglobin A1C  12/14/2023   Eye exam for diabetics  12/15/2023   Mammogram  12/23/2023   Medicare Annual Wellness Visit  09/23/2024   Yearly kidney function blood test for diabetes  09/28/2024   Complete foot exam   11/10/2024   DEXA scan (bone density measurement)  12/06/2025   Colon Cancer Screening  09/18/2031   Pneumonia Vaccine  Completed   Flu Shot  Completed   Hepatitis C Screening  Completed   Zoster (Shingles) Vaccine  Completed   HPV Vaccine  Aged Out   DTaP/Tdap/Td vaccine  Discontinued  *Topic was postponed. The date shown is not the original due date.    Hypertension This is a chronic problem. The problem is controlled. Pertinent negatives include no chest pain, headaches, palpitations or shortness of breath. Past treatments include angiotensin blockers, calcium channel blockers, beta blockers and diuretics.  Hyperlipidemia This is a chronic problem. The problem is controlled. Pertinent negatives include no chest pain, myalgias or shortness of breath. Current antihyperlipidemic treatment includes statins. The current treatment provides significant improvement of lipids.  Diabetes She presents for her follow-up diabetic visit. She has type 2 diabetes mellitus. Pertinent negatives for hypoglycemia include no dizziness, headaches or nervousness/anxiousness. Pertinent  negatives for diabetes include no chest pain, no fatigue and no weakness. Current diabetic treatments: MTF and Januvia.  Depression        Associated symptoms include insomnia.  Associated symptoms include no fatigue, no myalgias and no headaches. Insomnia Primary symptoms: sleep disturbance.   The problem occurs nightly. PMH includes: depression.   Wrist Pain  The pain is present in the right wrist. This is a recurrent problem. The problem occurs constantly. The problem has been unchanged.    Review of Systems  Constitutional:  Negative for fatigue and unexpected weight change.  HENT:  Negative for trouble swallowing.   Eyes:  Negative for visual disturbance.  Respiratory:  Negative for cough, chest tightness, shortness of breath and wheezing.   Cardiovascular:  Negative for chest pain, palpitations and leg swelling.  Gastrointestinal:  Negative for abdominal pain, constipation and diarrhea.  Genitourinary:  Negative for dysuria and hematuria.  Musculoskeletal:  Positive for arthralgias. Negative for myalgias.  Neurological:  Negative for dizziness, weakness, light-headedness and headaches.  Psychiatric/Behavioral:  Positive for depression and sleep disturbance. Negative for dysphoric mood. The patient has insomnia. The patient is not nervous/anxious.      Lab Results  Component Value Date   NA 138 09/29/2023   K 3.8 09/29/2023   CO2 27 09/29/2023   GLUCOSE 115 (H) 09/29/2023   BUN 20 09/29/2023   CREATININE 0.87 09/29/2023   CALCIUM 9.4 09/29/2023   EGFR 59 (L) 11/03/2022   GFRNONAA >60 09/29/2023   Lab Results  Component Value Date   CHOL 110 11/03/2022  HDL 48 11/03/2022   LDLCALC 42 11/03/2022   TRIG 111 11/03/2022   CHOLHDL 2.3 11/03/2022   Lab Results  Component Value Date   TSH 1.020 11/03/2022   Lab Results  Component Value Date   HGBA1C 5.7 (A) 06/15/2023   Lab Results  Component Value Date   WBC 4.9 09/29/2023   HGB 11.8 (L) 09/29/2023   HCT 35.5 (L)  09/29/2023   MCV 87.7 09/29/2023   PLT 242 09/29/2023   Lab Results  Component Value Date   ALT 18 09/29/2023   AST 19 09/29/2023   ALKPHOS 48 09/29/2023   BILITOT 0.7 09/29/2023   Lab Results  Component Value Date   VD25OH 29.3 (L) 05/07/2015     Patient Active Problem List   Diagnosis Date Noted   Carpal tunnel syndrome of right wrist 11/11/2023   Vitamin D deficiency 11/11/2023   Elevated serum creatinine 12/02/2022   Trigger thumb of left hand 11/06/2022   Encounter for antineoplastic chemotherapy 05/06/2022   Periorbital edema of both eyes 05/06/2022   Chemotherapy induced diarrhea 05/06/2022   Anemia due to antineoplastic chemotherapy 05/06/2022   Hypokalemia 05/06/2022   Chemotherapy-induced neutropenia (HCC) 10/24/2021   Gastroesophageal reflux disease with esophagitis 10/24/2021   BCR/ABL1-positive chronic myeloid leukemia (CML) in remission (HCC) 11/17/2020   Abnormal blood smear 10/25/2020   Type II diabetes mellitus with complication (HCC) 02/10/2017   Dry mouth 10/10/2016   Allergic rhinitis, seasonal 04/30/2015   Hepatic steatosis 04/30/2015   Osteoarthritis of both hands 04/30/2015   Genital herpes 04/04/2009   Depression, major, single episode, in partial remission (HCC) 08/18/1998   Essential hypertension 08/18/1998   Hyperlipidemia associated with type 2 diabetes mellitus (HCC) 08/18/1998   Primary insomnia 08/18/1998    Allergies  Allergen Reactions   Dapagliflozin Other (See Comments)   Clonazepam Other (See Comments)    Nocturnal enuresis; Lethargy    Trazodone Anxiety and Other (See Comments)    Dizziness     Past Surgical History:  Procedure Laterality Date   CYST EXCISION Left 2017   foot   EYE SURGERY  11/16/1996   myopia laser surgery   HAND SURGERY  07/2012   MASS EXCISION Right 08/13/2021   Procedure: EXCISION LOWER LIP MASS;  Surgeon: Geanie Logan, MD;  Location: [facility];  Service: ENT;  Laterality: Right;   Diabetic    Social History   Tobacco Use   Smoking status: Never   Smokeless tobacco: Never   Tobacco comments:    smoking cessation materials not required  Vaping Use   Vaping status: Never Used  Substance Use Topics   Alcohol use: No   Drug use: No     Medication list has been reviewed and updated.  Current Meds  Medication Sig   acetaminophen (TYLENOL) 500 MG tablet Take 2 tablets (1,000 mg total) by mouth in the morning and at bedtime.   bosutinib (BOSULIF) 400 MG tablet Take 1 tablet (400 mg total) by mouth daily with breakfast.   Coenzyme Q10 (CO Q 10) 100 MG CAPS Take 2 capsules by mouth daily.   estazolam (PROSOM) 2 MG tablet TAKE 1 TABLET BY MOUTH EVERYDAY AT BEDTIME   hydrocortisone cream 0.5 % Apply 1 application topically 2 (two) times daily. Apply to periorbital area twice daily.   Lancets (FREESTYLE) lancets Use to test BS once daily   loperamide (IMODIUM) 2 MG capsule Take 1 capsule (2 mg total) by mouth See admin instructions. Initial: 4 mg,the 2  mg every 2 hours (4 mg every 4 hours at night) until diarrhea stops.maximum: 16 mg/day   Lutein 20 MG TABS Take 1 tablet by mouth daily.    MULTIPLE VITAMIN PO 1 tablet daily.    olopatadine (PATANOL) 0.1 % ophthalmic solution Apply to eye.   Omega-3 Fatty Acids (FISH OIL PEARLS PO) Take by mouth.   ondansetron (ZOFRAN) 4 MG tablet Take 1 tablet (4 mg total) by mouth every 8 (eight) hours as needed for nausea or vomiting.   potassium chloride (KLOR-CON M) 10 MEQ tablet Take 1 tablet (10 mEq total) by mouth daily as needed (take on days you have diarrhea.).   prednisoLONE acetate (PRED FORTE) 1 % ophthalmic suspension Place 1 drop into the right eye 4 (four) times daily.   PREVIDENT 5000 PLUS 1.1 % CREA dental cream Place 1 Application onto teeth as directed.   SELENIUM PO Take by mouth daily.   triamcinolone cream (KENALOG) 0.5 % APPLY 1 APPLICATION TOPICALLY 3 (THREE) TIMES DAILY. TO RASH ON ABDOMEN   Vitamin D,  Ergocalciferol, (DRISDOL) 1.25 MG (50000 UNIT) CAPS capsule Take 50,000 Units by mouth every 7 (seven) days.   vitamin E (VITAMIN E) 400 UNIT capsule Take 1 capsule (400 Units total) by mouth daily.   zinc gluconate 50 MG tablet Take 50 mg by mouth daily.   [DISCONTINUED] acyclovir (ZOVIRAX) 200 MG capsule Take 1 capsule (200 mg total) by mouth 5 (five) times daily.   [DISCONTINUED] amLODipine (NORVASC) 2.5 MG tablet Take 1 tablet (2.5 mg total) by mouth daily.   [DISCONTINUED] atenolol (TENORMIN) 50 MG tablet Take 1 tablet (50 mg total) by mouth daily.   [DISCONTINUED] atorvastatin (LIPITOR) 10 MG tablet Take 1 tablet (10 mg total) by mouth at bedtime.   [DISCONTINUED] glucose blood (FREESTYLE LITE) test strip Use to test BS twice daily   [DISCONTINUED] hydrochlorothiazide (HYDRODIURIL) 25 MG tablet Take 1 tablet (25 mg total) by mouth daily.   [DISCONTINUED] metFORMIN (GLUCOPHAGE) 1000 MG tablet Take 1 tablet (1,000 mg total) by mouth 2 (two) times daily with a meal.   [DISCONTINUED] omeprazole (PRILOSEC) 20 MG capsule Take 1 capsule (20 mg total) by mouth daily.   [DISCONTINUED] PARoxetine (PAXIL) 20 MG tablet Take 1 tablet (20 mg total) by mouth daily.   [DISCONTINUED] sitaGLIPtin (JANUVIA) 100 MG tablet Take 100 mg by mouth daily.   [DISCONTINUED] valsartan (DIOVAN) 320 MG tablet Take 1 tablet (320 mg total) by mouth daily.       06/15/2023    1:26 PM 08/19/2022    3:20 PM 07/08/2022    2:26 PM 03/07/2022    3:44 PM  GAD 7 : Generalized Anxiety Score  Nervous, Anxious, on Edge 0 0 1 1  Control/stop worrying 0 0 0 0  Worry too much - different things 0 0 2 0  Trouble relaxing 0 0 0 0  Restless 0 0 0 0  Easily annoyed or irritable 0 0 0 0  Afraid - awful might happen 0 0 0 0  Total GAD 7 Score 0 0 3 1  Anxiety Difficulty Not difficult at all Not difficult at all Not difficult at all Not difficult at all       09/24/2023   10:09 AM 06/15/2023    1:26 PM 09/15/2022   10:37 AM   Depression screen PHQ 2/9  Decreased Interest 0 0 0  Down, Depressed, Hopeless 0 0 0  PHQ - 2 Score 0 0 0  Altered sleeping  0  Tired, decreased energy  0   Change in appetite  0   Feeling bad or failure about yourself   0   Trouble concentrating  0   Moving slowly or fidgety/restless  0   Suicidal thoughts  0   PHQ-9 Score  0   Difficult doing work/chores  Not difficult at all     BP Readings from Last 3 Encounters:  11/11/23 124/66  11/04/23 137/71  10/06/23 (!) 132/58    Physical Exam Vitals and nursing note reviewed.  Constitutional:      General: She is not in acute distress.    Appearance: She is well-developed.  HENT:     Head: Normocephalic and atraumatic.     Right Ear: Tympanic membrane and ear canal normal.     Left Ear: Tympanic membrane and ear canal normal.     Nose:     Right Sinus: No maxillary sinus tenderness.     Left Sinus: No maxillary sinus tenderness.  Eyes:     General: No scleral icterus.       Right eye: No discharge.        Left eye: No discharge.     Conjunctiva/sclera: Conjunctivae normal.  Neck:     Thyroid: No thyromegaly.     Vascular: No carotid bruit.  Cardiovascular:     Rate and Rhythm: Normal rate and regular rhythm.     Pulses: Normal pulses.     Heart sounds: Normal heart sounds.  Pulmonary:     Effort: Pulmonary effort is normal. No respiratory distress.     Breath sounds: No wheezing.  Abdominal:     General: Bowel sounds are normal.     Palpations: Abdomen is soft.     Tenderness: There is no abdominal tenderness.  Musculoskeletal:     Cervical back: Normal range of motion. No erythema.     Right lower leg: No edema.     Left lower leg: No edema.  Lymphadenopathy:     Cervical: No cervical adenopathy.  Skin:    General: Skin is warm and dry.     Findings: No rash.  Neurological:     Mental Status: She is alert and oriented to person, place, and time.     Cranial Nerves: No cranial nerve deficit.     Sensory:  No sensory deficit.     Deep Tendon Reflexes: Reflexes are normal and symmetric.  Psychiatric:        Attention and Perception: Attention normal.        Mood and Affect: Mood normal.    Diabetic Foot Exam - Simple   Simple Foot Form Diabetic Foot exam was performed with the following findings: Yes 11/11/2023 11:33 AM  Visual Inspection No deformities, no ulcerations, no other skin breakdown bilaterally: Yes Sensation Testing Intact to touch and monofilament testing bilaterally: Yes Pulse Check Posterior Tibialis and Dorsalis pulse intact bilaterally: Yes Comments      Wt Readings from Last 3 Encounters:  11/11/23 144 lb 8 oz (65.5 kg)  11/04/23 145 lb 6.4 oz (66 kg)  10/06/23 144 lb 8 oz (65.5 kg)    BP 124/66   Pulse 61   Ht 5\' 4"  (1.626 m)   Wt 144 lb 8 oz (65.5 kg)   SpO2 98%   BMI 24.80 kg/m   Assessment and Plan:  Problem List Items Addressed This Visit       Unprioritized   Depression, major, single episode, in partial remission (HCC) (Chronic)  Clinically stable on Paxil.   No SI or HI on evaluation. Plan to continue same medications for now.       Relevant Medications   PARoxetine (PAXIL) 20 MG tablet   Other Relevant Orders   TSH   Essential hypertension - Primary (Chronic)   Blood pressure is well controlled.  Current medications valsartan, amlodipine, atenolol and hydrochlorothiazide. Will continue same regimen along with efforts to limit dietary sodium.       Relevant Medications   amLODipine (NORVASC) 2.5 MG tablet   atenolol (TENORMIN) 50 MG tablet   atorvastatin (LIPITOR) 10 MG tablet   hydrochlorothiazide (HYDRODIURIL) 25 MG tablet   valsartan (DIOVAN) 320 MG tablet   Hyperlipidemia associated with type 2 diabetes mellitus (HCC) (Chronic)   LDL is  Lab Results  Component Value Date   LDLCALC 42 11/03/2022   Current regimen is atorvastatin.  No medication side effects noted. Goal LDL is < 70.       Relevant Medications    amLODipine (NORVASC) 2.5 MG tablet   atenolol (TENORMIN) 50 MG tablet   atorvastatin (LIPITOR) 10 MG tablet   hydrochlorothiazide (HYDRODIURIL) 25 MG tablet   metFORMIN (GLUCOPHAGE) 1000 MG tablet   sitaGLIPtin (JANUVIA) 100 MG tablet   valsartan (DIOVAN) 320 MG tablet   Other Relevant Orders   Lipid panel   Type II diabetes mellitus with complication (HCC) (Chronic)   Blood sugars have been stable.  No recent hypoglycemic events requiring assistance. Currently medications are Januvia and MTF. Lab Results  Component Value Date   HGBA1C 5.7 (A) 06/15/2023   Last visit no changes were made.       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  Now CML recurrent and therapy changed to Bosutinib 400 mg daily       Relevant Medications   acyclovir (ZOVIRAX) 200 MG capsule   acetaminophen (TYLENOL) 500 MG tablet   Hepatic steatosis   Most recent hepatic panel was normal.       Genital herpes   Relevant Medications   acyclovir (ZOVIRAX) 200 MG capsule   Gastroesophageal reflux disease with esophagitis   Relevant Medications   omeprazole (PRILOSEC) 20 MG capsule   Carpal tunnel syndrome of right wrist   Worsening right sided symptoms  Recommend wrist splint while sleeping Tylenol 1000 mg bid      Relevant Medications   PARoxetine (PAXIL) 20 MG tablet   acetaminophen (TYLENOL) 500 MG tablet   Vitamin D deficiency   Will check levels. Continue daily supplements.      Relevant Orders   VITAMIN D 25 Hydroxy (Vit-D Deficiency, Fractures)   Other Visit  Diagnoses       Long term current use of oral hypoglycemic drug         Encounter for screening mammogram for breast cancer           Return in about 4 months (around 03/12/2024) for DM, HTN.    Reubin Milan, MD [facility] [place]

## 2023-11-11 NOTE — Assessment & Plan Note (Signed)
 LDL is  Lab Results  Component Value Date   LDLCALC 42 11/03/2022   Current regimen is atorvastatin.  No medication side effects noted. Goal LDL is < 70.

## 2023-11-12 LAB — LIPID PANEL
Chol/HDL Ratio: 3.1 ratio (ref 0.0–4.4)
Cholesterol, Total: 147 mg/dL (ref 100–199)
HDL: 48 mg/dL (ref >39)
LDL Chol Calc (NIH): 76 mg/dL (ref 0–99)
Triglycerides: 130 mg/dL (ref 0–149)
VLDL Cholesterol Cal: 23 mg/dL (ref 5–40)

## 2023-11-12 LAB — HEMOGLOBIN A1C
Est. average glucose Bld gHb Est-mCnc: 137 mg/dL
Hgb A1c MFr Bld: 6.4 % — ABNORMAL HIGH (ref 4.8–5.6)

## 2023-11-12 LAB — MICROALBUMIN / CREATININE URINE RATIO
Creatinine, Urine: 77.7 mg/dL
Microalb/Creat Ratio: 6 mg/g{creat} (ref 0–29)
Microalbumin, Urine: 4.9 ug/mL

## 2023-11-12 LAB — TSH: TSH: 2.85 u[IU]/mL (ref 0.450–4.500)

## 2023-11-12 LAB — VITAMIN D 25 HYDROXY (VIT D DEFICIENCY, FRACTURES): Vit D, 25-Hydroxy: 56.3 ng/mL (ref 30.0–100.0)

## 2023-11-13 ENCOUNTER — Encounter: Payer: Self-pay | Admitting: Internal Medicine

## 2023-11-27 ENCOUNTER — Inpatient Hospital Stay: Attending: Oncology

## 2023-11-27 DIAGNOSIS — C9211 Chronic myeloid leukemia, BCR/ABL-positive, in remission: Secondary | ICD-10-CM | POA: Insufficient documentation

## 2023-11-27 LAB — COMPREHENSIVE METABOLIC PANEL WITH GFR
ALT: 26 U/L (ref 0–44)
AST: 27 U/L (ref 15–41)
Albumin: 4.1 g/dL (ref 3.5–5.0)
Alkaline Phosphatase: 52 U/L (ref 38–126)
Anion gap: 10 (ref 5–15)
BUN: 12 mg/dL (ref 8–23)
CO2: 23 mmol/L (ref 22–32)
Calcium: 9.4 mg/dL (ref 8.9–10.3)
Chloride: 101 mmol/L (ref 98–111)
Creatinine, Ser: 0.9 mg/dL (ref 0.44–1.00)
GFR, Estimated: >60 mL/min (ref >60)
Glucose, Bld: 155 mg/dL — ABNORMAL HIGH (ref 70–99)
Potassium: 3.7 mmol/L (ref 3.5–5.1)
Sodium: 134 mmol/L — ABNORMAL LOW (ref 135–145)
Total Bilirubin: 0.9 mg/dL (ref 0.0–1.2)
Total Protein: 7 g/dL (ref 6.5–8.1)

## 2023-11-27 LAB — CBC WITH DIFFERENTIAL/PLATELET
Abs Immature Granulocytes: 0.01 10*3/uL (ref 0.00–0.07)
Basophils Absolute: 0 10*3/uL (ref 0.0–0.1)
Basophils Relative: 1 %
Eosinophils Absolute: 0.2 10*3/uL (ref 0.0–0.5)
Eosinophils Relative: 4 %
HCT: 35.2 % — ABNORMAL LOW (ref 36.0–46.0)
Hemoglobin: 11.6 g/dL — ABNORMAL LOW (ref 12.0–15.0)
Immature Granulocytes: 0 %
Lymphocytes Relative: 25 %
Lymphs Abs: 1.7 10*3/uL (ref 0.7–4.0)
MCH: 28 pg (ref 26.0–34.0)
MCHC: 33 g/dL (ref 30.0–36.0)
MCV: 85 fL (ref 80.0–100.0)
Monocytes Absolute: 0.5 10*3/uL (ref 0.1–1.0)
Monocytes Relative: 7 %
Neutro Abs: 4.2 10*3/uL (ref 1.7–7.7)
Neutrophils Relative %: 63 %
Platelets: 238 10*3/uL (ref 150–400)
RBC: 4.14 MIL/uL (ref 3.87–5.11)
RDW: 13.8 % (ref 11.5–15.5)
WBC: 6.7 10*3/uL (ref 4.0–10.5)
nRBC: 0 % (ref 0.0–0.2)

## 2023-11-30 ENCOUNTER — Encounter: Payer: Self-pay | Admitting: Oncology

## 2023-12-01 ENCOUNTER — Other Ambulatory Visit (HOSPITAL_COMMUNITY): Payer: Self-pay

## 2023-12-01 ENCOUNTER — Other Ambulatory Visit: Payer: Self-pay | Admitting: Pharmacy Technician

## 2023-12-01 ENCOUNTER — Other Ambulatory Visit: Payer: Self-pay

## 2023-12-01 ENCOUNTER — Telehealth: Payer: Self-pay | Admitting: Pharmacist

## 2023-12-01 ENCOUNTER — Telehealth: Payer: Self-pay | Admitting: Pharmacy Technician

## 2023-12-01 DIAGNOSIS — C9211 Chronic myeloid leukemia, BCR/ABL-positive, in remission: Secondary | ICD-10-CM

## 2023-12-01 MED ORDER — BOSUTINIB 400 MG PO TABS: 400.0000 mg | ORAL_TABLET | Freq: Every day | ORAL | 2 refills | Status: DC

## 2023-12-01 MED ORDER — BOSUTINIB 400 MG PO TABS
400.0000 mg | ORAL_TABLET | Freq: Every day | ORAL | 0 refills | Status: DC
  Filled 2023-12-01: qty 30, 30d supply, fill #0

## 2023-12-01 NOTE — Telephone Encounter (Signed)
 Oral Oncology Patient Advocate Encounter   Was successful in enrolling patient in free trial program for Bosulif.   This will allow a one time 30 day fill of medication at no charge.  The billing information is as follows and has been shared with [facility].   RxBin: [id number] PCN: N/[id number] Member ID: [id number] Group ID: [id number]  Patty Benjaman Branch, CPhT Oncology Pharmacy Patient Advocate [facility] Direct Number: [phone] Fax: [phone]

## 2023-12-01 NOTE — Telephone Encounter (Signed)
 Clinical Pharmacist Practitioner Encounter   Received new prescription for Bosulif (bosutinib) for the treatment of CML, planned duration until disease control or unacceptable drug toxicity.  CMP from 11/27/23 assessed, no relevant lab abnormalities. Patient renal function is borderline, if patient has issues tolerating medication could consider dose reducing to 300mg  daily (dose for CrCl 30-<59ml/min). Prescription dose and frequency assessed.   Current medication list in Epic reviewed, one DDIs with bosutinib identified: Omeprazole: Inhibitors of the Proton Pump (PPIs and PCABs) may decrease the serum concentration of Bosutinib. It is recommended to avoid the combination of PPIs and bosutinib. Consider alternatives such as antacids or histamine-2 receptor antagonists (H2RAs), which can be used but still require separation from bosutinib.   Evaluated chart and no patient barriers to medication adherence identified.   Prescription has been e-scribed to the [facility] for benefits analysis and approval.  Oral Oncology Clinic will continue to follow for insurance authorization, copayment issues, initial counseling and start date.  Patient agreed to treatment on 11/04/23 per MD documentation.  Hezikiah Retzloff N. Leylani Duley, PharmD, BCOP, CPP Hematology/Oncology Clinical Pharmacist [facility] Oral Chemotherapy Navigation [facility] [phone]  12/01/2023 9:20 AM

## 2023-12-01 NOTE — Progress Notes (Signed)
 Patient education documented in EPIC note on 12/01/23.

## 2023-12-01 NOTE — Progress Notes (Signed)
 Specialty Pharmacy Initial Fill Coordination Note  Jenna Stein is a 73 y.o. female contacted today regarding refills of specialty medication(s) Bosutinib (BOSULIF) .  Patient requested Delivery  on 12/03/23  to verified address [address] [place] [address]  Bill voucher.  Medication will be filled on 12/02/2023.   Patient is aware of $0 copayment. Bill voucher  Patty Benjaman Branch, CPhT Oncology Pharmacy Patient Advocate [facility] Direct Number: [phone] Fax: [phone]

## 2023-12-01 NOTE — Telephone Encounter (Signed)
 Oral Oncology Patient Advocate Encounter  After completing a benefits investigation, prior authorization for Bosulif is not required at this time through [facility].  Patient's copay is $2,999.11.    Patient will be filling 30 day vocuher with [facility] and then transferring future refills to [facility] pharmacy for $0 co-pay.   Patty Benjaman Branch, CPhT Oncology Pharmacy Patient Advocate [facility] Direct Number: [phone] Fax: [phone]

## 2023-12-01 NOTE — Telephone Encounter (Signed)
 Clinical Pharmacist Practitioner Encounter   Patient Education I spoke with patient for overview of new oral chemotherapy medication: Bosulif (bosutinib) for the treatment of CML, planned duration until disease control or unacceptable drug toxicity.   Counseled patient on administration, dosing, side effects, monitoring, drug-food interactions, safe handling, storage, and disposal. Patient will take 1 tablet (400 mg total) by mouth daily. Take with food.   Side effects include but not limited to: diarrhea, nausea, rash, decreased wbc/hgb/plt, fatigue.   Diarrhea: patient knows to use loperamide as needed and call the office if they are having four or more loose stool per day   Reviewed with patient importance of keeping a medication schedule and plan for any missed doses.  After discussion with patient no patient barriers to medication adherence identified.   Jenna Stein voiced understanding and appreciation. All questions answered. Medication handout provided.  Provided patient with Oral Chemotherapy Navigation Clinic phone number. Patient knows to call the office with questions or concerns. Oral Chemotherapy Navigation Clinic will continue to follow.  Jenna Stein, PharmD, BCOP, CPP Hematology/Oncology Clinical Pharmacist [facility] Oral Chemotherapy Navigation [facility] [phone]  12/01/2023 11:18 AM

## 2023-12-02 ENCOUNTER — Other Ambulatory Visit: Payer: Self-pay

## 2023-12-02 LAB — BCR-ABL1 FISH
Cells Analyzed: 200
Cells Counted: 200

## 2023-12-02 LAB — BCR-ABL1, CML/ALL, PCR, QUANT
E1A2 Transcript: <0.0032 %
b2a2 transcript: 0.0366 %
b3a2 transcript: <0.0032 %

## 2023-12-07 MED ORDER — BOSUTINIB 400 MG PO TABS: 400.0000 mg | ORAL_TABLET | Freq: Every day | ORAL | 2 refills | Status: DC

## 2023-12-07 NOTE — Telephone Encounter (Signed)
 [facility] faxed the office about the drug interaction between PPIs and Bosulif . They asked that we "please issue a new eRx with a note indicating how to proceed".   Per their request new rx was escribed with the following note to pharmacy "Patient is stopping omeprazole  and using Pepcid instead, please fill Bosulif  as prescribed"

## 2023-12-07 NOTE — Addendum Note (Signed)
 Addended by: Thaddeus Filippo on: 12/07/2023 12:02 PM   Modules accepted: Orders

## 2023-12-16 ENCOUNTER — Other Ambulatory Visit

## 2023-12-17 ENCOUNTER — Ambulatory Visit: Admitting: Pharmacist

## 2023-12-17 ENCOUNTER — Ambulatory Visit: Admitting: Oncology

## 2023-12-17 ENCOUNTER — Other Ambulatory Visit

## 2023-12-17 DIAGNOSIS — E119 Type 2 diabetes mellitus without complications: Secondary | ICD-10-CM | POA: Diagnosis not present

## 2023-12-17 DIAGNOSIS — H2513 Age-related nuclear cataract, bilateral: Secondary | ICD-10-CM | POA: Diagnosis not present

## 2023-12-17 DIAGNOSIS — H43813 Vitreous degeneration, bilateral: Secondary | ICD-10-CM | POA: Diagnosis not present

## 2023-12-17 DIAGNOSIS — H04123 Dry eye syndrome of bilateral lacrimal glands: Secondary | ICD-10-CM | POA: Diagnosis not present

## 2023-12-17 LAB — HM DIABETES EYE EXAM

## 2023-12-22 ENCOUNTER — Inpatient Hospital Stay: Attending: Oncology

## 2023-12-22 ENCOUNTER — Encounter: Payer: Self-pay | Admitting: Oncology

## 2023-12-22 ENCOUNTER — Inpatient Hospital Stay (HOSPITAL_BASED_OUTPATIENT_CLINIC_OR_DEPARTMENT_OTHER): Admitting: Oncology

## 2023-12-22 ENCOUNTER — Inpatient Hospital Stay: Admitting: Pharmacist

## 2023-12-22 VITALS — BP 134/61 | HR 67 | Temp 98.3°F | Resp 18 | Wt 144.6 lb

## 2023-12-22 DIAGNOSIS — C9211 Chronic myeloid leukemia, BCR/ABL-positive, in remission: Secondary | ICD-10-CM

## 2023-12-22 DIAGNOSIS — D6481 Anemia due to antineoplastic chemotherapy: Secondary | ICD-10-CM

## 2023-12-22 DIAGNOSIS — Z5111 Encounter for antineoplastic chemotherapy: Secondary | ICD-10-CM

## 2023-12-22 DIAGNOSIS — T451X5A Adverse effect of antineoplastic and immunosuppressive drugs, initial encounter: Secondary | ICD-10-CM

## 2023-12-22 LAB — CBC WITH DIFFERENTIAL (CANCER CENTER ONLY)
Abs Immature Granulocytes: 0.02 10*3/uL (ref 0.00–0.07)
Basophils Absolute: 0 10*3/uL (ref 0.0–0.1)
Basophils Relative: 1 %
Eosinophils Absolute: 0.2 10*3/uL (ref 0.0–0.5)
Eosinophils Relative: 4 %
HCT: 33 % — ABNORMAL LOW (ref 36.0–46.0)
Hemoglobin: 11.1 g/dL — ABNORMAL LOW (ref 12.0–15.0)
Immature Granulocytes: 0 %
Lymphocytes Relative: 39 %
Lymphs Abs: 1.9 10*3/uL (ref 0.7–4.0)
MCH: 28.4 pg (ref 26.0–34.0)
MCHC: 33.6 g/dL (ref 30.0–36.0)
MCV: 84.4 fL (ref 80.0–100.0)
Monocytes Absolute: 0.4 10*3/uL (ref 0.1–1.0)
Monocytes Relative: 8 %
Neutro Abs: 2.3 10*3/uL (ref 1.7–7.7)
Neutrophils Relative %: 48 %
Platelet Count: 230 10*3/uL (ref 150–400)
RBC: 3.91 MIL/uL (ref 3.87–5.11)
RDW: 14.6 % (ref 11.5–15.5)
WBC Count: 4.8 10*3/uL (ref 4.0–10.5)
nRBC: 0 % (ref 0.0–0.2)

## 2023-12-22 LAB — CMP (CANCER CENTER ONLY)
ALT: 21 U/L (ref 0–44)
AST: 24 U/L (ref 15–41)
Albumin: 4.2 g/dL (ref 3.5–5.0)
Alkaline Phosphatase: 50 U/L (ref 38–126)
Anion gap: 9 (ref 5–15)
BUN: 15 mg/dL (ref 8–23)
CO2: 27 mmol/L (ref 22–32)
Calcium: 9.7 mg/dL (ref 8.9–10.3)
Chloride: 99 mmol/L (ref 98–111)
Creatinine: 0.84 mg/dL (ref 0.44–1.00)
GFR, Estimated: >60 mL/min (ref >60)
Glucose, Bld: 121 mg/dL — ABNORMAL HIGH (ref 70–99)
Potassium: 3.7 mmol/L (ref 3.5–5.1)
Sodium: 135 mmol/L (ref 135–145)
Total Bilirubin: 0.8 mg/dL (ref 0.0–1.2)
Total Protein: 7.2 g/dL (ref 6.5–8.1)

## 2023-12-22 NOTE — Assessment & Plan Note (Signed)
 Stable counts.  Monitor.

## 2023-12-22 NOTE — Progress Notes (Signed)
 Clinical Pharmacist Practitioner Clinic [facility]  [phone] [phone]  Patient Care Team: Sheron Dixons, MD as PCP - General (Internal Medicine) [facility] (Ophthalmology) Timmy Forbes, MD as Consulting Physician (Oncology) [facility] (Gastroenterology)   Name of the patient: Jenna Stein  [id number]  29-Jan-1951   Date of visit: 12/22/23  HPI: Patient is a 73 y.o. female with CML. Currently being treated with bosutinib.   Reason for Consult: Oral chemotherapy follow-up for bosutinib therapy.   PAST MEDICAL HISTORY: Past Medical History:  Diagnosis Date   Allergy    Anemia due to antineoplastic chemotherapy 05/06/2022   Anxiety    Arthritis 2010   Chronic myeloid leukemia, BCR/ABL-positive, not having achieved remission (HCC) 11/17/2020   Depression    Diabetes mellitus without complication (HCC)    GERD (gastroesophageal reflux disease)    Hyperlipidemia    Hypertension     HEMATOLOGY/ONCOLOGY HISTORY:  Oncology History   No history exists.    ALLERGIES:  is allergic to dapagliflozin , clonazepam, and trazodone.  MEDICATIONS:  Current Outpatient Medications  Medication Sig Dispense Refill   acetaminophen  (TYLENOL ) 500 MG tablet Take 2 tablets (1,000 mg total) by mouth in the morning and at bedtime. 180 tablet 0   acyclovir  (ZOVIRAX ) 200 MG capsule Take 1 capsule (200 mg total) by mouth 5 (five) times daily. 90 capsule 1   amLODipine  (NORVASC ) 2.5 MG tablet Take 1 tablet (2.5 mg total) by mouth daily. 90 tablet 1   atenolol  (TENORMIN ) 50 MG tablet Take 1 tablet (50 mg total) by mouth daily. 90 tablet 1   atorvastatin  (LIPITOR) 10 MG tablet Take 1 tablet (10 mg total) by mouth at bedtime. 90 tablet 1   bosutinib (BOSULIF ) 400 MG tablet Take 1 tablet (400 mg total) by mouth daily. Take with food. 30 tablet 2   Coenzyme Q10 (CO Q 10) 100 MG CAPS Take 2 capsules by mouth daily.     estazolam  (PROSOM ) 2  MG tablet TAKE 1 TABLET BY MOUTH EVERYDAY AT BEDTIME 30 tablet 5   glucose blood (FREESTYLE LITE) test strip Use to test BS twice daily 100 each 4   hydrochlorothiazide  (HYDRODIURIL ) 25 MG tablet Take 1 tablet (25 mg total) by mouth daily. 90 tablet 3   hydrocortisone  cream 0.5 % Apply 1 application topically 2 (two) times daily. Apply to periorbital area twice daily. 28 g 1   Lancets (FREESTYLE) lancets Use to test BS once daily 100 each 12   loperamide  (IMODIUM ) 2 MG capsule Take 1 capsule (2 mg total) by mouth See admin instructions. Initial: 4 mg,the 2 mg every 2 hours (4 mg every 4 hours at night) until diarrhea stops.maximum: 16 mg/day 60 capsule 2   Lutein 20 MG TABS Take 1 tablet by mouth daily.      metFORMIN  (GLUCOPHAGE ) 1000 MG tablet Take 1 tablet (1,000 mg total) by mouth 2 (two) times daily with a meal. 180 tablet 3   MULTIPLE VITAMIN PO 1 tablet daily.      olopatadine (PATANOL) 0.1 % ophthalmic solution Apply to eye.     Omega-3 Fatty Acids (FISH OIL PEARLS PO) Take by mouth.     ondansetron  (ZOFRAN ) 4 MG tablet Take 1 tablet (4 mg total) by mouth every 8 (eight) hours as needed for nausea or vomiting. 20 tablet 0   PARoxetine  (PAXIL ) 20 MG tablet Take 1 tablet (20 mg total) by mouth daily. 90 tablet 3   potassium chloride  (  KLOR-CON  M) 10 MEQ tablet Take 1 tablet (10 mEq total) by mouth daily as needed (take on days you have diarrhea.). 90 tablet 1   prednisoLONE acetate (PRED FORTE) 1 % ophthalmic suspension Place 1 drop into the right eye 4 (four) times daily.     PREVIDENT 5000 PLUS 1.1 % CREA dental cream Place 1 Application onto teeth as directed.     SELENIUM PO Take by mouth daily.     sitaGLIPtin  (JANUVIA ) 100 MG tablet Take 1 tablet (100 mg total) by mouth daily. 90 tablet 3   triamcinolone  cream (KENALOG ) 0.5 % APPLY 1 APPLICATION TOPICALLY 3 (THREE) TIMES DAILY. TO RASH ON ABDOMEN 30 g 0   valsartan  (DIOVAN ) 320 MG tablet Take 1 tablet (320 mg total) by mouth daily. 90  tablet 3   Vitamin D , Ergocalciferol , (DRISDOL) 1.25 MG (50000 UNIT) CAPS capsule Take 50,000 Units by mouth every 7 (seven) days.     vitamin E  (VITAMIN E ) 400 UNIT capsule Take 1 capsule (400 Units total) by mouth daily. 1 capsule 0   zinc gluconate 50 MG tablet Take 50 mg by mouth daily.     No current facility-administered medications for this visit.    VITAL SIGNS: There were no vitals taken for this visit. There were no vitals filed for this visit.  Estimated body mass index is 24.82 kg/m as calculated from the following:   Height as of 11/11/23: 5\' 4"  (1.626 m).   Weight as of an earlier encounter on 12/22/23: 65.6 kg (144 lb 9.6 oz).  LABS: CBC:    Component Value Date/Time   WBC 4.8 12/22/2023 1445   WBC 6.7 11/27/2023 1443   HGB 11.1 (L) 12/22/2023 1445   HGB 10.3 (L) 10/24/2021 1141   HCT 33.0 (L) 12/22/2023 1445   HCT 30.7 (L) 10/24/2021 1141   PLT 230 12/22/2023 1445   PLT 194 10/24/2021 1141   MCV 84.4 12/22/2023 1445   MCV 98 (H) 10/24/2021 1141   NEUTROABS 2.3 12/22/2023 1445   NEUTROABS 1.6 10/24/2021 1141   LYMPHSABS 1.9 12/22/2023 1445   LYMPHSABS 1.8 10/24/2021 1141   MONOABS 0.4 12/22/2023 1445   EOSABS 0.2 12/22/2023 1445   EOSABS 0.5 (H) 10/24/2021 1141   BASOSABS 0.0 12/22/2023 1445   BASOSABS 0.1 10/24/2021 1141   Comprehensive Metabolic Panel:    Component Value Date/Time   NA 135 12/22/2023 1445   NA 143 11/03/2022 1155   K 3.7 12/22/2023 1445   CL 99 12/22/2023 1445   CO2 27 12/22/2023 1445   BUN 15 12/22/2023 1445   BUN 11 11/03/2022 1155   CREATININE 0.84 12/22/2023 1445   GLUCOSE 121 (H) 12/22/2023 1445   CALCIUM  9.7 12/22/2023 1445   AST 24 12/22/2023 1445   ALT 21 12/22/2023 1445   ALKPHOS 50 12/22/2023 1445   BILITOT 0.8 12/22/2023 1445   PROT 7.2 12/22/2023 1445   PROT 6.3 11/03/2022 1155   ALBUMIN 4.2 12/22/2023 1445   ALBUMIN 4.3 11/03/2022 1155     Present during today's visit: patient and her husband  Assessment and  Plan: Continue bosutinib 400mg  daily She has had no issue stopping her omeprazole  and switching to famotidine    Oral Chemotherapy Side Effect/Intolerance:  Diarrhea: occasional, not requiring medication management Nausea: occasional, not requiring medication management No reported rash  Oral Chemotherapy Adherence: None reported No patient barriers to medication adherence identified.   New medications: None reported  Medication Access Issues: No issues, patient is set-up with  [facility] for her medication  Patient expressed understanding and was in agreement with this plan. She also understands that She can call clinic at any time with any questions, concerns, or complaints.   Follow-up plan: RTC as scheduled  Thank you for allowing me to participate in the care of this very pleasant patient.   Time Total: 10 mins  Visit consisted of counseling and education on dealing with issues of symptom management in the setting of serious and potentially life-threatening illness.Greater than 50%  of this time was spent counseling and coordinating care related to the above assessment and plan.  Signed by: Jakeia Carreras N. Kaemon Barnett, PharmD, Lorraine Roses, CPP Hematology/Oncology Clinical Pharmacist Practitioner [facility] [phone]  12/22/2023 3:38 PM

## 2023-12-22 NOTE — Progress Notes (Signed)
 Hematology/Oncology Progress note [phone] [phone]     Chief Complaint: Jenna Stein is a 73 y.o.Jenna Stein female with chronic phase CML who is seen for management of CML  ASSESSMENT & PLAN:   BCR/ABL1-positive chronic myeloid leukemia (CML) in remission (HCC) Chronic myelogenous leukemia-low risk disease [Sokal, has 4, ELTS] March 2025 BCR ABL QT PCR b2a2 transcript 0.0429, lost MR4.5, after stopping Gleevec  for 6 months.  12/07/2023, started on bosutinib 400 mg daily. Overall she tolerates very well. Continue current regimen.   Anemia due to antineoplastic chemotherapy Stable counts.  Monitor.  Encounter for antineoplastic chemotherapy Treatment as planned above   Orders Placed This Encounter  Procedures   CBC with Differential (Cancer Center Only)    Standing Status:   Future    Expected Date:   01/22/2024    Expiration Date:   12/21/2024   CMP (Cancer Center only)    Standing Status:   Future    Expected Date:   01/22/2024    Expiration Date:   12/21/2024   BCR-ABL1 FISH    Standing Status:   Future    Expected Date:   01/22/2024    Expiration Date:   12/21/2024   BCR-ABL1, CML/ALL, PCR, QUANT    Standing Status:   Future    Expected Date:   01/22/2024    Expiration Date:   12/21/2024   Follow-up 4 weeks  all questions were answered. The patient knows to call the clinic with any problems, questions or concerns. We spent sufficient time to discuss many aspect of care, questions were answered to patient's satisfaction.  Timmy Forbes, MD, PhD [facility] Hematology Oncology 12/22/2023    .  PERTINENT ONCOLOGY HISTORY Jenna Stein is a 73 y.o.afemale who has above oncology history reviewed by me today presented for follow up visit for management of CML Patient previously followed up by Dr.Corcoran, patient switched care to me on 01/03/2021 Extensive medical record review was performed by me  10/09/2020 abnormal peripheral smear (3% myelocytes) 10/25/2020   lab work-up revealed a hematocrit of 40.8, hemoglobin 13.7, platelets 352,000, WBC 10,200. LDH was 174.  BCR-ABL showed 70% of nuclei positive for BCR/ABL1 fusion.  Flow cytometry revealed left shifted aberrant neutrophils, aberrant monocytes and absolute basophilia.   BCR-ABL1 revealed 123.6615% b2a2, < 0.0032% b3a2, and < 0.0032% E1A2 transcript. The ABL1 e13a2 (b2a2, p210) fusion transcript was positive.   11/05/2020 bone marrow biopsy showed  slightly hypercellular marrow with mild granulocytic proliferation and atypical megakaryocytes. The combined bone marrow and peripheral blood findings were very limited but were generally c/w early involvement by chronic myeloid leukemia, chronic phase especially in the presence of previously documented positivity for BCR/ABL1 fusion in peripheral blood analysis.  Cytogenetics revealed 12, XX, t(9;22)(q34.1; q11.2)[19] / 72, XX [1].  Molecular genetics showed a BCR-ABL1 translocation t(9;22) with 65.5684 % BCR-ABL1/ABL1 (IS), major breakpoint p210, log reduction 0.001.  11/16/2020 abdominal ultrasound showed no splenomegaly (7.2 x 9.5 x 4.4 cm).   Relative risk (RR) Sokal 0.81 (intermediate) and Hasford 684.83 (low) based on age (67), spleen (0), platelets (309), basophils (6%), eosinophils (3%), and myeloblasts (occ-1%). ELTS risk score 1.5596 (low risk) based on 0% blasts and 1.6648 (intermediate-risk) based on 1% blasts.  11/28/2020 -November 2025 Gleevec  400 mg daily.  Stopped Gleevec  after molecular remission for 2 years.  March 2025, lost molecular remission  INTERVAL HISTORY Jenna Stein is a 73 y.o. female who has above history reviewed by me today presents for follow up  visit for management of CML 12/07/2023 started on bosutinib 400 mg daily. Patient reports feeling well.  Denies any side effects.    Past Medical History:  Diagnosis Date   Allergy    Anemia due to antineoplastic chemotherapy 05/06/2022   Anxiety    Arthritis 2010   Chronic  myeloid leukemia, BCR/ABL-positive, not having achieved remission (HCC) 11/17/2020   Depression    Diabetes mellitus without complication (HCC)    GERD (gastroesophageal reflux disease)    Hyperlipidemia    Hypertension     Past Surgical History:  Procedure Laterality Date   CYST EXCISION Left 2017   foot   EYE SURGERY  11/16/1996   myopia laser surgery   HAND SURGERY  07/2012   MASS EXCISION Right 08/13/2021   Procedure: EXCISION LOWER LIP MASS;  Surgeon: Von Grumbling, MD;  Location: [facility];  Service: ENT;  Laterality: Right;  Diabetic    Family History  Problem Relation Age of Onset   Hypertension Mother    Hyperlipidemia Mother    Arthritis Mother    Parkinsonism Father    Arthritis Other    Hypertension Other    Hyperlipidemia Other    Hypothyroidism Other    Breast cancer Sister 64   Cancer Sister     Social History:  reports that she has never smoked. She has never used smokeless tobacco. She reports that she does not drink alcohol and does not use drugs.   Allergies:  Allergies  Allergen Reactions   Dapagliflozin  Other (See Comments)   Clonazepam Other (See Comments)    Nocturnal enuresis; Lethargy    Trazodone Anxiety and Other (See Comments)    Dizziness     Current Medications: Current Outpatient Medications  Medication Sig Dispense Refill   acetaminophen  (TYLENOL ) 500 MG tablet Take 2 tablets (1,000 mg total) by mouth in the morning and at bedtime. 180 tablet 0   acyclovir  (ZOVIRAX ) 200 MG capsule Take 1 capsule (200 mg total) by mouth 5 (five) times daily. 90 capsule 1   amLODipine  (NORVASC ) 2.5 MG tablet Take 1 tablet (2.5 mg total) by mouth daily. 90 tablet 1   atenolol  (TENORMIN ) 50 MG tablet Take 1 tablet (50 mg total) by mouth daily. 90 tablet 1   atorvastatin  (LIPITOR) 10 MG tablet Take 1 tablet (10 mg total) by mouth at bedtime. 90 tablet 1   bosutinib (BOSULIF ) 400 MG tablet Take 1 tablet (400 mg total) by mouth daily. Take with  food. 30 tablet 2   Coenzyme Q10 (CO Q 10) 100 MG CAPS Take 2 capsules by mouth daily.     estazolam  (PROSOM ) 2 MG tablet TAKE 1 TABLET BY MOUTH EVERYDAY AT BEDTIME 30 tablet 5   glucose blood (FREESTYLE LITE) test strip Use to test BS twice daily 100 each 4   hydrochlorothiazide  (HYDRODIURIL ) 25 MG tablet Take 1 tablet (25 mg total) by mouth daily. 90 tablet 3   hydrocortisone  cream 0.5 % Apply 1 application topically 2 (two) times daily. Apply to periorbital area twice daily. 28 g 1   Lancets (FREESTYLE) lancets Use to test BS once daily 100 each 12   loperamide  (IMODIUM ) 2 MG capsule Take 1 capsule (2 mg total) by mouth See admin instructions. Initial: 4 mg,the 2 mg every 2 hours (4 mg every 4 hours at night) until diarrhea stops.maximum: 16 mg/day 60 capsule 2   Lutein 20 MG TABS Take 1 tablet by mouth daily.      metFORMIN  (GLUCOPHAGE ) 1000  MG tablet Take 1 tablet (1,000 mg total) by mouth 2 (two) times daily with a meal. 180 tablet 3   MULTIPLE VITAMIN PO 1 tablet daily.      olopatadine (PATANOL) 0.1 % ophthalmic solution Apply to eye.     Omega-3 Fatty Acids (FISH OIL PEARLS PO) Take by mouth.     ondansetron  (ZOFRAN ) 4 MG tablet Take 1 tablet (4 mg total) by mouth every 8 (eight) hours as needed for nausea or vomiting. 20 tablet 0   PARoxetine  (PAXIL ) 20 MG tablet Take 1 tablet (20 mg total) by mouth daily. 90 tablet 3   potassium chloride  (KLOR-CON  M) 10 MEQ tablet Take 1 tablet (10 mEq total) by mouth daily as needed (take on days you have diarrhea.). 90 tablet 1   prednisoLONE acetate (PRED FORTE) 1 % ophthalmic suspension Place 1 drop into the right eye 4 (four) times daily.     PREVIDENT 5000 PLUS 1.1 % CREA dental cream Place 1 Application onto teeth as directed.     SELENIUM PO Take by mouth daily.     sitaGLIPtin  (JANUVIA ) 100 MG tablet Take 1 tablet (100 mg total) by mouth daily. 90 tablet 3   triamcinolone  cream (KENALOG ) 0.5 % APPLY 1 APPLICATION TOPICALLY 3 (THREE) TIMES  DAILY. TO RASH ON ABDOMEN 30 g 0   valsartan  (DIOVAN ) 320 MG tablet Take 1 tablet (320 mg total) by mouth daily. 90 tablet 3   Vitamin D , Ergocalciferol , (DRISDOL) 1.25 MG (50000 UNIT) CAPS capsule Take 50,000 Units by mouth every 7 (seven) days.     vitamin E  (VITAMIN E ) 400 UNIT capsule Take 1 capsule (400 Units total) by mouth daily. 1 capsule 0   zinc gluconate 50 MG tablet Take 50 mg by mouth daily.     No current facility-administered medications for this visit.    Review of Systems  Constitutional:  Negative for chills, fever, malaise/fatigue and weight loss.  HENT:  Negative for sore throat.   Eyes:  Negative for redness.  Respiratory:  Negative for cough, shortness of breath and wheezing.   Cardiovascular:  Negative for chest pain, palpitations and leg swelling.  Gastrointestinal:  Negative for abdominal pain, blood in stool, nausea and vomiting.  Genitourinary:  Negative for dysuria.  Musculoskeletal:  Positive for joint pain.  Skin:  Negative for rash.  Neurological:  Negative for dizziness, tingling and tremors.  Endo/Heme/Allergies:  Does not bruise/bleed easily.  Psychiatric/Behavioral:  Negative for hallucinations.    Performance status (ECOG): 0 Vitals Blood pressure 134/61, pulse 67, temperature 98.3 F (36.8 C), resp. rate 18, weight 144 lb 9.6 oz (65.6 kg).  Physical Exam Constitutional:      General: She is not in acute distress.    Appearance: She is not diaphoretic.  HENT:     Head: Normocephalic and atraumatic.  Eyes:     General: No scleral icterus. Cardiovascular:     Rate and Rhythm: Normal rate.  Pulmonary:     Effort: Pulmonary effort is normal. No respiratory distress.  Abdominal:     General: There is no distension.  Musculoskeletal:        General: Normal range of motion.     Cervical back: Normal range of motion and neck supple.  Skin:    Findings: No erythema.  Neurological:     Mental Status: She is alert and oriented to person, place,  and time. Mental status is at baseline.     Motor: No abnormal muscle tone.  Psychiatric:  Mood and Affect: Mood and affect normal.

## 2023-12-22 NOTE — Assessment & Plan Note (Signed)
 Chronic myelogenous leukemia-low risk disease [Sokal, has 4, ELTS] March 2025 BCR ABL QT PCR b2a2 transcript 0.0429, lost MR4.5, after stopping Gleevec  for 6 months.  12/07/2023, started on bosutinib 400 mg daily. Overall she tolerates very well. Continue current regimen.

## 2023-12-22 NOTE — Assessment & Plan Note (Signed)
Treatment as planned above. 

## 2023-12-24 ENCOUNTER — Ambulatory Visit
Admission: RE | Admit: 2023-12-24 | Discharge: 2023-12-24 | Disposition: A | Discharge status: Home / Self Care | Source: Ambulatory Visit | Attending: Internal Medicine | Admitting: Internal Medicine

## 2023-12-24 DIAGNOSIS — Z1231 Encounter for screening mammogram for malignant neoplasm of breast: Secondary | ICD-10-CM | POA: Insufficient documentation

## 2024-01-04 ENCOUNTER — Other Ambulatory Visit

## 2024-01-12 ENCOUNTER — Ambulatory Visit: Admitting: Oncology

## 2024-01-25 ENCOUNTER — Other Ambulatory Visit

## 2024-01-25 ENCOUNTER — Ambulatory Visit: Admitting: Oncology

## 2024-02-08 ENCOUNTER — Inpatient Hospital Stay (HOSPITAL_BASED_OUTPATIENT_CLINIC_OR_DEPARTMENT_OTHER): Admitting: Oncology

## 2024-02-08 ENCOUNTER — Other Ambulatory Visit: Payer: Self-pay

## 2024-02-08 ENCOUNTER — Encounter: Payer: Self-pay | Admitting: Oncology

## 2024-02-08 ENCOUNTER — Inpatient Hospital Stay: Attending: Oncology

## 2024-02-08 VITALS — BP 114/59 | HR 67 | Temp 97.6°F | Resp 18 | Wt 145.5 lb

## 2024-02-08 DIAGNOSIS — Z79899 Other long term (current) drug therapy: Secondary | ICD-10-CM | POA: Insufficient documentation

## 2024-02-08 DIAGNOSIS — D6481 Anemia due to antineoplastic chemotherapy: Secondary | ICD-10-CM | POA: Diagnosis not present

## 2024-02-08 DIAGNOSIS — C9211 Chronic myeloid leukemia, BCR/ABL-positive, in remission: Secondary | ICD-10-CM | POA: Diagnosis not present

## 2024-02-08 DIAGNOSIS — T451X5A Adverse effect of antineoplastic and immunosuppressive drugs, initial encounter: Secondary | ICD-10-CM

## 2024-02-08 DIAGNOSIS — R7989 Other specified abnormal findings of blood chemistry: Secondary | ICD-10-CM | POA: Diagnosis not present

## 2024-02-08 LAB — IRON AND TIBC
Iron: 70 ug/dL (ref 28–170)
Saturation Ratios: 18 % (ref 10.4–31.8)
TIBC: 396 ug/dL (ref 250–450)
UIBC: 326 ug/dL

## 2024-02-08 LAB — CBC WITH DIFFERENTIAL (CANCER CENTER ONLY)
Abs Immature Granulocytes: 0.01 10*3/uL (ref 0.00–0.07)
Basophils Absolute: 0 10*3/uL (ref 0.0–0.1)
Basophils Relative: 1 %
Eosinophils Absolute: 0.2 10*3/uL (ref 0.0–0.5)
Eosinophils Relative: 5 %
HCT: 32 % — ABNORMAL LOW (ref 36.0–46.0)
Hemoglobin: 10.7 g/dL — ABNORMAL LOW (ref 12.0–15.0)
Immature Granulocytes: 0 %
Lymphocytes Relative: 35 %
Lymphs Abs: 1.8 10*3/uL (ref 0.7–4.0)
MCH: 29.2 pg (ref 26.0–34.0)
MCHC: 33.4 g/dL (ref 30.0–36.0)
MCV: 87.2 fL (ref 80.0–100.0)
Monocytes Absolute: 0.5 10*3/uL (ref 0.1–1.0)
Monocytes Relative: 9 %
Neutro Abs: 2.6 10*3/uL (ref 1.7–7.7)
Neutrophils Relative %: 50 %
Platelet Count: 216 10*3/uL (ref 150–400)
RBC: 3.67 MIL/uL — ABNORMAL LOW (ref 3.87–5.11)
RDW: 15 % (ref 11.5–15.5)
WBC Count: 5.1 10*3/uL (ref 4.0–10.5)
nRBC: 0 % (ref 0.0–0.2)

## 2024-02-08 LAB — CMP (CANCER CENTER ONLY)
ALT: 20 U/L (ref 0–44)
AST: 23 U/L (ref 15–41)
Albumin: 3.9 g/dL (ref 3.5–5.0)
Alkaline Phosphatase: 57 U/L (ref 38–126)
Anion gap: 8 (ref 5–15)
BUN: 20 mg/dL (ref 8–23)
CO2: 24 mmol/L (ref 22–32)
Calcium: 9 mg/dL (ref 8.9–10.3)
Chloride: 102 mmol/L (ref 98–111)
Creatinine: 1.09 mg/dL — ABNORMAL HIGH (ref 0.44–1.00)
GFR, Estimated: 54 mL/min — ABNORMAL LOW (ref >60)
Glucose, Bld: 176 mg/dL — ABNORMAL HIGH (ref 70–99)
Potassium: 3.8 mmol/L (ref 3.5–5.1)
Sodium: 134 mmol/L — ABNORMAL LOW (ref 135–145)
Total Bilirubin: 0.8 mg/dL (ref 0.0–1.2)
Total Protein: 6.9 g/dL (ref 6.5–8.1)

## 2024-02-08 LAB — RETIC PANEL
Immature Retic Fract: 10.3 % (ref 2.3–15.9)
RBC.: 3.7 MIL/uL — ABNORMAL LOW (ref 3.87–5.11)
Retic Count, Absolute: 62.9 10*3/uL (ref 19.0–186.0)
Retic Ct Pct: 1.7 % (ref 0.4–3.1)
Reticulocyte Hemoglobin: 32.5 pg (ref >27.9)

## 2024-02-08 LAB — FERRITIN: Ferritin: 23 ng/mL (ref 11–307)

## 2024-02-08 MED ORDER — BOSUTINIB 400 MG PO TABS
400.0000 mg | ORAL_TABLET | Freq: Every day | ORAL | 2 refills | Status: DC
Start: 2024-02-08 — End: 2024-04-11

## 2024-02-08 NOTE — Assessment & Plan Note (Signed)
 Slightly decreased Hb  Monitor. Lab Results  Component Value Date   HGB 10.7 (L) 02/08/2024   TIBC 396 02/08/2024   IRONPCTSAT 18 02/08/2024   FERRITIN 23 02/08/2024

## 2024-02-08 NOTE — Assessment & Plan Note (Addendum)
?   Bosulif  Encourage oral hydration and avoid nephrotoxins.

## 2024-02-08 NOTE — Assessment & Plan Note (Signed)
 Chronic myelogenous leukemia-low risk disease [Sokal, has 4, ELTS] March 2025 BCR ABL QT PCR b2a2 transcript 0.0429, lost MR4.5, after stopping Gleevec  for 6 months.  12/07/2023, started on bosutinib 400 mg daily. Overall she tolerates very well. Continue current regimen.

## 2024-02-08 NOTE — Progress Notes (Signed)
 Hematology/Oncology Progress note [phone] [phone]     Chief Complaint: Jenna Stein is a 73 y.o.SABRA female with chronic phase CML who is seen for management of CML  ASSESSMENT & PLAN:   BCR/ABL1-positive chronic myeloid leukemia (CML) in remission (HCC) Chronic myelogenous leukemia-low risk disease [Sokal, has 4, ELTS] March 2025 BCR ABL QT PCR b2a2 transcript 0.0429, lost MR4.5, after stopping Gleevec  for 6 months.  12/07/2023, started on bosutinib 400 mg daily. Overall she tolerates very well. Continue current regimen.   Anemia due to antineoplastic chemotherapy Slightly decreased Hb  Monitor. Lab Results  Component Value Date   HGB 10.7 (L) 02/08/2024   TIBC 396 02/08/2024   IRONPCTSAT 18 02/08/2024   FERRITIN 23 02/08/2024      Elevated serum creatinine ? Bosulif  Encourage oral hydration and avoid nephrotoxins.     Orders Placed This Encounter  Procedures   Iron and TIBC    Standing Status:   Future    Number of Occurrences:   1    Expected Date:   02/08/2024    Expiration Date:   05/08/2024   Ferritin    Standing Status:   Future    Number of Occurrences:   1    Expected Date:   02/08/2024    Expiration Date:   05/08/2024   CBC with Differential (Cancer Center Only)    Standing Status:   Future    Expected Date:   04/09/2024    Expiration Date:   07/08/2024   CMP (Cancer Center only)    Standing Status:   Future    Expected Date:   04/09/2024    Expiration Date:   07/08/2024   BCR-ABL1 FISH    Standing Status:   Future    Expected Date:   04/09/2024    Expiration Date:   07/08/2024   BCR-ABL1, CML/ALL, PCR, QUANT    Standing Status:   Future    Expected Date:   04/09/2024    Expiration Date:   07/08/2024   Retic Panel    Standing Status:   Future    Number of Occurrences:   1    Expected Date:   02/08/2024    Expiration Date:   05/08/2024   Follow-up 8 weeks all questions were answered. The patient knows to call the clinic  with any problems, questions or concerns. We spent sufficient time to discuss many aspect of care, questions were answered to patient's satisfaction.  Zelphia Cap, MD, PhD [facility] Hematology Oncology 02/08/2024    .  PERTINENT ONCOLOGY HISTORY Li Wei Essex is a 73 y.o.afemale who has above oncology history reviewed by me today presented for follow up visit for management of CML Patient previously followed up by Dr.Corcoran, patient switched care to me on 01/03/2021 Extensive medical record review was performed by me  10/09/2020 abnormal peripheral smear (3% myelocytes) 10/25/2020  lab work-up revealed a hematocrit of 40.8, hemoglobin 13.7, platelets 352,000, WBC 10,200. LDH was 174.  BCR-ABL showed 70% of nuclei positive for BCR/ABL1 fusion.  Flow cytometry revealed left shifted aberrant neutrophils, aberrant monocytes and absolute basophilia.   BCR-ABL1 revealed 123.6615% b2a2, < 0.0032% b3a2, and < 0.0032% E1A2 transcript. The ABL1 e13a2 (b2a2, p210) fusion transcript was positive.   11/05/2020 bone marrow biopsy showed  slightly hypercellular marrow with mild granulocytic proliferation and atypical megakaryocytes. The combined bone marrow and peripheral blood findings were very limited but were generally c/w early involvement by chronic myeloid leukemia, chronic phase especially  in the presence of previously documented positivity for BCR/ABL1 fusion in peripheral blood analysis.  Cytogenetics revealed 97, XX, t(9;22)(q34.1; q11.2)[19] / 4, XX [1].  Molecular genetics showed a BCR-ABL1 translocation t(9;22) with 65.5684 % BCR-ABL1/ABL1 (IS), major breakpoint p210, log reduction 0.001.  11/16/2020 abdominal ultrasound showed no splenomegaly (7.2 x 9.5 x 4.4 cm).   Relative risk (RR) Sokal 0.81 (intermediate) and Hasford 684.83 (low) based on age (27), spleen (0), platelets (309), basophils (6%), eosinophils (3%), and myeloblasts (occ-1%). ELTS risk score 1.5596 (low risk) based on 0% blasts and  1.6648 (intermediate-risk) based on 1% blasts.  11/28/2020 -November 2025 Gleevec  400 mg daily.  Stopped Gleevec  after molecular remission for 2 years.  March 2025, lost molecular remission  INTERVAL HISTORY Tzipora Mcinroy is a 73 y.o. female who has above history reviewed by me today presents for follow up visit for management of CML 12/07/2023 started on bosutinib 400 mg daily. Patient reports feeling well.  Denies any side effects. She returns from her trip to [place]. Doing well.      Past Medical History:  Diagnosis Date   Allergy    Anemia due to antineoplastic chemotherapy 05/06/2022   Anxiety    Arthritis 2010   Chronic myeloid leukemia, BCR/ABL-positive, not having achieved remission (HCC) 11/17/2020   Depression    Diabetes mellitus without complication (HCC)    GERD (gastroesophageal reflux disease)    Hyperlipidemia    Hypertension     Past Surgical History:  Procedure Laterality Date   CYST EXCISION Left 2017   foot   EYE SURGERY  11/16/1996   myopia laser surgery   HAND SURGERY  07/2012   MASS EXCISION Right 08/13/2021   Procedure: EXCISION LOWER LIP MASS;  Surgeon: Blair Mt, MD;  Location: [facility];  Service: ENT;  Laterality: Right;  Diabetic    Family History  Problem Relation Age of Onset   Hypertension Mother    Hyperlipidemia Mother    Arthritis Mother    Parkinsonism Father    Arthritis Other    Hypertension Other    Hyperlipidemia Other    Hypothyroidism Other    Breast cancer Sister 44   Cancer Sister     Social History:  reports that she has never smoked. She has never used smokeless tobacco. She reports that she does not drink alcohol and does not use drugs.   Allergies:  Allergies  Allergen Reactions   Dapagliflozin  Other (See Comments)   Clonazepam Other (See Comments)    Nocturnal enuresis; Lethargy    Trazodone Anxiety and Other (See Comments)    Dizziness     Current Medications: Current Outpatient Medications   Medication Sig Dispense Refill   acetaminophen  (TYLENOL ) 500 MG tablet Take 2 tablets (1,000 mg total) by mouth in the morning and at bedtime. 180 tablet 0   acyclovir  (ZOVIRAX ) 200 MG capsule Take 1 capsule (200 mg total) by mouth 5 (five) times daily. 90 capsule 1   amLODipine  (NORVASC ) 2.5 MG tablet Take 1 tablet (2.5 mg total) by mouth daily. 90 tablet 1   atenolol  (TENORMIN ) 50 MG tablet Take 1 tablet (50 mg total) by mouth daily. 90 tablet 1   atorvastatin  (LIPITOR) 10 MG tablet Take 1 tablet (10 mg total) by mouth at bedtime. 90 tablet 1   Coenzyme Q10 (CO Q 10) 100 MG CAPS Take 2 capsules by mouth daily.     estazolam  (PROSOM ) 2 MG tablet TAKE 1 TABLET BY MOUTH EVERYDAY AT BEDTIME 30  tablet 5   glucose blood (FREESTYLE LITE) test strip Use to test BS twice daily 100 each 4   hydrochlorothiazide  (HYDRODIURIL ) 25 MG tablet Take 1 tablet (25 mg total) by mouth daily. 90 tablet 3   hydrocortisone  cream 0.5 % Apply 1 application topically 2 (two) times daily. Apply to periorbital area twice daily. 28 g 1   Lancets (FREESTYLE) lancets Use to test BS once daily 100 each 12   loperamide  (IMODIUM ) 2 MG capsule Take 1 capsule (2 mg total) by mouth See admin instructions. Initial: 4 mg,the 2 mg every 2 hours (4 mg every 4 hours at night) until diarrhea stops.maximum: 16 mg/day 60 capsule 2   Lutein 20 MG TABS Take 1 tablet by mouth daily.      metFORMIN  (GLUCOPHAGE ) 1000 MG tablet Take 1 tablet (1,000 mg total) by mouth 2 (two) times daily with a meal. 180 tablet 3   MULTIPLE VITAMIN PO 1 tablet daily.      olopatadine (PATANOL) 0.1 % ophthalmic solution Apply to eye.     Omega-3 Fatty Acids (FISH OIL PEARLS PO) Take by mouth.     ondansetron  (ZOFRAN ) 4 MG tablet Take 1 tablet (4 mg total) by mouth every 8 (eight) hours as needed for nausea or vomiting. 20 tablet 0   PARoxetine  (PAXIL ) 20 MG tablet Take 1 tablet (20 mg total) by mouth daily. 90 tablet 3   potassium chloride  (KLOR-CON  M) 10 MEQ  tablet Take 1 tablet (10 mEq total) by mouth daily as needed (take on days you have diarrhea.). 90 tablet 1   prednisoLONE acetate (PRED FORTE) 1 % ophthalmic suspension Place 1 drop into the right eye 4 (four) times daily.     PREVIDENT 5000 PLUS 1.1 % CREA dental cream Place 1 Application onto teeth as directed.     SELENIUM PO Take by mouth daily.     sitaGLIPtin  (JANUVIA ) 100 MG tablet Take 1 tablet (100 mg total) by mouth daily. 90 tablet 3   triamcinolone  cream (KENALOG ) 0.5 % APPLY 1 APPLICATION TOPICALLY 3 (THREE) TIMES DAILY. TO RASH ON ABDOMEN 30 g 0   valsartan  (DIOVAN ) 320 MG tablet Take 1 tablet (320 mg total) by mouth daily. 90 tablet 3   Vitamin D , Ergocalciferol , (DRISDOL) 1.25 MG (50000 UNIT) CAPS capsule Take 50,000 Units by mouth every 7 (seven) days.     vitamin E  (VITAMIN E ) 400 UNIT capsule Take 1 capsule (400 Units total) by mouth daily. 1 capsule 0   zinc gluconate 50 MG tablet Take 50 mg by mouth daily.     bosutinib (BOSULIF ) 400 MG tablet Take 1 tablet (400 mg total) by mouth daily. Take with food. 30 tablet 2   No current facility-administered medications for this visit.    Review of Systems  Constitutional:  Negative for chills, fever, malaise/fatigue and weight loss.  HENT:  Negative for sore throat.   Eyes:  Negative for redness.  Respiratory:  Negative for cough, shortness of breath and wheezing.   Cardiovascular:  Negative for chest pain, palpitations and leg swelling.  Gastrointestinal:  Negative for abdominal pain, blood in stool, nausea and vomiting.  Genitourinary:  Negative for dysuria.  Musculoskeletal:  Positive for joint pain.  Skin:  Negative for rash.  Neurological:  Negative for dizziness, tingling and tremors.  Endo/Heme/Allergies:  Does not bruise/bleed easily.  Psychiatric/Behavioral:  Negative for hallucinations.    Performance status (ECOG): 0 Vitals Blood pressure (!) 114/59, pulse 67, temperature 97.6 F (36.4 C),  resp. rate 18,  weight 145 lb 8 oz (66 kg), SpO2 98%.  Physical Exam Constitutional:      General: She is not in acute distress.    Appearance: She is not diaphoretic.  HENT:     Head: Normocephalic and atraumatic.   Eyes:     General: No scleral icterus.   Cardiovascular:     Rate and Rhythm: Normal rate.  Pulmonary:     Effort: Pulmonary effort is normal. No respiratory distress.  Abdominal:     General: There is no distension.   Musculoskeletal:        General: Normal range of motion.     Cervical back: Normal range of motion and neck supple.   Skin:    Findings: No erythema.   Neurological:     Mental Status: She is alert and oriented to person, place, and time. Mental status is at baseline.     Motor: No abnormal muscle tone.   Psychiatric:        Mood and Affect: Mood and affect normal.

## 2024-02-12 LAB — BCR-ABL1 FISH
Cells Analyzed: 200
Cells Counted: 200

## 2024-02-12 LAB — BCR-ABL1, CML/ALL, PCR, QUANT
E1A2 Transcript: <0.0032 %
Interpretation (BCRAL):: NEGATIVE
b2a2 transcript: <0.0032 %
b3a2 transcript: <0.0032 %

## 2024-03-25 ENCOUNTER — Encounter: Payer: Self-pay | Admitting: Internal Medicine

## 2024-03-28 ENCOUNTER — Encounter: Payer: Self-pay | Admitting: Internal Medicine

## 2024-03-28 ENCOUNTER — Ambulatory Visit (INDEPENDENT_AMBULATORY_CARE_PROVIDER_SITE_OTHER): Admitting: Internal Medicine

## 2024-03-28 VITALS — BP 108/74 | HR 68 | Ht 64.0 in | Wt 140.0 lb

## 2024-03-28 DIAGNOSIS — I10 Essential (primary) hypertension: Secondary | ICD-10-CM | POA: Diagnosis not present

## 2024-03-28 DIAGNOSIS — H938X2 Other specified disorders of left ear: Secondary | ICD-10-CM | POA: Diagnosis not present

## 2024-03-28 DIAGNOSIS — E118 Type 2 diabetes mellitus with unspecified complications: Secondary | ICD-10-CM

## 2024-03-28 DIAGNOSIS — Z7984 Long term (current) use of oral hypoglycemic drugs: Secondary | ICD-10-CM

## 2024-03-28 LAB — POCT GLYCOSYLATED HEMOGLOBIN (HGB A1C): Hemoglobin A1C: 5.6 % (ref 4.0–5.6)

## 2024-03-28 MED ORDER — AMLODIPINE BESYLATE 2.5 MG PO TABS: 2.5000 mg | ORAL_TABLET | Freq: Every day | ORAL | 1 refills | Status: DC

## 2024-03-28 MED ORDER — ATENOLOL 50 MG PO TABS: 50.0000 mg | ORAL_TABLET | Freq: Every day | ORAL | 1 refills | Status: DC

## 2024-03-28 MED ORDER — ONDANSETRON HCL 4 MG PO TABS: 4.0000 mg | ORAL_TABLET | Freq: Three times a day (TID) | ORAL | 0 refills | Status: DC | PRN

## 2024-03-28 NOTE — Progress Notes (Signed)
 Date:  03/28/2024   Name:  Jenna Stein   DOB:  12/21/1950   MRN:  [id number]   Chief Complaint: Diabetes and Hypertension  Diabetes She presents for her follow-up diabetic visit. She has type 2 diabetes mellitus. Her disease course has been stable. Pertinent negatives for hypoglycemia include no headaches or tremors. Pertinent negatives for diabetes include no chest pain, no fatigue, no polydipsia and no polyuria. Current diabetic treatments: Januvia  and MTF.  Hypertension This is a chronic problem. The problem is controlled. Pertinent negatives include no chest pain, headaches, palpitations or shortness of breath. Past treatments include angiotensin blockers, calcium  channel blockers, beta blockers and diuretics.    Review of Systems  Constitutional:  Negative for appetite change, fatigue, fever and unexpected weight change.  HENT:  Positive for ear pain (feels a lump in left ear canal). Negative for tinnitus and trouble swallowing.   Eyes:  Negative for visual disturbance.  Respiratory:  Negative for cough, chest tightness and shortness of breath.   Cardiovascular:  Negative for chest pain, palpitations and leg swelling.  Gastrointestinal:  Negative for abdominal pain.  Endocrine: Negative for polydipsia and polyuria.  Genitourinary:  Negative for dysuria and hematuria.  Musculoskeletal:  Negative for arthralgias.  Neurological:  Negative for tremors, numbness and headaches.  Psychiatric/Behavioral:  Negative for dysphoric mood.      Lab Results  Component Value Date   NA 134 (L) 02/08/2024   K 3.8 02/08/2024   CO2 24 02/08/2024   GLUCOSE 176 (H) 02/08/2024   BUN 20 02/08/2024   CREATININE 1.09 (H) 02/08/2024   CALCIUM  9.0 02/08/2024   EGFR 59 (L) 11/03/2022   GFRNONAA 54 (L) 02/08/2024   Lab Results  Component Value Date   CHOL 147 11/11/2023   HDL 48 11/11/2023   LDLCALC 76 11/11/2023   TRIG 130 11/11/2023   CHOLHDL 3.1 11/11/2023   Lab Results  Component  Value Date   TSH 2.850 11/11/2023   Lab Results  Component Value Date   HGBA1C 6.4 (H) 11/11/2023   Lab Results  Component Value Date   WBC 5.1 02/08/2024   HGB 10.7 (L) 02/08/2024   HCT 32.0 (L) 02/08/2024   MCV 87.2 02/08/2024   PLT 216 02/08/2024   Lab Results  Component Value Date   ALT 20 02/08/2024   AST 23 02/08/2024   ALKPHOS 57 02/08/2024   BILITOT 0.8 02/08/2024   Lab Results  Component Value Date   VD25OH 56.3 11/11/2023     Patient Active Problem List   Diagnosis Date Noted   Carpal tunnel syndrome of right wrist 11/11/2023   Vitamin D  deficiency 11/11/2023   Elevated serum creatinine 12/02/2022   Trigger thumb of left hand 11/06/2022   Encounter for antineoplastic chemotherapy 05/06/2022   Periorbital edema of both eyes 05/06/2022   Chemotherapy induced diarrhea 05/06/2022   Anemia due to antineoplastic chemotherapy 05/06/2022   Hypokalemia 05/06/2022   Chemotherapy-induced neutropenia (HCC) 10/24/2021   Gastroesophageal reflux disease with esophagitis 10/24/2021   BCR/ABL1-positive chronic myeloid leukemia (CML) in remission (HCC) 11/17/2020   Abnormal blood smear 10/25/2020   Type II diabetes mellitus with complication (HCC) 02/10/2017   Dry mouth 10/10/2016   Allergic rhinitis, seasonal 04/30/2015   Hepatic steatosis 04/30/2015   Osteoarthritis of both hands 04/30/2015   Genital herpes 04/04/2009   Depression, major, single episode, in partial remission (HCC) 08/18/1998   Essential hypertension 08/18/1998   Hyperlipidemia associated with type 2 diabetes mellitus (HCC) 08/18/1998  Primary insomnia 08/18/1998    Allergies  Allergen Reactions   Dapagliflozin  Other (See Comments)   Clonazepam Other (See Comments)    Nocturnal enuresis; Lethargy    Trazodone Anxiety and Other (See Comments)    Dizziness     Past Surgical History:  Procedure Laterality Date   CYST EXCISION Left 2017   foot   EYE SURGERY  11/16/1996   myopia laser  surgery   HAND SURGERY  07/2012   MASS EXCISION Right 08/13/2021   Procedure: EXCISION LOWER LIP MASS;  Surgeon: Blair Mt, MD;  Location: [facility];  Service: ENT;  Laterality: Right;  Diabetic    Social History   Tobacco Use   Smoking status: Never   Smokeless tobacco: Never   Tobacco comments:    smoking cessation materials not required  Vaping Use   Vaping status: Never Used  Substance Use Topics   Alcohol use: No   Drug use: No     Medication list has been reviewed and updated.  Current Meds  Medication Sig   acetaminophen  (TYLENOL ) 500 MG tablet Take 2 tablets (1,000 mg total) by mouth in the morning and at bedtime.   acyclovir  (ZOVIRAX ) 200 MG capsule Take 1 capsule (200 mg total) by mouth 5 (five) times daily.   atorvastatin  (LIPITOR) 10 MG tablet Take 1 tablet (10 mg total) by mouth at bedtime.   bosutinib (BOSULIF ) 400 MG tablet Take 1 tablet (400 mg total) by mouth daily. Take with food.   Coenzyme Q10 (CO Q 10) 100 MG CAPS Take 2 capsules by mouth daily.   estazolam  (PROSOM ) 2 MG tablet TAKE 1 TABLET BY MOUTH EVERYDAY AT BEDTIME   glucose blood (FREESTYLE LITE) test strip Use to test BS twice daily   hydrochlorothiazide  (HYDRODIURIL ) 25 MG tablet Take 1 tablet (25 mg total) by mouth daily.   hydrocortisone  cream 0.5 % Apply 1 application topically 2 (two) times daily. Apply to periorbital area twice daily.   Lancets (FREESTYLE) lancets Use to test BS once daily   loperamide  (IMODIUM ) 2 MG capsule Take 1 capsule (2 mg total) by mouth See admin instructions. Initial: 4 mg,the 2 mg every 2 hours (4 mg every 4 hours at night) until diarrhea stops.maximum: 16 mg/day   Lutein 20 MG TABS Take 1 tablet by mouth daily.    metFORMIN  (GLUCOPHAGE ) 1000 MG tablet Take 1 tablet (1,000 mg total) by mouth 2 (two) times daily with a meal.   MULTIPLE VITAMIN PO 1 tablet daily.    olopatadine (PATANOL) 0.1 % ophthalmic solution Apply to eye.   Omega-3 Fatty Acids (FISH  OIL PEARLS PO) Take by mouth.   PARoxetine  (PAXIL ) 20 MG tablet Take 1 tablet (20 mg total) by mouth daily.   potassium chloride  (KLOR-CON  M) 10 MEQ tablet Take 1 tablet (10 mEq total) by mouth daily as needed (take on days you have diarrhea.).   prednisoLONE acetate (PRED FORTE) 1 % ophthalmic suspension Place 1 drop into the right eye 4 (four) times daily.   PREVIDENT 5000 PLUS 1.1 % CREA dental cream Place 1 Application onto teeth as directed.   SELENIUM PO Take by mouth daily.   sitaGLIPtin  (JANUVIA ) 100 MG tablet Take 1 tablet (100 mg total) by mouth daily.   triamcinolone  cream (KENALOG ) 0.5 % APPLY 1 APPLICATION TOPICALLY 3 (THREE) TIMES DAILY. TO RASH ON ABDOMEN   valsartan  (DIOVAN ) 320 MG tablet Take 1 tablet (320 mg total) by mouth daily.   Vitamin D , Ergocalciferol , (DRISDOL)  1.25 MG (50000 UNIT) CAPS capsule Take 50,000 Units by mouth every 7 (seven) days.   vitamin E  (VITAMIN E ) 400 UNIT capsule Take 1 capsule (400 Units total) by mouth daily.   zinc gluconate 50 MG tablet Take 50 mg by mouth daily.   [DISCONTINUED] amLODipine  (NORVASC ) 2.5 MG tablet Take 1 tablet (2.5 mg total) by mouth daily.   [DISCONTINUED] atenolol  (TENORMIN ) 50 MG tablet Take 1 tablet (50 mg total) by mouth daily.   [DISCONTINUED] ondansetron  (ZOFRAN ) 4 MG tablet Take 1 tablet (4 mg total) by mouth every 8 (eight) hours as needed for nausea or vomiting.       03/28/2024    2:13 PM 06/15/2023    1:26 PM 08/19/2022    3:20 PM 07/08/2022    2:26 PM  GAD 7 : Generalized Anxiety Score  Nervous, Anxious, on Edge 0 0 0 1  Control/stop worrying 0 0 0 0  Worry too much - different things 0 0 0 2  Trouble relaxing 0 0 0 0  Restless 0 0 0 0  Easily annoyed or irritable 0 0 0 0  Afraid - awful might happen 0 0 0 0  Total GAD 7 Score 0 0 0 3  Anxiety Difficulty Not difficult at all Not difficult at all Not difficult at all Not difficult at all       03/28/2024    2:13 PM 09/24/2023   10:09 AM 06/15/2023    1:26  PM  Depression screen PHQ 2/9  Decreased Interest 0 0 0  Down, Depressed, Hopeless 0 0 0  PHQ - 2 Score 0 0 0  Altered sleeping 0  0  Tired, decreased energy 0  0  Change in appetite 0  0  Feeling bad or failure about yourself  0  0  Trouble concentrating 0  0  Moving slowly or fidgety/restless 0  0  Suicidal thoughts 0  0  PHQ-9 Score 0  0  Difficult doing work/chores Not difficult at all  Not difficult at all    BP Readings from Last 3 Encounters:  03/28/24 108/74  02/08/24 (!) 114/59  12/22/23 134/61    Physical Exam Vitals and nursing note reviewed.  Constitutional:      General: She is not in acute distress.    Appearance: Normal appearance. She is well-developed.  HENT:     Head: Normocephalic and atraumatic.     Right Ear: Tympanic membrane and ear canal normal.     Left Ear: Tympanic membrane and ear canal normal.     Ears:      Comments: Small raised flesh colored lesion - confluent with the skin of the external canal.  No redness, drainage or pain.    Nose:     Right Sinus: No maxillary sinus tenderness.     Left Sinus: No maxillary sinus tenderness.  Eyes:     General: No scleral icterus.       Right eye: No discharge.        Left eye: No discharge.     Conjunctiva/sclera: Conjunctivae normal.  Neck:     Thyroid : No thyromegaly.     Vascular: No carotid bruit.  Cardiovascular:     Rate and Rhythm: Normal rate and regular rhythm.     Pulses: Normal pulses.     Heart sounds: Normal heart sounds.  Pulmonary:     Effort: Pulmonary effort is normal. No respiratory distress.     Breath sounds: Normal breath sounds. No  decreased breath sounds or wheezing.  Abdominal:     General: Bowel sounds are normal.     Palpations: Abdomen is soft.     Tenderness: There is no abdominal tenderness.  Musculoskeletal:     Cervical back: Normal range of motion. No erythema.     Right lower leg: No edema.     Left lower leg: No edema.  Lymphadenopathy:     Cervical: No  cervical adenopathy.  Skin:    General: Skin is warm and dry.     Findings: No rash.  Neurological:     Mental Status: She is alert and oriented to person, place, and time.     Cranial Nerves: No cranial nerve deficit.     Sensory: No sensory deficit.     Deep Tendon Reflexes: Reflexes are normal and symmetric.  Psychiatric:        Attention and Perception: Attention normal.        Mood and Affect: Mood normal.     Wt Readings from Last 3 Encounters:  03/28/24 140 lb (63.5 kg)  02/08/24 145 lb 8 oz (66 kg)  12/22/23 144 lb 9.6 oz (65.6 kg)    BP 108/74   Pulse 68   Ht 5' 4 (1.626 m)   Wt 140 lb (63.5 kg)   SpO2 98%   BMI 24.03 kg/m   Assessment and Plan:  Problem List Items Addressed This Visit       Unprioritized   Essential hypertension - Primary (Chronic)   Blood pressure is well controlled on amlodipine , hydrochlorothiazide , Valsartan  and atenolol . No medication side effects noted. Plan to continue current medications.       Relevant Medications   atenolol  (TENORMIN ) 50 MG tablet   amLODipine  (NORVASC ) 2.5 MG tablet   Type II diabetes mellitus with complication (HCC) (Chronic)   Blood sugars have been stable.  No hypoglycemic events since last visit. Currently medications are MTF and Januvia . Last visit medical regimen changes were none. Lab Results  Component Value Date   HGBA1C 6.4 (H) 11/11/2023  A1c today = 5.6.  will continue the same medications.       Relevant Orders   POCT glycosylated hemoglobin (Hb A1C)   Other Visit Diagnoses       Long term current use of oral hypoglycemic drug         Mass of left ear canal       subcutaneous benign appearing lesion monitor for change       Return in about 4 months (around 07/28/2024) for DM, HTN.    Leita HILARIO Adie, MD [facility] [place]

## 2024-03-28 NOTE — Assessment & Plan Note (Signed)
 Blood pressure is well controlled on amlodipine , hydrochlorothiazide , Valsartan  and atenolol . No medication side effects noted. Plan to continue current medications.

## 2024-03-28 NOTE — Assessment & Plan Note (Addendum)
 Blood sugars have been stable.  No hypoglycemic events since last visit. Currently medications are MTF and Januvia . Last visit medical regimen changes were none. Lab Results  Component Value Date   HGBA1C 6.4 (H) 11/11/2023  A1c today = 5.6.  will continue the same medications.

## 2024-04-05 ENCOUNTER — Encounter: Payer: Self-pay | Admitting: Internal Medicine

## 2024-04-07 ENCOUNTER — Other Ambulatory Visit: Payer: Self-pay

## 2024-04-07 NOTE — Progress Notes (Signed)
 Patient received one time fill of Bosulif  in April 2025 with voucher then was transferred to [facility] pharmacy to continue to receive at no cost due to insurance restriction, disenrollment backdated.

## 2024-04-11 ENCOUNTER — Inpatient Hospital Stay (HOSPITAL_BASED_OUTPATIENT_CLINIC_OR_DEPARTMENT_OTHER): Admitting: Oncology

## 2024-04-11 ENCOUNTER — Encounter: Payer: Self-pay | Admitting: Oncology

## 2024-04-11 ENCOUNTER — Inpatient Hospital Stay: Attending: Oncology

## 2024-04-11 VITALS — BP 124/63 | HR 59 | Temp 97.7°F | Resp 16 | Wt 140.0 lb

## 2024-04-11 DIAGNOSIS — C9211 Chronic myeloid leukemia, BCR/ABL-positive, in remission: Secondary | ICD-10-CM

## 2024-04-11 DIAGNOSIS — K521 Toxic gastroenteritis and colitis: Secondary | ICD-10-CM

## 2024-04-11 DIAGNOSIS — Z79899 Other long term (current) drug therapy: Secondary | ICD-10-CM | POA: Diagnosis not present

## 2024-04-11 DIAGNOSIS — D6481 Anemia due to antineoplastic chemotherapy: Secondary | ICD-10-CM

## 2024-04-11 DIAGNOSIS — R7989 Other specified abnormal findings of blood chemistry: Secondary | ICD-10-CM

## 2024-04-11 DIAGNOSIS — T451X5A Adverse effect of antineoplastic and immunosuppressive drugs, initial encounter: Secondary | ICD-10-CM

## 2024-04-11 DIAGNOSIS — Z5111 Encounter for antineoplastic chemotherapy: Secondary | ICD-10-CM

## 2024-04-11 LAB — CMP (CANCER CENTER ONLY)
ALT: 19 U/L (ref 0–44)
AST: 24 U/L (ref 15–41)
Albumin: 4.4 g/dL (ref 3.5–5.0)
Alkaline Phosphatase: 61 U/L (ref 38–126)
Anion gap: 10 (ref 5–15)
BUN: 18 mg/dL (ref 8–23)
CO2: 23 mmol/L (ref 22–32)
Calcium: 9.4 mg/dL (ref 8.9–10.3)
Chloride: 100 mmol/L (ref 98–111)
Creatinine: 1.2 mg/dL — ABNORMAL HIGH (ref 0.44–1.00)
GFR, Estimated: 48 mL/min — ABNORMAL LOW (ref >60)
Glucose, Bld: 112 mg/dL — ABNORMAL HIGH (ref 70–99)
Potassium: 3.7 mmol/L (ref 3.5–5.1)
Sodium: 133 mmol/L — ABNORMAL LOW (ref 135–145)
Total Bilirubin: 0.9 mg/dL (ref 0.0–1.2)
Total Protein: 7.5 g/dL (ref 6.5–8.1)

## 2024-04-11 LAB — CBC WITH DIFFERENTIAL (CANCER CENTER ONLY)
Abs Immature Granulocytes: 0.03 K/uL (ref 0.00–0.07)
Basophils Absolute: 0.1 K/uL (ref 0.0–0.1)
Basophils Relative: 1 %
Eosinophils Absolute: 0.3 K/uL (ref 0.0–0.5)
Eosinophils Relative: 4 %
HCT: 33.7 % — ABNORMAL LOW (ref 36.0–46.0)
Hemoglobin: 11.6 g/dL — ABNORMAL LOW (ref 12.0–15.0)
Immature Granulocytes: 0 %
Lymphocytes Relative: 37 %
Lymphs Abs: 2.6 K/uL (ref 0.7–4.0)
MCH: 30.3 pg (ref 26.0–34.0)
MCHC: 34.4 g/dL (ref 30.0–36.0)
MCV: 88 fL (ref 80.0–100.0)
Monocytes Absolute: 0.6 K/uL (ref 0.1–1.0)
Monocytes Relative: 9 %
Neutro Abs: 3.3 K/uL (ref 1.7–7.7)
Neutrophils Relative %: 49 %
Platelet Count: 250 K/uL (ref 150–400)
RBC: 3.83 MIL/uL — ABNORMAL LOW (ref 3.87–5.11)
RDW: 13.1 % (ref 11.5–15.5)
WBC Count: 6.9 K/uL (ref 4.0–10.5)
nRBC: 0 % (ref 0.0–0.2)

## 2024-04-11 MED ORDER — BOSUTINIB 100 MG PO TABS: 300.0000 mg | ORAL_TABLET | Freq: Every day | ORAL | 3 refills | Status: DC

## 2024-04-11 NOTE — Assessment & Plan Note (Addendum)
 Chronic myelogenous leukemia-low risk disease [Sokal, has 4, ELTS] March 2025 BCR ABL QT PCR b2a2 transcript 0.0429, lost MR4.5, after stopping Gleevec  for 6 months.  12/07/2023, started on bosutinib 400 mg daily. 02/08/2024  MR 4.5 04/11/2024  Cr is worse. Recommend to decrease Bosutinib to 300mg  daily.

## 2024-04-11 NOTE — Assessment & Plan Note (Signed)
?   Bosulif  side effects.  Encourage oral hydration and avoid nephrotoxins.

## 2024-04-11 NOTE — Assessment & Plan Note (Signed)
 Slightly decreased Hb  Monitor. Lab Results  Component Value Date   HGB 11.6 (L) 04/11/2024   TIBC 396 02/08/2024   IRONPCTSAT 18 02/08/2024   FERRITIN 23 02/08/2024

## 2024-04-11 NOTE — Assessment & Plan Note (Signed)
symptoms are stable.  Continue as needed Imodium PRN as instructed, prescriptions were reviewed.    

## 2024-04-11 NOTE — Assessment & Plan Note (Signed)
Treatment as planned above. 

## 2024-04-11 NOTE — Progress Notes (Signed)
 Hematology/Oncology Progress note [phone] [phone]     Chief Complaint: Jenna Stein is a 73 y.o.SABRA female with chronic phase CML who is seen for management of CML  ASSESSMENT & PLAN:   BCR/ABL1-positive chronic myeloid leukemia (CML) in remission (HCC) Chronic myelogenous leukemia-low risk disease [Sokal, has 4, ELTS] March 2025 BCR ABL QT PCR b2a2 transcript 0.0429, lost MR4.5, after stopping Gleevec  for 6 months.  12/07/2023, started on bosutinib 400 mg daily. 02/08/2024  MR 4.5 04/11/2024  Cr is worse. Recommend to decrease Bosutinib to 300mg  daily.    Anemia due to antineoplastic chemotherapy Slightly decreased Hb  Monitor. Lab Results  Component Value Date   HGB 11.6 (L) 04/11/2024   TIBC 396 02/08/2024   IRONPCTSAT 18 02/08/2024   FERRITIN 23 02/08/2024      Chemotherapy induced diarrhea symptoms are stable.  Continue as needed Imodium  PRN as instructed, prescriptions were reviewed.     Elevated serum creatinine ? Bosulif  side effects.  Encourage oral hydration and avoid nephrotoxins.    Encounter for antineoplastic chemotherapy Treatment as planned above   Orders Placed This Encounter  Procedures   BCR-ABL1, CML/ALL, PCR, QUANT    Standing Status:   Future    Expected Date:   06/11/2024    Expiration Date:   09/09/2024   BCR-ABL1 FISH    Standing Status:   Future    Expected Date:   06/11/2024    Expiration Date:   09/09/2024   CBC with Differential (Cancer Center Only)    Standing Status:   Future    Expected Date:   06/11/2024    Expiration Date:   09/09/2024   CMP (Cancer Center only)    Standing Status:   Future    Expected Date:   06/11/2024    Expiration Date:   09/09/2024   Follow-up 8 weeks all questions were answered. The patient knows to call the clinic with any problems, questions or concerns. We spent sufficient time to discuss many aspect of care, questions were answered to patient's satisfaction.  Zelphia Cap, MD,  PhD [facility] Hematology Oncology 04/11/2024    .  PERTINENT ONCOLOGY HISTORY Jenna Stein is a 73 y.o.afemale who has above oncology history reviewed by me today presented for follow up visit for management of CML Patient previously followed up by Dr.Corcoran, patient switched care to me on 01/03/2021 Extensive medical record review was performed by me  10/09/2020 abnormal peripheral smear (3% myelocytes) 10/25/2020  lab work-up revealed a hematocrit of 40.8, hemoglobin 13.7, platelets 352,000, WBC 10,200. LDH was 174.  BCR-ABL showed 70% of nuclei positive for BCR/ABL1 fusion.  Flow cytometry revealed left shifted aberrant neutrophils, aberrant monocytes and absolute basophilia.   BCR-ABL1 revealed 123.6615% b2a2, < 0.0032% b3a2, and < 0.0032% E1A2 transcript. The ABL1 e13a2 (b2a2, p210) fusion transcript was positive.   11/05/2020 bone marrow biopsy showed  slightly hypercellular marrow with mild granulocytic proliferation and atypical megakaryocytes. The combined bone marrow and peripheral blood findings were very limited but were generally c/w early involvement by chronic myeloid leukemia, chronic phase especially in the presence of previously documented positivity for BCR/ABL1 fusion in peripheral blood analysis.  Cytogenetics revealed 51, XX, t(9;22)(q34.1; q11.2)[19] / 33, XX [1].  Molecular genetics showed a BCR-ABL1 translocation t(9;22) with 65.5684 % BCR-ABL1/ABL1 (IS), major breakpoint p210, log reduction 0.001.  11/16/2020 abdominal ultrasound showed no splenomegaly (7.2 x 9.5 x 4.4 cm).   Relative risk (RR) Sokal 0.81 (intermediate) and Hasford 684.83 (low)  based on age (12), spleen (0), platelets (309), basophils (6%), eosinophils (3%), and myeloblasts (occ-1%). ELTS risk score 1.5596 (low risk) based on 0% blasts and 1.6648 (intermediate-risk) based on 1% blasts.  11/28/2020 -November 2025 Gleevec  400 mg daily.  Stopped Gleevec  after molecular remission for 2 years.  March  2025, lost molecular remission  INTERVAL HISTORY Audianna Stein is a 73 y.o. female who has above history reviewed by me today presents for follow up visit for management of CML 12/07/2023 started on bosutinib 400 mg daily. Patient reports feeling well.  Denies any side effects. + alternate constipation for 3-4 days and then diarrhea for 1 day, stopped after taking Imodium  2mg  x 1.       Past Medical History:  Diagnosis Date   Allergy    Anemia due to antineoplastic chemotherapy 05/06/2022   Anxiety    Arthritis 2010   Chronic myeloid leukemia, BCR/ABL-positive, not having achieved remission (HCC) 11/17/2020   Depression    Diabetes mellitus without complication (HCC)    GERD (gastroesophageal reflux disease)    Hyperlipidemia    Hypertension     Past Surgical History:  Procedure Laterality Date   CYST EXCISION Left 2017   foot   EYE SURGERY  11/16/1996   myopia laser surgery   HAND SURGERY  07/2012   MASS EXCISION Right 08/13/2021   Procedure: EXCISION LOWER LIP MASS;  Surgeon: Blair Mt, MD;  Location: [facility];  Service: ENT;  Laterality: Right;  Diabetic    Family History  Problem Relation Age of Onset   Hypertension Mother    Hyperlipidemia Mother    Arthritis Mother    Parkinsonism Father    Arthritis Other    Hypertension Other    Hyperlipidemia Other    Hypothyroidism Other    Breast cancer Sister 60   Cancer Sister     Social History:  reports that she has never smoked. She has never used smokeless tobacco. She reports that she does not drink alcohol and does not use drugs.   Allergies:  Allergies  Allergen Reactions   Dapagliflozin  Other (See Comments)   Clonazepam Other (See Comments)    Nocturnal enuresis; Lethargy    Trazodone Anxiety and Other (See Comments)    Dizziness     Current Medications: Current Outpatient Medications  Medication Sig Dispense Refill   acetaminophen  (TYLENOL ) 500 MG tablet Take 2 tablets (1,000 mg  total) by mouth in the morning and at bedtime. 180 tablet 0   acyclovir  (ZOVIRAX ) 200 MG capsule Take 1 capsule (200 mg total) by mouth 5 (five) times daily. 90 capsule 1   amLODipine  (NORVASC ) 2.5 MG tablet Take 1 tablet (2.5 mg total) by mouth daily. 90 tablet 1   atenolol  (TENORMIN ) 50 MG tablet Take 1 tablet (50 mg total) by mouth daily. 90 tablet 1   atorvastatin  (LIPITOR) 10 MG tablet Take 1 tablet (10 mg total) by mouth at bedtime. 90 tablet 1   bosutinib (BOSULIF ) 100 MG tablet Take 3 tablets (300 mg total) by mouth daily with breakfast. Take with food. 90 tablet 3   Coenzyme Q10 (CO Q 10) 100 MG CAPS Take 2 capsules by mouth daily.     estazolam  (PROSOM ) 2 MG tablet TAKE 1 TABLET BY MOUTH EVERYDAY AT BEDTIME 30 tablet 5   glucose blood (FREESTYLE LITE) test strip Use to test BS twice daily 100 each 4   hydrochlorothiazide  (HYDRODIURIL ) 25 MG tablet Take 1 tablet (25 mg total) by mouth  daily. 90 tablet 3   hydrocortisone  cream 0.5 % Apply 1 application topically 2 (two) times daily. Apply to periorbital area twice daily. 28 g 1   Lancets (FREESTYLE) lancets Use to test BS once daily 100 each 12   loperamide  (IMODIUM ) 2 MG capsule Take 1 capsule (2 mg total) by mouth See admin instructions. Initial: 4 mg,the 2 mg every 2 hours (4 mg every 4 hours at night) until diarrhea stops.maximum: 16 mg/day 60 capsule 2   Lutein 20 MG TABS Take 1 tablet by mouth daily.      metFORMIN  (GLUCOPHAGE ) 1000 MG tablet Take 1 tablet (1,000 mg total) by mouth 2 (two) times daily with a meal. 180 tablet 3   MULTIPLE VITAMIN PO 1 tablet daily.      olopatadine (PATANOL) 0.1 % ophthalmic solution Apply to eye.     Omega-3 Fatty Acids (FISH OIL PEARLS PO) Take by mouth.     ondansetron  (ZOFRAN ) 4 MG tablet Take 1 tablet (4 mg total) by mouth every 8 (eight) hours as needed for nausea or vomiting. 20 tablet 0   PARoxetine  (PAXIL ) 20 MG tablet Take 1 tablet (20 mg total) by mouth daily. 90 tablet 3   potassium  chloride (KLOR-CON  M) 10 MEQ tablet Take 1 tablet (10 mEq total) by mouth daily as needed (take on days you have diarrhea.). 90 tablet 1   prednisoLONE acetate (PRED FORTE) 1 % ophthalmic suspension Place 1 drop into the right eye 4 (four) times daily.     PREVIDENT 5000 PLUS 1.1 % CREA dental cream Place 1 Application onto teeth as directed.     SELENIUM PO Take by mouth daily.     sitaGLIPtin  (JANUVIA ) 100 MG tablet Take 1 tablet (100 mg total) by mouth daily. 90 tablet 3   triamcinolone  cream (KENALOG ) 0.5 % APPLY 1 APPLICATION TOPICALLY 3 (THREE) TIMES DAILY. TO RASH ON ABDOMEN 30 g 0   valsartan  (DIOVAN ) 320 MG tablet Take 1 tablet (320 mg total) by mouth daily. 90 tablet 3   Vitamin D , Ergocalciferol , (DRISDOL) 1.25 MG (50000 UNIT) CAPS capsule Take 50,000 Units by mouth every 7 (seven) days.     vitamin E  (VITAMIN E ) 400 UNIT capsule Take 1 capsule (400 Units total) by mouth daily. 1 capsule 0   zinc gluconate 50 MG tablet Take 50 mg by mouth daily.     No current facility-administered medications for this visit.    Review of Systems  Constitutional:  Negative for chills, fever, malaise/fatigue and weight loss.  HENT:  Negative for sore throat.   Eyes:  Negative for redness.  Respiratory:  Negative for cough, shortness of breath and wheezing.   Cardiovascular:  Negative for chest pain, palpitations and leg swelling.  Gastrointestinal:  Negative for abdominal pain, blood in stool, nausea and vomiting.  Genitourinary:  Negative for dysuria.  Musculoskeletal:  Positive for joint pain.  Skin:  Negative for rash.  Neurological:  Negative for dizziness, tingling and tremors.  Endo/Heme/Allergies:  Does not bruise/bleed easily.  Psychiatric/Behavioral:  Negative for hallucinations.    Performance status (ECOG): 0 Vitals Blood pressure 124/63, pulse (!) 59, temperature 97.7 F (36.5 C), temperature source Tympanic, resp. rate 16, weight 140 lb (63.5 kg), SpO2 100%.  Physical  Exam Constitutional:      General: She is not in acute distress.    Appearance: She is not diaphoretic.  HENT:     Head: Normocephalic and atraumatic.  Eyes:     General: No  scleral icterus. Cardiovascular:     Rate and Rhythm: Normal rate.  Pulmonary:     Effort: Pulmonary effort is normal. No respiratory distress.  Abdominal:     General: There is no distension.  Musculoskeletal:        General: Normal range of motion.     Cervical back: Normal range of motion and neck supple.  Skin:    Findings: No erythema.  Neurological:     Mental Status: She is alert and oriented to person, place, and time. Mental status is at baseline.     Motor: No abnormal muscle tone.  Psychiatric:        Mood and Affect: Mood and affect normal.

## 2024-04-14 LAB — BCR-ABL1, CML/ALL, PCR, QUANT
E1A2 Transcript: <0.0032 %
Interpretation (BCRAL):: NEGATIVE
b2a2 transcript: <0.0032 %
b3a2 transcript: <0.0032 %

## 2024-04-21 LAB — BCR-ABL1 FISH
Cells Analyzed: 200
Cells Counted: 200

## 2024-05-02 ENCOUNTER — Other Ambulatory Visit: Payer: Self-pay | Admitting: Internal Medicine

## 2024-05-02 DIAGNOSIS — I10 Essential (primary) hypertension: Secondary | ICD-10-CM

## 2024-05-03 NOTE — Telephone Encounter (Signed)
 Changed to local pharmacy Requested Prescriptions  Pending Prescriptions Disp Refills   hydrochlorothiazide  (HYDRODIURIL ) 25 MG tablet [Pharmacy Med Name: HYDROCHLOROTHIAZIDE  25 MG TAB] 90 tablet 1    Sig: TAKE 1 TABLET (25 MG TOTAL) BY MOUTH DAILY.     Cardiovascular: Diuretics - Thiazide Failed - 05/03/2024 12:14 PM      Failed - Cr in normal range and within 180 days    Creatinine  Date Value Ref Range Status  04/11/2024 1.20 (H) 0.44 - 1.00 mg/dL Final         Failed - Na in normal range and within 180 days    Sodium  Date Value Ref Range Status  04/11/2024 133 (L) 135 - 145 mmol/L Final  11/03/2022 143 134 - 144 mmol/L Final         Passed - K in normal range and within 180 days    Potassium  Date Value Ref Range Status  04/11/2024 3.7 3.5 - 5.1 mmol/L Final         Passed - Last BP in normal range    BP Readings from Last 1 Encounters:  04/11/24 124/63         Passed - Valid encounter within last 6 months    Recent Outpatient Visits           1 month ago Essential hypertension   [facility], Leita DEL, MD   5 months ago Essential hypertension   [facility], Leita DEL, MD

## 2024-06-13 ENCOUNTER — Other Ambulatory Visit

## 2024-06-13 ENCOUNTER — Encounter: Payer: Self-pay | Admitting: Oncology

## 2024-06-13 ENCOUNTER — Ambulatory Visit: Admitting: Oncology

## 2024-06-13 ENCOUNTER — Inpatient Hospital Stay: Admitting: Oncology

## 2024-06-13 ENCOUNTER — Inpatient Hospital Stay: Attending: Oncology

## 2024-06-13 VITALS — BP 128/60 | HR 60 | Temp 97.7°F | Resp 16 | Wt 143.3 lb

## 2024-06-13 DIAGNOSIS — C9211 Chronic myeloid leukemia, BCR/ABL-positive, in remission: Secondary | ICD-10-CM | POA: Diagnosis not present

## 2024-06-13 DIAGNOSIS — R21 Rash and other nonspecific skin eruption: Secondary | ICD-10-CM | POA: Insufficient documentation

## 2024-06-13 LAB — CBC WITH DIFFERENTIAL (CANCER CENTER ONLY)
Abs Immature Granulocytes: 0.02 K/uL (ref 0.00–0.07)
Basophils Absolute: 0.1 K/uL (ref 0.0–0.1)
Basophils Relative: 1 %
Eosinophils Absolute: 0.3 K/uL (ref 0.0–0.5)
Eosinophils Relative: 5 %
HCT: 35.6 % — ABNORMAL LOW (ref 36.0–46.0)
Hemoglobin: 12.1 g/dL (ref 12.0–15.0)
Immature Granulocytes: 0 %
Lymphocytes Relative: 34 %
Lymphs Abs: 2.2 K/uL (ref 0.7–4.0)
MCH: 29.7 pg (ref 26.0–34.0)
MCHC: 34 g/dL (ref 30.0–36.0)
MCV: 87.3 fL (ref 80.0–100.0)
Monocytes Absolute: 0.5 K/uL (ref 0.1–1.0)
Monocytes Relative: 8 %
Neutro Abs: 3.4 K/uL (ref 1.7–7.7)
Neutrophils Relative %: 52 %
Platelet Count: 265 K/uL (ref 150–400)
RBC: 4.08 MIL/uL (ref 3.87–5.11)
RDW: 13.1 % (ref 11.5–15.5)
WBC Count: 6.5 K/uL (ref 4.0–10.5)
nRBC: 0 % (ref 0.0–0.2)

## 2024-06-13 LAB — CMP (CANCER CENTER ONLY)
ALT: 19 U/L (ref 0–44)
AST: 25 U/L (ref 15–41)
Albumin: 4.4 g/dL (ref 3.5–5.0)
Alkaline Phosphatase: 55 U/L (ref 38–126)
Anion gap: 11 (ref 5–15)
BUN: 18 mg/dL (ref 8–23)
CO2: 23 mmol/L (ref 22–32)
Calcium: 9.4 mg/dL (ref 8.9–10.3)
Chloride: 98 mmol/L (ref 98–111)
Creatinine: 0.95 mg/dL (ref 0.44–1.00)
GFR, Estimated: >60 mL/min (ref >60)
Glucose, Bld: 128 mg/dL — ABNORMAL HIGH (ref 70–99)
Potassium: 3.6 mmol/L (ref 3.5–5.1)
Sodium: 132 mmol/L — ABNORMAL LOW (ref 135–145)
Total Bilirubin: 0.7 mg/dL (ref 0.0–1.2)
Total Protein: 7.3 g/dL (ref 6.5–8.1)

## 2024-06-13 MED ORDER — BOSUTINIB 100 MG PO TABS: 300.0000 mg | ORAL_TABLET | Freq: Every day | ORAL | 3 refills | Status: DC

## 2024-06-13 NOTE — Assessment & Plan Note (Signed)
 Intermittent rash and dryness on lips and forearms, possibly related to allergies or environmental factors. Symptoms not severe and intermittent. Topical treatments beneficial. - Apply steroid cream to affected areas as needed. - Continue using Aquacel and ceramic wrap for moisture retention.

## 2024-06-13 NOTE — Assessment & Plan Note (Signed)
 Chronic myelogenous leukemia-low risk disease [Sokal, has 4, ELTS] March 2025 BCR ABL QT PCR b2a2 transcript 0.0429, lost MR4.5, after stopping Gleevec  for 6 months.  12/07/2023, started on bosutinib 400 mg daily. 02/08/2024  MR 4.5 04/11/2024  Decrease Bosutinib to 300mg  daily due to diarrhea and Cr increase 06/13/2024 continue Bosutinib 300mg  daily

## 2024-06-13 NOTE — Progress Notes (Signed)
 Hematology/Oncology Progress note [phone] [phone]) [phone]     Chief Complaint: Chairty Toman is a 73 y.o.SABRA female with chronic phase CML who is seen for management of CML  ASSESSMENT & PLAN:   BCR/ABL1-positive chronic myeloid leukemia (CML) in remission (HCC) Chronic myelogenous leukemia-low risk disease [Sokal, has 4, ELTS] March 2025 BCR ABL QT PCR b2a2 transcript 0.0429, lost MR4.5, after stopping Gleevec  for 6 months.  12/07/2023, started on bosutinib 400 mg daily. 02/08/2024  MR 4.5 04/11/2024  Decrease Bosutinib to 300mg  daily due to diarrhea and Cr increase 06/13/2024 continue Bosutinib 300mg  daily   Skin rash Intermittent rash and dryness on lips and forearms, possibly related to allergies or environmental factors. Symptoms not severe and intermittent. Topical treatments beneficial. - Apply steroid cream to affected areas as needed. - Continue using Aquacel and ceramic wrap for moisture retention.    Orders Placed This Encounter  Procedures   BCR-ABL1 FISH    Standing Status:   Future    Expected Date:   09/13/2024    Expiration Date:   12/12/2024   BCR-ABL1, CML/ALL, PCR, QUANT    Standing Status:   Future    Expected Date:   09/13/2024    Expiration Date:   12/12/2024   CBC with Differential (Cancer Center Only)    Standing Status:   Future    Expected Date:   09/13/2024    Expiration Date:   12/12/2024   CMP (Cancer Center only)    Standing Status:   Future    Expected Date:   09/13/2024    Expiration Date:   12/12/2024   Follow-up 8 weeks all questions were answered. The patient knows to call the clinic with any problems, questions or concerns. We spent sufficient time to discuss many aspect of care, questions were answered to patient's satisfaction.  Zelphia Cap, MD, PhD [facility] Hematology Oncology 06/13/2024    .  PERTINENT ONCOLOGY HISTORY Li Wei Badon is a 73 y.o.afemale who has above oncology history reviewed by me today presented  for follow up visit for management of CML Patient previously followed up by Dr.Corcoran, patient switched care to me on 01/03/2021 Extensive medical record review was performed by me  10/09/2020 abnormal peripheral smear (3% myelocytes) 10/25/2020  lab work-up revealed a hematocrit of 40.8, hemoglobin 13.7, platelets 352,000, WBC 10,200. LDH was 174.  BCR-ABL showed 70% of nuclei positive for BCR/ABL1 fusion.  Flow cytometry revealed left shifted aberrant neutrophils, aberrant monocytes and absolute basophilia.   BCR-ABL1 revealed 123.6615% b2a2, < 0.0032% b3a2, and < 0.0032% E1A2 transcript. The ABL1 e13a2 (b2a2, p210) fusion transcript was positive.   11/05/2020 bone marrow biopsy showed  slightly hypercellular marrow with mild granulocytic proliferation and atypical megakaryocytes. The combined bone marrow and peripheral blood findings were very limited but were generally c/w early involvement by chronic myeloid leukemia, chronic phase especially in the presence of previously documented positivity for BCR/ABL1 fusion in peripheral blood analysis.  Cytogenetics revealed 4, XX, t(9;22)(q34.1; q11.2)[19] / 39, XX [1].  Molecular genetics showed a BCR-ABL1 translocation t(9;22) with 65.5684 % BCR-ABL1/ABL1 (IS), major breakpoint p210, log reduction 0.001.  11/16/2020 abdominal ultrasound showed no splenomegaly (7.2 x 9.5 x 4.4 cm).   Relative risk (RR) Sokal 0.81 (intermediate) and Hasford 684.83 (low) based on age (41), spleen (0), platelets (309), basophils (6%), eosinophils (3%), and myeloblasts (occ-1%). ELTS risk score 1.5596 (low risk) based on 0% blasts and 1.6648 (intermediate-risk) based on 1% blasts.  11/28/2020 -November 2025 Gleevec  400  mg daily.  Stopped Gleevec  after molecular remission for 2 years.  March 2025, lost molecular remission  INTERVAL HISTORY Tishanna Dunford is a 73 y.o. female who has above history reviewed by me today presents for follow up visit for management of  CML 12/07/2023 started on bosutinib 400 mg daily. Discussed the use of AI scribe software for clinical note transcription with the patient, who gave verbal consent to proceed.   She has been experiencing a rash over the last two to three months, primarily affecting her lips and forearms. Her lips feel very dry, akin to 'paper rubbed on the lips,' and the rash occurs approximately once a week. It is not severe or itchy, but she has noticed some cuts on her forearms and elbows without a clear cause. She has used steroid cream in the past with uncertain results.  She has noticed changes in her toenails, which have turned black and are not aesthetically pleasing. She practices Tai Chi for a couple of hours daily.      Past Medical History:  Diagnosis Date   Allergy    Anemia due to antineoplastic chemotherapy 05/06/2022   Anxiety    Arthritis 2010   Chronic myeloid leukemia, BCR/ABL-positive, not having achieved remission (HCC) 11/17/2020   Depression    Diabetes mellitus without complication (HCC)    GERD (gastroesophageal reflux disease)    Hyperlipidemia    Hypertension     Past Surgical History:  Procedure Laterality Date   CYST EXCISION Left 2017   foot   EYE SURGERY  11/16/1996   myopia laser surgery   HAND SURGERY  07/2012   MASS EXCISION Right 08/13/2021   Procedure: EXCISION LOWER LIP MASS;  Surgeon: Blair Mt, MD;  Location: [facility];  Service: ENT;  Laterality: Right;  Diabetic    Family History  Problem Relation Age of Onset   Hypertension Mother    Hyperlipidemia Mother    Arthritis Mother    Parkinsonism Father    Arthritis Other    Hypertension Other    Hyperlipidemia Other    Hypothyroidism Other    Breast cancer Sister 54   Cancer Sister     Social History:  reports that she has never smoked. She has never used smokeless tobacco. She reports that she does not drink alcohol and does not use drugs.   Allergies:  Allergies  Allergen  Reactions   Dapagliflozin  Other (See Comments)   Clonazepam Other (See Comments)    Nocturnal enuresis; Lethargy    Trazodone Anxiety and Other (See Comments)    Dizziness     Current Medications: Current Outpatient Medications  Medication Sig Dispense Refill   acetaminophen  (TYLENOL ) 500 MG tablet Take 2 tablets (1,000 mg total) by mouth in the morning and at bedtime. 180 tablet 0   acyclovir  (ZOVIRAX ) 200 MG capsule Take 1 capsule (200 mg total) by mouth 5 (five) times daily. 90 capsule 1   amLODipine  (NORVASC ) 2.5 MG tablet Take 1 tablet (2.5 mg total) by mouth daily. 90 tablet 1   atenolol  (TENORMIN ) 50 MG tablet Take 1 tablet (50 mg total) by mouth daily. 90 tablet 1   atorvastatin  (LIPITOR) 10 MG tablet Take 1 tablet (10 mg total) by mouth at bedtime. 90 tablet 1   Coenzyme Q10 (CO Q 10) 100 MG CAPS Take 2 capsules by mouth daily.     estazolam  (PROSOM ) 2 MG tablet TAKE 1 TABLET BY MOUTH EVERYDAY AT BEDTIME 30 tablet 5   glucose  blood (FREESTYLE LITE) test strip Use to test BS twice daily 100 each 4   hydrochlorothiazide  (HYDRODIURIL ) 25 MG tablet TAKE 1 TABLET (25 MG TOTAL) BY MOUTH DAILY. 90 tablet 1   hydrocortisone  cream 0.5 % Apply 1 application topically 2 (two) times daily. Apply to periorbital area twice daily. 28 g 1   Lancets (FREESTYLE) lancets Use to test BS once daily 100 each 12   loperamide  (IMODIUM ) 2 MG capsule Take 1 capsule (2 mg total) by mouth See admin instructions. Initial: 4 mg,the 2 mg every 2 hours (4 mg every 4 hours at night) until diarrhea stops.maximum: 16 mg/day 60 capsule 2   Lutein 20 MG TABS Take 1 tablet by mouth daily.      metFORMIN  (GLUCOPHAGE ) 1000 MG tablet Take 1 tablet (1,000 mg total) by mouth 2 (two) times daily with a meal. 180 tablet 3   MULTIPLE VITAMIN PO 1 tablet daily.      olopatadine (PATANOL) 0.1 % ophthalmic solution Apply to eye.     Omega-3 Fatty Acids (FISH OIL PEARLS PO) Take by mouth.     ondansetron  (ZOFRAN ) 4 MG tablet  Take 1 tablet (4 mg total) by mouth every 8 (eight) hours as needed for nausea or vomiting. 20 tablet 0   PARoxetine  (PAXIL ) 20 MG tablet Take 1 tablet (20 mg total) by mouth daily. 90 tablet 3   prednisoLONE acetate (PRED FORTE) 1 % ophthalmic suspension Place 1 drop into the right eye 4 (four) times daily.     PREVIDENT 5000 PLUS 1.1 % CREA dental cream Place 1 Application onto teeth as directed.     SELENIUM PO Take by mouth daily.     sitaGLIPtin  (JANUVIA ) 100 MG tablet Take 1 tablet (100 mg total) by mouth daily. 90 tablet 3   triamcinolone  cream (KENALOG ) 0.5 % APPLY 1 APPLICATION TOPICALLY 3 (THREE) TIMES DAILY. TO RASH ON ABDOMEN 30 g 0   valsartan  (DIOVAN ) 320 MG tablet Take 1 tablet (320 mg total) by mouth daily. 90 tablet 3   Vitamin D , Ergocalciferol , (DRISDOL) 1.25 MG (50000 UNIT) CAPS capsule Take 50,000 Units by mouth every 7 (seven) days.     vitamin E  (VITAMIN E ) 400 UNIT capsule Take 1 capsule (400 Units total) by mouth daily. 1 capsule 0   zinc gluconate 50 MG tablet Take 50 mg by mouth daily.     bosutinib (BOSULIF ) 100 MG tablet Take 3 tablets (300 mg total) by mouth daily with breakfast. Take with food. 90 tablet 3   potassium chloride  (KLOR-CON  M) 10 MEQ tablet Take 1 tablet (10 mEq total) by mouth daily as needed (take on days you have diarrhea.). 90 tablet 1   No current facility-administered medications for this visit.    Review of Systems  Constitutional:  Negative for chills, fever, malaise/fatigue and weight loss.  HENT:  Negative for sore throat.   Eyes:  Negative for redness.  Respiratory:  Negative for cough, shortness of breath and wheezing.   Cardiovascular:  Negative for chest pain, palpitations and leg swelling.  Gastrointestinal:  Negative for abdominal pain, blood in stool, nausea and vomiting.  Genitourinary:  Negative for dysuria.  Musculoskeletal:  Positive for joint pain.  Skin:  Positive for rash.  Neurological:  Negative for dizziness, tingling  and tremors.  Endo/Heme/Allergies:  Does not bruise/bleed easily.  Psychiatric/Behavioral:  Negative for hallucinations.    Performance status (ECOG): 0 Vitals Blood pressure 128/60, pulse 60, temperature 97.7 F (36.5 C), temperature source Tympanic,  resp. rate 16, weight 143 lb 4.8 oz (65 kg), SpO2 100%.  Physical Exam Constitutional:      General: She is not in acute distress.    Appearance: She is not diaphoretic.  HENT:     Head: Normocephalic and atraumatic.  Eyes:     General: No scleral icterus. Cardiovascular:     Rate and Rhythm: Normal rate.  Pulmonary:     Effort: Pulmonary effort is normal. No respiratory distress.  Abdominal:     General: There is no distension.  Musculoskeletal:        General: Normal range of motion.     Cervical back: Normal range of motion and neck supple.  Skin:    Findings: No erythema.  Neurological:     Mental Status: She is alert and oriented to person, place, and time. Mental status is at baseline.     Motor: No abnormal muscle tone.  Psychiatric:        Mood and Affect: Mood and affect normal.

## 2024-06-17 LAB — BCR-ABL1 FISH
Cells Analyzed: 200
Cells Counted: 200

## 2024-06-17 LAB — BCR-ABL1, CML/ALL, PCR, QUANT
E1A2 Transcript: <0.0032 %
Interpretation (BCRAL):: NEGATIVE
b2a2 transcript: <0.0032 %
b3a2 transcript: <0.0032 %

## 2024-07-29 ENCOUNTER — Ambulatory Visit: Admitting: Internal Medicine

## 2024-07-29 VITALS — BP 122/74 | HR 72 | Ht 64.0 in | Wt 141.0 lb

## 2024-07-29 DIAGNOSIS — E118 Type 2 diabetes mellitus with unspecified complications: Secondary | ICD-10-CM

## 2024-07-29 DIAGNOSIS — I10 Essential (primary) hypertension: Secondary | ICD-10-CM | POA: Diagnosis not present

## 2024-07-29 DIAGNOSIS — Z7984 Long term (current) use of oral hypoglycemic drugs: Secondary | ICD-10-CM

## 2024-07-29 DIAGNOSIS — E785 Hyperlipidemia, unspecified: Secondary | ICD-10-CM | POA: Diagnosis not present

## 2024-07-29 DIAGNOSIS — F324 Major depressive disorder, single episode, in partial remission: Secondary | ICD-10-CM | POA: Diagnosis not present

## 2024-07-29 DIAGNOSIS — A6000 Herpesviral infection of urogenital system, unspecified: Secondary | ICD-10-CM | POA: Diagnosis not present

## 2024-07-29 DIAGNOSIS — E1169 Type 2 diabetes mellitus with other specified complication: Secondary | ICD-10-CM | POA: Diagnosis not present

## 2024-07-29 LAB — POCT GLYCOSYLATED HEMOGLOBIN (HGB A1C): Hemoglobin A1C: 6 % — AB (ref 4.0–5.6)

## 2024-07-29 MED ORDER — ONDANSETRON HCL 4 MG PO TABS: 4.0000 mg | ORAL_TABLET | Freq: Three times a day (TID) | ORAL | 0 refills | Status: AC | PRN

## 2024-07-29 MED ORDER — SITAGLIPTIN PHOSPHATE 100 MG PO TABS: 100.0000 mg | ORAL_TABLET | Freq: Every day | ORAL | 1 refills | Status: AC

## 2024-07-29 MED ORDER — ATORVASTATIN CALCIUM 10 MG PO TABS: 10.0000 mg | ORAL_TABLET | Freq: Every day | ORAL | 1 refills | Status: AC

## 2024-07-29 MED ORDER — METFORMIN HCL 1000 MG PO TABS: 1000.0000 mg | ORAL_TABLET | Freq: Two times a day (BID) | ORAL | 1 refills | Status: AC

## 2024-07-29 MED ORDER — VALSARTAN 320 MG PO TABS: 320.0000 mg | ORAL_TABLET | Freq: Every day | ORAL | 3 refills | Status: AC

## 2024-07-29 MED ORDER — HYDROCHLOROTHIAZIDE 25 MG PO TABS: 25.0000 mg | ORAL_TABLET | Freq: Every day | ORAL | 1 refills | Status: AC

## 2024-07-29 MED ORDER — FREESTYLE LITE TEST VI STRP: ORAL_STRIP | 4 refills | Status: AC

## 2024-07-29 MED ORDER — ACYCLOVIR 200 MG PO CAPS: 200.0000 mg | ORAL_CAPSULE | Freq: Every day | ORAL | 1 refills | Status: AC

## 2024-07-29 MED ORDER — AMLODIPINE BESYLATE 2.5 MG PO TABS: 2.5000 mg | ORAL_TABLET | Freq: Every day | ORAL | 1 refills | Status: AC

## 2024-07-29 MED ORDER — PAROXETINE HCL 20 MG PO TABS: 20.0000 mg | ORAL_TABLET | Freq: Every day | ORAL | 1 refills | Status: AC

## 2024-07-29 MED ORDER — ATENOLOL 50 MG PO TABS: 50.0000 mg | ORAL_TABLET | Freq: Every day | ORAL | 1 refills | Status: AC

## 2024-07-29 NOTE — Progress Notes (Signed)
 Date:  07/29/2024   Name:  Jenna Stein   DOB:  01/13/1951   MRN:  [id number]   Chief Complaint: Diabetes  Diabetes She presents for her follow-up diabetic visit. She has type 2 diabetes mellitus. Her disease course has been stable. Pertinent negatives for hypoglycemia include no headaches, nervousness/anxiousness or tremors. Pertinent negatives for diabetes include no chest pain, no fatigue, no polydipsia and no polyuria. Current diabetic treatments: MTF and Januvia . She is compliant with treatment all of the time.  Hypertension This is a chronic problem. The problem is controlled. Pertinent negatives include no chest pain, headaches, palpitations or shortness of breath.    Review of Systems  Constitutional:  Negative for appetite change, fatigue, fever and unexpected weight change.  HENT:  Negative for tinnitus and trouble swallowing.   Eyes:  Negative for visual disturbance.  Respiratory:  Negative for cough, chest tightness and shortness of breath.   Cardiovascular:  Negative for chest pain, palpitations and leg swelling.  Gastrointestinal:  Positive for constipation. Negative for abdominal pain.  Endocrine: Negative for polydipsia and polyuria.  Genitourinary:  Negative for dysuria and hematuria.  Musculoskeletal:  Negative for arthralgias.  Neurological:  Negative for tremors, numbness and headaches.  Psychiatric/Behavioral:  Negative for dysphoric mood and sleep disturbance. The patient is not nervous/anxious.      Lab Results  Component Value Date   NA 132 (L) 06/13/2024   K 3.6 06/13/2024   CO2 23 06/13/2024   GLUCOSE 128 (H) 06/13/2024   BUN 18 06/13/2024   CREATININE 0.95 06/13/2024   CALCIUM  9.4 06/13/2024   EGFR 59 (L) 11/03/2022   GFRNONAA >60 06/13/2024   Lab Results  Component Value Date   CHOL 147 11/11/2023   HDL 48 11/11/2023   LDLCALC 76 11/11/2023   TRIG 130 11/11/2023   CHOLHDL 3.1 11/11/2023   Lab Results  Component Value Date   TSH 2.850  11/11/2023   Lab Results  Component Value Date   HGBA1C 6.0 (A) 07/29/2024   Lab Results  Component Value Date   WBC 6.5 06/13/2024   HGB 12.1 06/13/2024   HCT 35.6 (L) 06/13/2024   MCV 87.3 06/13/2024   PLT 265 06/13/2024   Lab Results  Component Value Date   ALT 19 06/13/2024   AST 25 06/13/2024   ALKPHOS 55 06/13/2024   BILITOT 0.7 06/13/2024   Lab Results  Component Value Date   VD25OH 56.3 11/11/2023     Patient Active Problem List   Diagnosis Date Noted   Skin rash 06/13/2024   Carpal tunnel syndrome of right wrist 11/11/2023   Elevated serum creatinine 12/02/2022   Encounter for antineoplastic chemotherapy 05/06/2022   Chemotherapy induced diarrhea 05/06/2022   Hypokalemia 05/06/2022   Chemotherapy-induced neutropenia 10/24/2021   Gastroesophageal reflux disease with esophagitis 10/24/2021   BCR/ABL1-positive chronic myeloid leukemia (CML) in remission (HCC) 11/17/2020   Abnormal blood smear 10/25/2020   Type II diabetes mellitus with complication (HCC) 02/10/2017   Dry mouth 10/10/2016   Osteoarthritis of both hands 04/30/2015   Genital herpes 04/04/2009   Depression, major, single episode, in partial remission 08/18/1998   Essential hypertension 08/18/1998   Hyperlipidemia associated with type 2 diabetes mellitus (HCC) 08/18/1998   Primary insomnia 08/18/1998    Allergies[1]  Past Surgical History:  Procedure Laterality Date   CYST EXCISION Left 2017   foot   EYE SURGERY  11/16/1996   myopia laser surgery   HAND SURGERY  07/2012   MASS  EXCISION Right 08/13/2021   Procedure: EXCISION LOWER LIP MASS;  Surgeon: Blair Mt, MD;  Location: [facility];  Service: ENT;  Laterality: Right;  Diabetic    Social History[2]   Medication list has been reviewed and updated.  Active Medications[3]     07/29/2024    2:15 PM 03/28/2024    2:13 PM 06/15/2023    1:26 PM 08/19/2022    3:20 PM  GAD 7 : Generalized Anxiety Score  Nervous, Anxious,  on Edge 0 0 0 0  Control/stop worrying 0 0 0 0  Worry too much - different things 0 0 0 0  Trouble relaxing 0 0 0 0  Restless 0 0 0 0  Easily annoyed or irritable 0 0 0 0  Afraid - awful might happen 0 0 0 0  Total GAD 7 Score 0 0 0 0  Anxiety Difficulty Not difficult at all Not difficult at all Not difficult at all Not difficult at all       07/29/2024    2:15 PM 06/13/2024    1:42 PM 04/11/2024    1:11 PM  Depression screen PHQ 2/9  Decreased Interest 0 0 0  Down, Depressed, Hopeless 0 0 0  PHQ - 2 Score 0 0 0  Altered sleeping 0 0 0  Tired, decreased energy 0 0 0  Change in appetite 0 0 0  Feeling bad or failure about yourself  0 0 0  Trouble concentrating 0 0 0  Moving slowly or fidgety/restless 0 0 0  Suicidal thoughts 0 0 0  PHQ-9 Score 0 0  0   Difficult doing work/chores Not difficult at all       Data saved with a previous flowsheet row definition    BP Readings from Last 3 Encounters:  07/29/24 122/74  06/13/24 128/60  04/11/24 124/63    Physical Exam Vitals and nursing note reviewed.  Constitutional:      General: She is not in acute distress.    Appearance: She is well-developed.  HENT:     Head: Normocephalic and atraumatic.  Neck:     Vascular: No carotid bruit.  Cardiovascular:     Rate and Rhythm: Normal rate and regular rhythm.     Heart sounds: No murmur heard. Pulmonary:     Effort: Pulmonary effort is normal. No respiratory distress.     Breath sounds: No wheezing or rhonchi.  Musculoskeletal:     Cervical back: Normal range of motion.     Right lower leg: No edema.     Left lower leg: No edema.  Lymphadenopathy:     Cervical: No cervical adenopathy.  Skin:    General: Skin is warm and dry.     Findings: No rash.  Neurological:     Mental Status: She is alert and oriented to person, place, and time.  Psychiatric:        Mood and Affect: Mood normal.        Behavior: Behavior normal.     Wt Readings from Last 3 Encounters:   07/29/24 141 lb (64 kg)  06/13/24 143 lb 4.8 oz (65 kg)  04/11/24 140 lb (63.5 kg)    BP 122/74   Pulse 72   Ht 5' 4 (1.626 m)   Wt 141 lb (64 kg)   SpO2 97%   BMI 24.20 kg/m   Assessment and Plan:  Problem List Items Addressed This Visit       Unprioritized   Depression, major,  single episode, in partial remission (Chronic)   Doing well on Paxil .      Relevant Medications   PARoxetine  (PAXIL ) 20 MG tablet   Genital herpes   Relevant Medications   acyclovir  (ZOVIRAX ) 200 MG capsule   Essential hypertension (Chronic)   Well controlled blood pressure today. Current regimen is valsartan  and hydrochlorothiazide . No medication side effects noted.        Relevant Medications   valsartan  (DIOVAN ) 320 MG tablet   atenolol  (TENORMIN ) 50 MG tablet   atorvastatin  (LIPITOR) 10 MG tablet   hydrochlorothiazide  (HYDRODIURIL ) 25 MG tablet   amLODipine  (NORVASC ) 2.5 MG tablet   Hyperlipidemia associated with type 2 diabetes mellitus (HCC) (Chronic)   Tolerating atorvastatin  10 mg. Lab Results  Component Value Date   LDLCALC 76 11/11/2023         Relevant Medications   valsartan  (DIOVAN ) 320 MG tablet   atenolol  (TENORMIN ) 50 MG tablet   atorvastatin  (LIPITOR) 10 MG tablet   hydrochlorothiazide  (HYDRODIURIL ) 25 MG tablet   amLODipine  (NORVASC ) 2.5 MG tablet   metFORMIN  (GLUCOPHAGE ) 1000 MG tablet   sitaGLIPtin  (JANUVIA ) 100 MG tablet   Type II diabetes mellitus with complication (HCC) - Primary (Chronic)   BS well controlled with MTF and Januvia . Last A1C was 5.6. A1C today = 6.0      Relevant Medications   valsartan  (DIOVAN ) 320 MG tablet   atorvastatin  (LIPITOR) 10 MG tablet   glucose blood (FREESTYLE LITE) test strip   metFORMIN  (GLUCOPHAGE ) 1000 MG tablet   sitaGLIPtin  (JANUVIA ) 100 MG tablet   Other Relevant Orders   POCT glycosylated hemoglobin (Hb A1C) (Completed)   Other Visit Diagnoses       Long term current use of oral hypoglycemic drug            Return in about 4 months (around 11/27/2024) for Medicare annual.    Leita HILARIO Adie, MD [facility] [place]           [1]  Allergies Allergen Reactions   Dapagliflozin  Other (See Comments)   Clonazepam Other (See Comments)    Nocturnal enuresis; Lethargy    Trazodone Anxiety and Other (See Comments)    Dizziness   [2]  Social History Tobacco Use   Smoking status: Never   Smokeless tobacco: Never   Tobacco comments:    smoking cessation materials not required  Vaping Use   Vaping status: Never Used  Substance Use Topics   Alcohol use: No   Drug use: No  [3]  Current Meds  Medication Sig   acetaminophen  (TYLENOL ) 500 MG tablet Take 2 tablets (1,000 mg total) by mouth in the morning and at bedtime.   bosutinib (BOSULIF ) 100 MG tablet Take 3 tablets (300 mg total) by mouth daily with breakfast. Take with food.   Coenzyme Q10 (CO Q 10) 100 MG CAPS Take 2 capsules by mouth daily.   estazolam  (PROSOM ) 2 MG tablet TAKE 1 TABLET BY MOUTH EVERYDAY AT BEDTIME   hydrocortisone  cream 0.5 % Apply 1 application topically 2 (two) times daily. Apply to periorbital area twice daily.   Lancets (FREESTYLE) lancets Use to test BS once daily   loperamide  (IMODIUM ) 2 MG capsule Take 1 capsule (2 mg total) by mouth See admin instructions. Initial: 4 mg,the 2 mg every 2 hours (4 mg every 4 hours at night) until diarrhea stops.maximum: 16 mg/day   Lutein 20 MG TABS Take 1 tablet by mouth daily.    MULTIPLE  VITAMIN PO 1 tablet daily.    olopatadine (PATANOL) 0.1 % ophthalmic solution Apply to eye.   Omega-3 Fatty Acids (FISH OIL PEARLS PO) Take by mouth.   prednisoLONE acetate (PRED FORTE) 1 % ophthalmic suspension Place 1 drop into the right eye 4 (four) times daily.   PREVIDENT 5000 PLUS 1.1 % CREA dental cream Place 1 Application onto teeth as directed.   SELENIUM PO Take by mouth daily.   triamcinolone  cream (KENALOG ) 0.5 % APPLY 1 APPLICATION  TOPICALLY 3 (THREE) TIMES DAILY. TO RASH ON ABDOMEN   Vitamin D , Ergocalciferol , (DRISDOL) 1.25 MG (50000 UNIT) CAPS capsule Take 50,000 Units by mouth every 7 (seven) days.   vitamin E  (VITAMIN E ) 400 UNIT capsule Take 1 capsule (400 Units total) by mouth daily.   zinc gluconate 50 MG tablet Take 50 mg by mouth daily.   [DISCONTINUED] acyclovir  (ZOVIRAX ) 200 MG capsule Take 1 capsule (200 mg total) by mouth 5 (five) times daily.   [DISCONTINUED] amLODipine  (NORVASC ) 2.5 MG tablet Take 1 tablet (2.5 mg total) by mouth daily.   [DISCONTINUED] atenolol  (TENORMIN ) 50 MG tablet Take 1 tablet (50 mg total) by mouth daily.   [DISCONTINUED] atorvastatin  (LIPITOR) 10 MG tablet Take 1 tablet (10 mg total) by mouth at bedtime.   [DISCONTINUED] glucose blood (FREESTYLE LITE) test strip Use to test BS twice daily   [DISCONTINUED] hydrochlorothiazide  (HYDRODIURIL ) 25 MG tablet TAKE 1 TABLET (25 MG TOTAL) BY MOUTH DAILY.   [DISCONTINUED] metFORMIN  (GLUCOPHAGE ) 1000 MG tablet Take 1 tablet (1,000 mg total) by mouth 2 (two) times daily with a meal.   [DISCONTINUED] ondansetron  (ZOFRAN ) 4 MG tablet Take 1 tablet (4 mg total) by mouth every 8 (eight) hours as needed for nausea or vomiting.   [DISCONTINUED] PARoxetine  (PAXIL ) 20 MG tablet Take 1 tablet (20 mg total) by mouth daily.   [DISCONTINUED] sitaGLIPtin  (JANUVIA ) 100 MG tablet Take 1 tablet (100 mg total) by mouth daily.   [DISCONTINUED] valsartan  (DIOVAN ) 320 MG tablet Take 1 tablet (320 mg total) by mouth daily.

## 2024-07-29 NOTE — Assessment & Plan Note (Signed)
 Doing well on Paxil

## 2024-07-29 NOTE — Assessment & Plan Note (Addendum)
 BS well controlled with MTF and Januvia . Last A1C was 5.6. A1C today = 6.0

## 2024-07-29 NOTE — Assessment & Plan Note (Signed)
 Tolerating atorvastatin  10 mg. Lab Results  Component Value Date   LDLCALC 76 11/11/2023

## 2024-07-29 NOTE — Assessment & Plan Note (Signed)
 Well controlled blood pressure today. Current regimen is valsartan and hydrochlorothiazide . No medication side effects noted.

## 2024-09-13 ENCOUNTER — Inpatient Hospital Stay: Admitting: Oncology

## 2024-09-13 ENCOUNTER — Encounter: Payer: Self-pay | Admitting: Oncology

## 2024-09-13 ENCOUNTER — Inpatient Hospital Stay: Attending: Oncology

## 2024-09-13 VITALS — BP 138/72 | HR 64 | Temp 97.3°F | Resp 18 | Wt 145.8 lb

## 2024-09-13 DIAGNOSIS — Z79899 Other long term (current) drug therapy: Secondary | ICD-10-CM | POA: Diagnosis not present

## 2024-09-13 DIAGNOSIS — K5900 Constipation, unspecified: Secondary | ICD-10-CM | POA: Diagnosis not present

## 2024-09-13 DIAGNOSIS — C9211 Chronic myeloid leukemia, BCR/ABL-positive, in remission: Secondary | ICD-10-CM | POA: Insufficient documentation

## 2024-09-13 LAB — CMP (CANCER CENTER ONLY)
ALT: 18 U/L (ref 0–44)
AST: 24 U/L (ref 15–41)
Albumin: 4.8 g/dL (ref 3.5–5.0)
Alkaline Phosphatase: 61 U/L (ref 38–126)
Anion gap: 13 (ref 5–15)
BUN: 15 mg/dL (ref 8–23)
CO2: 24 mmol/L (ref 22–32)
Calcium: 10.1 mg/dL (ref 8.9–10.3)
Chloride: 98 mmol/L (ref 98–111)
Creatinine: 0.94 mg/dL (ref 0.44–1.00)
GFR, Estimated: >60 mL/min
Glucose, Bld: 129 mg/dL — ABNORMAL HIGH (ref 70–99)
Potassium: 3.7 mmol/L (ref 3.5–5.1)
Sodium: 135 mmol/L (ref 135–145)
Total Bilirubin: 0.6 mg/dL (ref 0.0–1.2)
Total Protein: 7.9 g/dL (ref 6.5–8.1)

## 2024-09-13 LAB — CBC WITH DIFFERENTIAL (CANCER CENTER ONLY)
Abs Immature Granulocytes: 0.02 10*3/uL (ref 0.00–0.07)
Basophils Absolute: 0.1 10*3/uL (ref 0.0–0.1)
Basophils Relative: 1 %
Eosinophils Absolute: 0.5 10*3/uL (ref 0.0–0.5)
Eosinophils Relative: 7 %
HCT: 36.8 % (ref 36.0–46.0)
Hemoglobin: 12.8 g/dL (ref 12.0–15.0)
Immature Granulocytes: 0 %
Lymphocytes Relative: 37 %
Lymphs Abs: 2.6 10*3/uL (ref 0.7–4.0)
MCH: 30.5 pg (ref 26.0–34.0)
MCHC: 34.8 g/dL (ref 30.0–36.0)
MCV: 87.6 fL (ref 80.0–100.0)
Monocytes Absolute: 0.5 10*3/uL (ref 0.1–1.0)
Monocytes Relative: 7 %
Neutro Abs: 3.4 10*3/uL (ref 1.7–7.7)
Neutrophils Relative %: 48 %
Platelet Count: 245 10*3/uL (ref 150–400)
RBC: 4.2 MIL/uL (ref 3.87–5.11)
RDW: 13 % (ref 11.5–15.5)
WBC Count: 7 10*3/uL (ref 4.0–10.5)
nRBC: 0 % (ref 0.0–0.2)

## 2024-09-13 MED ORDER — BOSUTINIB 100 MG PO TABS: 300.0000 mg | ORAL_TABLET | Freq: Every day | ORAL | 3 refills | Status: AC

## 2024-09-13 NOTE — Assessment & Plan Note (Signed)
 Recommend her to increase Colace to 100 mg twice daily.  If does not work, recommend switch to Senokot S 2 tablet daily.

## 2024-09-13 NOTE — Assessment & Plan Note (Signed)
 Chronic myelogenous leukemia-low risk disease [Sokal, has 4, ELTS] March 2025 BCR ABL QT PCR b2a2 transcript 0.0429, lost MR4.5, after stopping Gleevec  for 6 months.  12/07/2023, started on bosutinib 400 mg daily. 02/08/2024  MR 4.5 04/11/2024  Decrease Bosutinib to 300mg  daily due to diarrhea and Cr increase 06/13/2024 continue Bosutinib 300mg  daily

## 2024-09-13 NOTE — Progress Notes (Signed)
 Pt here for follow up. No new concerns voiced.

## 2024-09-13 NOTE — Progress Notes (Signed)
 " Hematology/Oncology Progress note [phone] [phone]     Chief Complaint: Abeera Flannery is a 74 y.o.SABRA female with chronic phase CML who is seen for management of CML  ASSESSMENT & PLAN:   BCR/ABL1-positive chronic myeloid leukemia (CML) in remission (HCC) Chronic myelogenous leukemia-low risk disease [Sokal, has 4, ELTS] March 2025 BCR ABL QT PCR b2a2 transcript 0.0429, lost MR4.5, after stopping Gleevec  for 6 months.  12/07/2023, started on bosutinib 400 mg daily. 02/08/2024  MR 4.5 04/11/2024  Decrease Bosutinib to 300mg  daily due to diarrhea and Cr increase 06/13/2024 continue Bosutinib 300mg  daily   Constipation Recommend her to increase Colace to 100 mg twice daily.  If does not work, recommend switch to Senokot S 2 tablet daily.    Orders Placed This Encounter  Procedures   BCR-ABL1, CML/ALL, PCR, QUANT    Standing Status:   Future    Expected Date:   12/12/2024    Expiration Date:   03/12/2025   BCR-ABL1 FISH    Standing Status:   Future    Expected Date:   12/12/2024    Expiration Date:   03/12/2025   CMP (Cancer Center only)    Standing Status:   Future    Expected Date:   12/12/2024    Expiration Date:   03/12/2025   CBC with Differential (Cancer Center Only)    Standing Status:   Future    Expected Date:   12/12/2024    Expiration Date:   03/12/2025   Follow-up 3 months all questions were answered. The patient knows to call the clinic with any problems, questions or concerns. We spent sufficient time to discuss many aspect of care, questions were answered to patient's satisfaction.  Zelphia Cap, MD, PhD [facility] 09/13/2024    .  PERTINENT ONCOLOGY HISTORY Li Wei Fandrich is a 74 y.o.afemale who has above oncology history reviewed by me today presented for follow up visit for management of CML Patient previously followed up by Dr.Corcoran, patient switched care to me on 01/03/2021 Extensive medical record review was  performed by me  10/09/2020 abnormal peripheral smear (3% myelocytes) 10/25/2020  lab work-up revealed a hematocrit of 40.8, hemoglobin 13.7, platelets 352,000, WBC 10,200. LDH was 174.  BCR-ABL showed 70% of nuclei positive for BCR/ABL1 fusion.  Flow cytometry revealed left shifted aberrant neutrophils, aberrant monocytes and absolute basophilia.   BCR-ABL1 revealed 123.6615% b2a2, < 0.0032% b3a2, and < 0.0032% E1A2 transcript. The ABL1 e13a2 (b2a2, p210) fusion transcript was positive.   11/05/2020 bone marrow biopsy showed  slightly hypercellular marrow with mild granulocytic proliferation and atypical megakaryocytes. The combined bone marrow and peripheral blood findings were very limited but were generally c/w early involvement by chronic myeloid leukemia, chronic phase especially in the presence of previously documented positivity for BCR/ABL1 fusion in peripheral blood analysis.  Cytogenetics revealed 63, XX, t(9;22)(q34.1; q11.2)[19] / 50, XX [1].  Molecular genetics showed a BCR-ABL1 translocation t(9;22) with 65.5684 % BCR-ABL1/ABL1 (IS), major breakpoint p210, log reduction 0.001.  11/16/2020 abdominal ultrasound showed no splenomegaly (7.2 x 9.5 x 4.4 cm).   Relative risk (RR) Sokal 0.81 (intermediate) and Hasford 684.83 (low) based on age (61), spleen (0), platelets (309), basophils (6%), eosinophils (3%), and myeloblasts (occ-1%). ELTS risk score 1.5596 (low risk) based on 0% blasts and 1.6648 (intermediate-risk) based on 1% blasts.  11/28/2020 -November 2025 Gleevec  400 mg daily.  Stopped Gleevec  after molecular remission for 2 years.  March 2025, lost molecular remission  INTERVAL  HISTORY Mayzee Reichenbach is a 74 y.o. female who has above history reviewed by me today presents for follow up visit for management of CML 12/07/2023 started on bosutinib 400 mg daily. Discussed the use of AI scribe software for clinical note transcription with the patient, who gave verbal consent to proceed.    She reports feeling well.  No new complaints except chronic constipation.  She uses Colace 100 mg daily without symptom relief. She practices Tai Chi for a couple of hours daily.      Past Medical History:  Diagnosis Date   Allergy    Anemia due to antineoplastic chemotherapy 05/06/2022   Anxiety    Arthritis 2010   Chronic myeloid leukemia, BCR/ABL-positive, not having achieved remission (HCC) 11/17/2020   Depression    Diabetes mellitus without complication (HCC)    GERD (gastroesophageal reflux disease)    Hyperlipidemia    Hypertension     Past Surgical History:  Procedure Laterality Date   CYST EXCISION Left 2017   foot   EYE SURGERY  11/16/1996   myopia laser surgery   HAND SURGERY  07/2012   MASS EXCISION Right 08/13/2021   Procedure: EXCISION LOWER LIP MASS;  Surgeon: Blair Mt, MD;  Location: [facility];  Service: ENT;  Laterality: Right;  Diabetic    Family History  Problem Relation Age of Onset   Hypertension Mother    Hyperlipidemia Mother    Arthritis Mother    Parkinsonism Father    Arthritis Other    Hypertension Other    Hyperlipidemia Other    Hypothyroidism Other    Breast cancer Sister 44   Cancer Sister     Social History:  reports that she has never smoked. She has never used smokeless tobacco. She reports that she does not drink alcohol and does not use drugs.   Allergies:  Allergies  Allergen Reactions   Dapagliflozin  Other (See Comments)   Clonazepam Other (See Comments)    Nocturnal enuresis; Lethargy    Trazodone Anxiety and Other (See Comments)    Dizziness     Current Medications: Current Outpatient Medications  Medication Sig Dispense Refill   acetaminophen  (TYLENOL ) 500 MG tablet Take 2 tablets (1,000 mg total) by mouth in the morning and at bedtime. 180 tablet 0   acyclovir  (ZOVIRAX ) 200 MG capsule Take 1 capsule (200 mg total) by mouth 5 (five) times daily. 90 capsule 1   amLODipine  (NORVASC ) 2.5 MG  tablet Take 1 tablet (2.5 mg total) by mouth daily. 90 tablet 1   atenolol  (TENORMIN ) 50 MG tablet Take 1 tablet (50 mg total) by mouth daily. 90 tablet 1   atorvastatin  (LIPITOR) 10 MG tablet Take 1 tablet (10 mg total) by mouth at bedtime. 90 tablet 1   cholecalciferol (VITAMIN D3) 25 MCG (1000 UNIT) tablet Take 1,000 Units by mouth daily.     Coenzyme Q10 (CO Q 10) 100 MG CAPS Take 2 capsules by mouth daily.     estazolam  (PROSOM ) 2 MG tablet TAKE 1 TABLET BY MOUTH EVERYDAY AT BEDTIME 30 tablet 5   glucose blood (FREESTYLE LITE) test strip Use to test BS twice daily 100 each 4   hydrochlorothiazide  (HYDRODIURIL ) 25 MG tablet Take 1 tablet (25 mg total) by mouth daily. 90 tablet 1   hydrocortisone  cream 0.5 % Apply 1 application topically 2 (two) times daily. Apply to periorbital area twice daily. 28 g 1   Lancets (FREESTYLE) lancets Use to test BS once daily 100  each 12   Lutein 20 MG TABS Take 1 tablet by mouth daily.      metFORMIN  (GLUCOPHAGE ) 1000 MG tablet Take 1 tablet (1,000 mg total) by mouth 2 (two) times daily with a meal. 180 tablet 1   MULTIPLE VITAMIN PO 1 tablet daily.      olopatadine (PATANOL) 0.1 % ophthalmic solution Apply to eye.     Omega-3 Fatty Acids (FISH OIL PEARLS PO) Take by mouth.     ondansetron  (ZOFRAN ) 4 MG tablet Take 1 tablet (4 mg total) by mouth every 8 (eight) hours as needed for nausea or vomiting. 20 tablet 0   PARoxetine  (PAXIL ) 20 MG tablet Take 1 tablet (20 mg total) by mouth daily. 90 tablet 1   prednisoLONE acetate (PRED FORTE) 1 % ophthalmic suspension Place 1 drop into the right eye 4 (four) times daily.     PREVIDENT 5000 PLUS 1.1 % CREA dental cream Place 1 Application onto teeth as directed.     SELENIUM PO Take by mouth daily.     sitaGLIPtin  (JANUVIA ) 100 MG tablet Take 1 tablet (100 mg total) by mouth daily. 90 tablet 1   triamcinolone  cream (KENALOG ) 0.5 % APPLY 1 APPLICATION TOPICALLY 3 (THREE) TIMES DAILY. TO RASH ON ABDOMEN 30 g 0    valsartan  (DIOVAN ) 320 MG tablet Take 1 tablet (320 mg total) by mouth daily. 90 tablet 3   vitamin E  (VITAMIN E ) 400 UNIT capsule Take 1 capsule (400 Units total) by mouth daily. 1 capsule 0   zinc gluconate 50 MG tablet Take 50 mg by mouth daily.     bosutinib (BOSULIF ) 100 MG tablet Take 3 tablets (300 mg total) by mouth daily with breakfast. Take with food. 90 tablet 3   loperamide  (IMODIUM ) 2 MG capsule Take 1 capsule (2 mg total) by mouth See admin instructions. Initial: 4 mg,the 2 mg every 2 hours (4 mg every 4 hours at night) until diarrhea stops.maximum: 16 mg/day (Patient not taking: Reported on 09/13/2024) 60 capsule 2   No current facility-administered medications for this visit.    Review of Systems  Constitutional:  Negative for chills, fever, malaise/fatigue and weight loss.  HENT:  Negative for sore throat.   Eyes:  Negative for redness.  Respiratory:  Negative for cough, shortness of breath and wheezing.   Cardiovascular:  Negative for chest pain, palpitations and leg swelling.  Gastrointestinal:  Negative for abdominal pain, blood in stool, nausea and vomiting.  Genitourinary:  Negative for dysuria.  Musculoskeletal:  Positive for joint pain.  Skin:  Positive for rash.  Neurological:  Negative for dizziness, tingling and tremors.  Endo/Heme/Allergies:  Does not bruise/bleed easily.  Psychiatric/Behavioral:  Negative for hallucinations.    Performance status (ECOG): 0 Vitals Blood pressure 138/72, pulse 64, temperature (!) 97.3 F (36.3 C), resp. rate 18, weight 145 lb 12.8 oz (66.1 kg).  Physical Exam Constitutional:      General: She is not in acute distress.    Appearance: She is not diaphoretic.  HENT:     Head: Normocephalic and atraumatic.  Eyes:     General: No scleral icterus. Cardiovascular:     Rate and Rhythm: Normal rate.  Pulmonary:     Effort: Pulmonary effort is normal. No respiratory distress.  Abdominal:     General: There is no distension.   Musculoskeletal:        General: Normal range of motion.     Cervical back: Normal range of motion and neck supple.  Skin:    Findings: No erythema.  Neurological:     Mental Status: She is alert and oriented to person, place, and time. Mental status is at baseline.     Motor: No abnormal muscle tone.  Psychiatric:        Mood and Affect: Mood and affect normal.     "

## 2024-09-16 LAB — BCR-ABL1 FISH
Cells Analyzed: 200
Cells Counted: 200

## 2024-09-17 LAB — BCR-ABL1, CML/ALL, PCR, QUANT
E1A2 Transcript: <0.0032 %
Interpretation (BCRAL):: NEGATIVE
b2a2 transcript: <0.0032 %
b3a2 transcript: <0.0032 %

## 2024-10-06 ENCOUNTER — Encounter

## 2024-11-28 ENCOUNTER — Encounter: Admitting: Student

## 2024-12-12 ENCOUNTER — Inpatient Hospital Stay: Admitting: Oncology

## 2024-12-12 ENCOUNTER — Inpatient Hospital Stay
